# Patient Record
Sex: Female | Born: 1955
Health system: Southern US, Community
[De-identification: ages and names within clinical notes are randomized; demographics above are authoritative.]

## PROBLEM LIST (undated history)

## (undated) DIAGNOSIS — F32A Depression, unspecified: Secondary | ICD-10-CM

## (undated) DIAGNOSIS — F329 Major depressive disorder, single episode, unspecified: Secondary | ICD-10-CM

## (undated) DIAGNOSIS — J309 Allergic rhinitis, unspecified: Secondary | ICD-10-CM

## (undated) DIAGNOSIS — I10 Essential (primary) hypertension: Secondary | ICD-10-CM

## (undated) DIAGNOSIS — K219 Gastro-esophageal reflux disease without esophagitis: Secondary | ICD-10-CM

## (undated) DIAGNOSIS — N951 Menopausal and female climacteric states: Secondary | ICD-10-CM

## (undated) HISTORY — DX: Essential (primary) hypertension: I10

## (undated) HISTORY — DX: Major depressive disorder, single episode, unspecified: F32.9

## (undated) HISTORY — DX: Menopausal and female climacteric states: N95.1

## (undated) HISTORY — DX: Gastro-esophageal reflux disease without esophagitis: K21.9

## (undated) HISTORY — DX: Depression, unspecified: F32.A

## (undated) HISTORY — DX: Allergic rhinitis, unspecified: J30.9

---

## 1990-03-30 HISTORY — PX: OTHER SURGICAL HISTORY: SHX169

## 2008-08-23 ENCOUNTER — Ambulatory Visit: Payer: Self-pay

## 2009-02-07 ENCOUNTER — Ambulatory Visit: Payer: Self-pay | Admitting: Internal Medicine

## 2009-06-12 ENCOUNTER — Ambulatory Visit: Payer: Self-pay | Admitting: Gastroenterology

## 2009-06-21 ENCOUNTER — Ambulatory Visit: Payer: Self-pay | Admitting: Internal Medicine

## 2009-08-27 ENCOUNTER — Ambulatory Visit: Payer: Self-pay | Admitting: Internal Medicine

## 2009-08-29 ENCOUNTER — Ambulatory Visit: Payer: Self-pay | Admitting: Internal Medicine

## 2010-04-22 ENCOUNTER — Ambulatory Visit: Payer: Self-pay | Admitting: Internal Medicine

## 2010-10-30 ENCOUNTER — Ambulatory Visit: Payer: Self-pay | Admitting: Internal Medicine

## 2010-11-12 ENCOUNTER — Ambulatory Visit: Payer: Self-pay | Admitting: Internal Medicine

## 2010-11-17 ENCOUNTER — Ambulatory Visit: Payer: Self-pay | Admitting: Gastroenterology

## 2010-11-25 ENCOUNTER — Ambulatory Visit: Payer: Self-pay | Admitting: Internal Medicine

## 2011-02-13 ENCOUNTER — Ambulatory Visit: Payer: Self-pay | Admitting: Gastroenterology

## 2011-02-17 LAB — PATHOLOGY REPORT

## 2011-04-09 ENCOUNTER — Ambulatory Visit: Payer: Self-pay | Admitting: Internal Medicine

## 2011-08-25 ENCOUNTER — Ambulatory Visit: Payer: Self-pay

## 2011-09-28 ENCOUNTER — Ambulatory Visit: Payer: Self-pay

## 2011-12-23 ENCOUNTER — Ambulatory Visit: Payer: Self-pay | Admitting: Internal Medicine

## 2012-10-05 ENCOUNTER — Encounter: Payer: Self-pay | Admitting: Gynecology

## 2012-10-28 ENCOUNTER — Encounter: Payer: Self-pay | Admitting: Gynecology

## 2012-11-28 ENCOUNTER — Encounter: Payer: Self-pay | Admitting: Gynecology

## 2012-12-27 ENCOUNTER — Ambulatory Visit: Payer: Self-pay | Admitting: Internal Medicine

## 2014-01-18 ENCOUNTER — Ambulatory Visit: Payer: Self-pay | Admitting: Internal Medicine

## 2014-02-13 ENCOUNTER — Ambulatory Visit: Payer: Self-pay | Admitting: Internal Medicine

## 2014-03-30 HISTORY — PX: BREAST BIOPSY: SHX20

## 2014-07-13 ENCOUNTER — Other Ambulatory Visit: Payer: Self-pay | Admitting: Internal Medicine

## 2014-07-13 DIAGNOSIS — Z1231 Encounter for screening mammogram for malignant neoplasm of breast: Secondary | ICD-10-CM

## 2014-08-16 ENCOUNTER — Other Ambulatory Visit: Payer: Self-pay

## 2014-08-16 ENCOUNTER — Ambulatory Visit
Admission: RE | Admit: 2014-08-16 | Discharge: 2014-08-16 | Disposition: A | Discharge status: Home / Self Care | Payer: BLUE CROSS/BLUE SHIELD | Source: Ambulatory Visit | Attending: Internal Medicine | Admitting: Internal Medicine

## 2014-08-16 DIAGNOSIS — N63 Unspecified lump in breast: Secondary | ICD-10-CM | POA: Insufficient documentation

## 2014-08-16 DIAGNOSIS — Z1231 Encounter for screening mammogram for malignant neoplasm of breast: Secondary | ICD-10-CM

## 2014-08-16 DIAGNOSIS — R928 Other abnormal and inconclusive findings on diagnostic imaging of breast: Secondary | ICD-10-CM | POA: Diagnosis present

## 2014-08-20 ENCOUNTER — Other Ambulatory Visit: Payer: Self-pay | Admitting: Internal Medicine

## 2014-08-20 DIAGNOSIS — N63 Unspecified lump in unspecified breast: Secondary | ICD-10-CM

## 2014-08-20 DIAGNOSIS — R928 Other abnormal and inconclusive findings on diagnostic imaging of breast: Secondary | ICD-10-CM

## 2014-08-29 ENCOUNTER — Other Ambulatory Visit: Payer: Self-pay | Admitting: Internal Medicine

## 2014-08-29 DIAGNOSIS — N63 Unspecified lump in unspecified breast: Secondary | ICD-10-CM

## 2014-08-29 DIAGNOSIS — R928 Other abnormal and inconclusive findings on diagnostic imaging of breast: Secondary | ICD-10-CM

## 2014-09-03 ENCOUNTER — Other Ambulatory Visit: Payer: Self-pay | Admitting: Internal Medicine

## 2014-09-03 ENCOUNTER — Ambulatory Visit
Admission: RE | Admit: 2014-09-03 | Discharge: 2014-09-03 | Disposition: A | Discharge status: Home / Self Care | Payer: BLUE CROSS/BLUE SHIELD | Source: Ambulatory Visit | Attending: Internal Medicine | Admitting: Internal Medicine

## 2014-09-03 DIAGNOSIS — R928 Other abnormal and inconclusive findings on diagnostic imaging of breast: Secondary | ICD-10-CM

## 2014-09-03 DIAGNOSIS — N63 Unspecified lump in unspecified breast: Secondary | ICD-10-CM

## 2014-09-03 DIAGNOSIS — N6459 Other signs and symptoms in breast: Secondary | ICD-10-CM | POA: Insufficient documentation

## 2014-09-04 LAB — SURGICAL PATHOLOGY

## 2015-01-30 ENCOUNTER — Other Ambulatory Visit: Payer: Self-pay | Admitting: Physician Assistant

## 2015-01-30 DIAGNOSIS — R413 Other amnesia: Secondary | ICD-10-CM

## 2015-02-08 ENCOUNTER — Ambulatory Visit: Admission: RE | Admit: 2015-02-08 | Payer: BLUE CROSS/BLUE SHIELD | Source: Ambulatory Visit

## 2015-06-04 ENCOUNTER — Ambulatory Visit: Payer: BLUE CROSS/BLUE SHIELD

## 2015-08-13 ENCOUNTER — Other Ambulatory Visit: Payer: Self-pay | Admitting: Physician Assistant

## 2015-08-13 DIAGNOSIS — Z1231 Encounter for screening mammogram for malignant neoplasm of breast: Secondary | ICD-10-CM

## 2015-08-22 ENCOUNTER — Ambulatory Visit
Admission: RE | Admit: 2015-08-22 | Discharge: 2015-08-22 | Disposition: A | Discharge status: Home / Self Care | Payer: 59 | Source: Ambulatory Visit | Attending: Physician Assistant | Admitting: Physician Assistant

## 2015-08-22 DIAGNOSIS — Z1231 Encounter for screening mammogram for malignant neoplasm of breast: Secondary | ICD-10-CM | POA: Diagnosis present

## 2016-04-13 ENCOUNTER — Other Ambulatory Visit: Payer: Self-pay | Admitting: Nurse Practitioner

## 2016-04-13 DIAGNOSIS — Z1231 Encounter for screening mammogram for malignant neoplasm of breast: Secondary | ICD-10-CM

## 2016-08-04 ENCOUNTER — Other Ambulatory Visit: Payer: Self-pay | Admitting: Nurse Practitioner

## 2016-08-04 DIAGNOSIS — R1011 Right upper quadrant pain: Secondary | ICD-10-CM

## 2016-08-07 ENCOUNTER — Ambulatory Visit: Payer: BLUE CROSS/BLUE SHIELD

## 2016-08-10 ENCOUNTER — Ambulatory Visit: Payer: BLUE CROSS/BLUE SHIELD

## 2016-08-11 ENCOUNTER — Ambulatory Visit: Payer: BLUE CROSS/BLUE SHIELD

## 2016-08-25 ENCOUNTER — Ambulatory Visit
Admission: RE | Admit: 2016-08-25 | Discharge: 2016-08-25 | Disposition: A | Discharge status: Home / Self Care | Payer: BLUE CROSS/BLUE SHIELD | Source: Ambulatory Visit | Attending: Nurse Practitioner | Admitting: Nurse Practitioner

## 2016-08-25 ENCOUNTER — Ambulatory Visit: Payer: BLUE CROSS/BLUE SHIELD

## 2016-08-25 DIAGNOSIS — Z1231 Encounter for screening mammogram for malignant neoplasm of breast: Secondary | ICD-10-CM | POA: Insufficient documentation

## 2017-03-11 ENCOUNTER — Other Ambulatory Visit: Payer: Self-pay

## 2017-03-11 MED ORDER — CITALOPRAM HYDROBROMIDE 10 MG PO TABS
10.0000 mg | ORAL_TABLET | Freq: Every day | ORAL | 3 refills | Status: DC
Start: 2017-03-11 — End: 2017-12-09

## 2017-04-13 ENCOUNTER — Ambulatory Visit: Payer: 59 | Admitting: Nurse Practitioner

## 2017-04-13 ENCOUNTER — Encounter: Payer: Self-pay | Admitting: Nurse Practitioner

## 2017-04-13 ENCOUNTER — Other Ambulatory Visit: Payer: Self-pay

## 2017-04-13 VITALS — BP 140/80 | HR 74 | Resp 16 | Ht 65.0 in | Wt 159.2 lb

## 2017-04-13 DIAGNOSIS — Z0001 Encounter for general adult medical examination with abnormal findings: Secondary | ICD-10-CM | POA: Diagnosis not present

## 2017-04-13 DIAGNOSIS — R252 Cramp and spasm: Secondary | ICD-10-CM

## 2017-04-13 DIAGNOSIS — L409 Psoriasis, unspecified: Secondary | ICD-10-CM

## 2017-04-13 DIAGNOSIS — J3089 Other allergic rhinitis: Secondary | ICD-10-CM

## 2017-04-13 DIAGNOSIS — E039 Hypothyroidism, unspecified: Secondary | ICD-10-CM

## 2017-04-13 DIAGNOSIS — Z124 Encounter for screening for malignant neoplasm of cervix: Secondary | ICD-10-CM | POA: Diagnosis not present

## 2017-04-13 DIAGNOSIS — E559 Vitamin D deficiency, unspecified: Secondary | ICD-10-CM | POA: Diagnosis not present

## 2017-04-13 DIAGNOSIS — N950 Postmenopausal bleeding: Secondary | ICD-10-CM | POA: Diagnosis not present

## 2017-04-13 DIAGNOSIS — G47419 Narcolepsy without cataplexy: Secondary | ICD-10-CM

## 2017-04-13 MED ORDER — MAGNESIUM 200 MG PO TABS: 200.0000 mg | ORAL_TABLET | Freq: Two times a day (BID) | ORAL | 4 refills | Status: DC

## 2017-04-13 MED ORDER — CETIRIZINE HCL 10 MG PO TABS: 10.0000 mg | ORAL_TABLET | Freq: Every day | ORAL | 11 refills | Status: DC

## 2017-04-13 MED ORDER — CONJ ESTROG-MEDROXYPROGEST ACE 0.625-2.5 MG PO TABS: 1.0000 | ORAL_TABLET | Freq: Every day | ORAL | 5 refills | Status: DC

## 2017-04-13 MED ORDER — POTASSIUM 99 MG PO TABS: 99.0000 mg | ORAL_TABLET | Freq: Two times a day (BID) | ORAL | 4 refills | Status: DC

## 2017-04-13 MED ORDER — MODAFINIL 200 MG PO TABS: 200.0000 mg | ORAL_TABLET | Freq: Every day | ORAL | 3 refills | Status: DC

## 2017-04-13 MED ORDER — VITAMIN D 50 MCG (2000 UT) PO CAPS: 2000.0000 [IU] | ORAL_CAPSULE | Freq: Two times a day (BID) | ORAL | 4 refills | Status: DC

## 2017-04-13 MED ORDER — CLOBETASOL PROPIONATE 0.05 % EX SHAM: MEDICATED_SHAMPOO | CUTANEOUS | 5 refills | Status: DC

## 2017-04-13 NOTE — Progress Notes (Signed)
[facility] [place], [place] [address]  Internal MEDICINE  Office Visit Note  Patient Name: [name] [name]  [id number]  [id number]  Date of Service: 04/13/2017  Chief Complaint  Patient presents with  . Other    swelling in right hand  . Gynecologic Exam  . Vaginal Bleeding    clot formation noted. post-menopausal for over 4 years      Gynecologic Exam  The patient's primary symptoms include pelvic pain and vaginal bleeding. This is a recurrent problem. The current episode started more than 1 month ago. The problem occurs every several days. The problem has been gradually worsening. The pain is mild. The problem affects both sides. She is not pregnant. Associated symptoms include abdominal pain. The vaginal bleeding is spotting (episodes of clots present ). She has been passing clots. She has not been passing tissue. Nothing aggravates the symptoms. She has tried nothing for the symptoms. She is sexually active (married ). No, her partner does not have an STD. She uses nothing for contraception. She is postmenopausal.  Vaginal Bleeding  The patient's primary symptoms include pelvic pain and vaginal bleeding. Associated symptoms include abdominal pain.   Pt is here for routine health maintenance examination  Current Medication: Outpatient Encounter Medications as of 04/13/2017  Medication Sig  . aspirin EC 81 MG tablet Take 81 mg by mouth daily.  . Cholecalciferol (VITAMIN D) 2000 units CAPS Take by mouth 2 (two) times daily.  . citalopram (CELEXA) 10 MG tablet Take 1 tablet (10 mg total) by mouth daily.  . Clobetasol Propionate 0.05 % shampoo Apply topically 2 (two) times a week.  . conjugated estrogens (PREMARIN) vaginal cream Place vaginally.  Marland Kitchen esomeprazole (NEXIUM) 40 MG capsule Take by mouth.  . flurandrenolide (CORDRAN) 0.05 % lotion Apply topically 2 (two) times a week. Use as directed  . Magnesium 200 MG TABS Take by mouth 2 (two) times daily.   . mirabegron ER (MYRBETRIQ) 50 MG TB24 tablet Take by mouth.  . modafinil (PROVIGIL) 200 MG tablet Take by mouth.  . mometasone (NASONEX) 50 MCG/ACT nasal spray Place into the nose.  Marland Kitchen Potassium 99 MG TABS Take by mouth 2 (two) times daily.   No facility-administered encounter medications on file as of 04/13/2017.     Surgical History: Past Surgical History:  Procedure Laterality Date  . BREAST BIOPSY Right 2016   cor bx   benign  . ruptured disc  1992    Medical History: Past Medical History:  Diagnosis Date  . Allergic rhinitis   . Depression   . GERD (gastroesophageal reflux disease)   . Hypertension   . Postmenopausal disorder     Family History: Family History  Problem Relation Age of Onset  . Asthma Mother   . Diabetes Mother   . Hypertension Mother   . Stroke Mother   . Epilepsy Mother   . Coronary artery disease Mother   . Breast cancer Neg Hx       Review of Systems  Constitutional: Negative.   HENT: Negative.   Respiratory: Negative.   Gastrointestinal: Positive for abdominal pain.  Endocrine: Negative.   Genitourinary: Positive for pelvic pain and vaginal bleeding.  Musculoskeletal: Negative.   Skin: Negative.   Allergic/Immunologic: Negative.   Neurological: Negative.   Hematological: Negative.   Psychiatric/Behavioral: Negative.      Today's Vitals   04/13/17 0934  BP: 140/80  Pulse: 74  Resp: 16  SpO2: 99%  Weight: 159 lb  3.2 oz (72.2 kg)  Height: 5\' 5"  (1.651 m)    Physical Exam  Constitutional: She is oriented to person, place, and time. She appears well-developed and well-nourished.  HENT:  Head: Normocephalic and atraumatic.  Eyes: Conjunctivae and EOM are normal. Pupils are equal, round, and reactive to light.  Neck: Normal range of motion. Neck supple. Carotid bruit is not present. No thyromegaly present.  Cardiovascular: Normal rate, regular rhythm, normal heart sounds, intact distal pulses and normal pulses.   Pulmonary/Chest: Effort normal and breath sounds normal. No respiratory distress. She has no wheezes. She exhibits no tenderness. Right breast exhibits no inverted nipple, no mass, no nipple discharge, no skin change and no tenderness. Left breast exhibits no inverted nipple, no mass, no nipple discharge, no skin change and no tenderness.  Abdominal: Soft. Bowel sounds are normal. There is no tenderness.  Genitourinary: Vagina normal and uterus normal. Pelvic exam was performed with patient prone. Right adnexum displays no mass, no tenderness and no fullness. Left adnexum displays no mass, no tenderness and no fullness.  Musculoskeletal: Normal range of motion.  Lymphadenopathy:    She has no cervical adenopathy.  Neurological: She is alert and oriented to person, place, and time.  Skin: Skin is warm and dry.  Psychiatric: She has a normal mood and affect.  Nursing note and vitals reviewed.    Assessment/Plan:   1. Encounter for general adult medical examination with abnormal findings Annual wellness visit today - Urinalysis, Routine w reflex microscopic - CBC with Differential/Platelet - Comprehensive metabolic panel - Lipid panel  2. Post-menopausal bleeding - US Transvaginal Non-OB; Future - CBC with Differential/Platelet - FSH/LH - Estradiol - Prolactin - estrogen, conjugated,-medroxyprogesterone (PREMPRO) 0.625-2.5 MG tablet; Take 1 tablet by mouth daily.  Dispense: 30 tablet; Refill: 5  3. Primary narcolepsy without cataplexy - modafinil (PROVIGIL) 200 MG tablet; Take 1 tablet (200 mg total) by mouth daily.  Dispense: 30 tablet; Refill: 3  4. Routine cervical smear - Pap IG and HPV (high risk) DNA detection  5. Acquired hypothyroidism - T4, free - TSH  6. Vitamin D deficiency - Vitamin D 1,25 dihydroxy - Cholecalciferol (VITAMIN D) 2000 units CAPS; Take 1 capsule (2,000 Units total) by mouth 2 (two) times daily.  Dispense: 90 capsule; Refill: 4  7. Allergic  rhinitis due to other allergic trigger, unspecified seasonality - cetirizine (ZYRTEC) 10 MG tablet; Take 1 tablet (10 mg total) by mouth daily.  Dispense: 90 tablet; Refill: 11  8. Cramp and spasm - Magnesium 200 MG TABS; Take 1 tablet (200 mg total) by mouth 2 (two) times daily.  Dispense: 90 each; Refill: 4 - Potassium 99 MG TABS; Take 1 tablet (99 mg total) by mouth 2 (two) times daily.  Dispense: 90 each; Refill: 4  9. Psoriasis of scalp - Clobetasol Propionate 0.05 % shampoo; Use as directed twice weekly prn  Dispense: 118 mL; Refill: 5   General Counseling: Chan verbalizes understanding of the findings of todays visit and agrees with plan of treatment. I have discussed any further diagnostic evaluation that may be needed or ordered today. We also reviewed her medications today. she has been encouraged to call the office with any questions or concerns that should arise related to todays visit.   This patient was seen by Leretha Pol, FNP- C in Collaboration with Dr Lavera Guise as a part of collaborative care agreement    Orders Placed This Encounter  Procedures  . Urinalysis, Routine w reflex microscopic  Time spent: [place], MD  Internal Medicine

## 2017-04-15 LAB — PAP IG AND HPV HIGH-RISK
HPV, high-risk: NEGATIVE
PAP Smear Comment: 0

## 2017-04-17 LAB — URINALYSIS, ROUTINE W REFLEX MICROSCOPIC

## 2017-04-22 DIAGNOSIS — Z0001 Encounter for general adult medical examination with abnormal findings: Secondary | ICD-10-CM | POA: Diagnosis not present

## 2017-04-22 DIAGNOSIS — E039 Hypothyroidism, unspecified: Secondary | ICD-10-CM | POA: Diagnosis not present

## 2017-04-22 DIAGNOSIS — N950 Postmenopausal bleeding: Secondary | ICD-10-CM | POA: Diagnosis not present

## 2017-04-27 LAB — PROLACTIN: Prolactin: 14 ng/mL (ref 4.8–23.3)

## 2017-04-27 LAB — T4, FREE: Free T4: 1 ng/dL (ref 0.82–1.77)

## 2017-04-27 LAB — CBC WITH DIFFERENTIAL/PLATELET
Basophils Absolute: 0 10*3/uL (ref 0.0–0.2)
Basos: 1 %
EOS (ABSOLUTE): 0.2 10*3/uL (ref 0.0–0.4)
Eos: 3 %
Hematocrit: 38.9 % (ref 34.0–46.6)
Hemoglobin: 12.7 g/dL (ref 11.1–15.9)
Immature Grans (Abs): 0 10*3/uL (ref 0.0–0.1)
Immature Granulocytes: 0 %
LYMPHS ABS: 2 10*3/uL (ref 0.7–3.1)
Lymphs: 37 %
MCH: 31.6 pg (ref 26.6–33.0)
MCHC: 32.6 g/dL (ref 31.5–35.7)
MCV: 97 fL (ref 79–97)
MONOS ABS: 0.5 10*3/uL (ref 0.1–0.9)
Monocytes: 9 %
Neutrophils Absolute: 2.8 10*3/uL (ref 1.4–7.0)
Neutrophils: 50 %
PLATELETS: 274 10*3/uL (ref 150–379)
RBC: 4.02 x10E6/uL (ref 3.77–5.28)
RDW: 12 % — AB (ref 12.3–15.4)
WBC: 5.4 10*3/uL (ref 3.4–10.8)

## 2017-04-27 LAB — COMPREHENSIVE METABOLIC PANEL
A/G RATIO: 1.7 (ref 1.2–2.2)
ALBUMIN: 4.3 g/dL (ref 3.6–4.8)
ALT: 8 IU/L (ref 0–32)
AST: 19 IU/L (ref 0–40)
Alkaline Phosphatase: 60 IU/L (ref 39–117)
BUN/Creatinine Ratio: 13 (ref 12–28)
BUN: 10 mg/dL (ref 8–27)
Bilirubin Total: 0.7 mg/dL (ref 0.0–1.2)
CALCIUM: 9.5 mg/dL (ref 8.7–10.3)
CO2: 24 mmol/L (ref 20–29)
CREATININE: 0.79 mg/dL (ref 0.57–1.00)
Chloride: 101 mmol/L (ref 96–106)
GFR, EST AFRICAN AMERICAN: 92 mL/min/{1.73_m2} (ref >59)
GFR, EST NON AFRICAN AMERICAN: 80 mL/min/{1.73_m2} (ref >59)
GLOBULIN, TOTAL: 2.6 g/dL (ref 1.5–4.5)
Glucose: 99 mg/dL (ref 65–99)
POTASSIUM: 4 mmol/L (ref 3.5–5.2)
SODIUM: 140 mmol/L (ref 134–144)
TOTAL PROTEIN: 6.9 g/dL (ref 6.0–8.5)

## 2017-04-27 LAB — LIPID PANEL
CHOLESTEROL TOTAL: 200 mg/dL — AB (ref 100–199)
Chol/HDL Ratio: 2.9 ratio (ref 0.0–4.4)
HDL: 69 mg/dL (ref >39)
LDL CALC: 112 mg/dL — AB (ref 0–99)
Triglycerides: 93 mg/dL (ref 0–149)
VLDL CHOLESTEROL CAL: 19 mg/dL (ref 5–40)

## 2017-04-27 LAB — VITAMIN D 1,25 DIHYDROXY
VITAMIN D 1, 25 (OH) TOTAL: 46 pg/mL
Vitamin D3 1, 25 (OH)2: 45 pg/mL

## 2017-04-27 LAB — TSH: TSH: 1.35 u[IU]/mL (ref 0.450–4.500)

## 2017-04-27 LAB — FSH/LH
FSH: 49.7 m[IU]/mL
LH: 45.1 m[IU]/mL

## 2017-04-27 LAB — ESTRADIOL: ESTRADIOL: 45.3 pg/mL

## 2017-05-03 ENCOUNTER — Ambulatory Visit: Payer: 59

## 2017-05-03 DIAGNOSIS — N950 Postmenopausal bleeding: Secondary | ICD-10-CM

## 2017-05-05 ENCOUNTER — Other Ambulatory Visit: Payer: Self-pay

## 2017-05-05 MED ORDER — MIRABEGRON ER 50 MG PO TB24: 50.0000 mg | ORAL_TABLET | Freq: Every day | ORAL | 1 refills | Status: DC

## 2017-05-17 ENCOUNTER — Ambulatory Visit: Payer: 59 | Admitting: Nurse Practitioner

## 2017-05-17 ENCOUNTER — Encounter: Payer: Self-pay | Admitting: Nurse Practitioner

## 2017-05-17 VITALS — BP 127/81 | HR 88 | Resp 16 | Ht 65.0 in | Wt 155.0 lb

## 2017-05-17 DIAGNOSIS — N3281 Overactive bladder: Secondary | ICD-10-CM

## 2017-05-17 DIAGNOSIS — N83201 Unspecified ovarian cyst, right side: Secondary | ICD-10-CM

## 2017-05-17 DIAGNOSIS — N959 Unspecified menopausal and perimenopausal disorder: Secondary | ICD-10-CM

## 2017-05-17 DIAGNOSIS — N83202 Unspecified ovarian cyst, left side: Secondary | ICD-10-CM

## 2017-05-17 DIAGNOSIS — R9389 Abnormal findings on diagnostic imaging of other specified body structures: Secondary | ICD-10-CM | POA: Diagnosis not present

## 2017-05-17 DIAGNOSIS — N950 Postmenopausal bleeding: Secondary | ICD-10-CM | POA: Diagnosis not present

## 2017-05-17 MED ORDER — FESOTERODINE FUMARATE ER 8 MG PO TB24: 8.0000 mg | ORAL_TABLET | Freq: Every day | ORAL | 3 refills | Status: DC

## 2017-05-17 MED ORDER — ESTROGENS, CONJUGATED 0.625 MG/GM VA CREA: TOPICAL_CREAM | VAGINAL | 4 refills | Status: DC

## 2017-05-17 MED ORDER — CONJ ESTROG-MEDROXYPROGEST ACE 0.625-2.5 MG PO TABS: 1.0000 | ORAL_TABLET | Freq: Every day | ORAL | 4 refills | Status: DC

## 2017-05-17 NOTE — Progress Notes (Signed)
[facility] [place], [place] [address]  Internal MEDICINE  Office Visit Note  Patient Name: [name] [name]  [id number]  [id number]  Date of Service: 05/26/2017  Chief Complaint  Patient presents with  . Abdominal Pain    painful intercourse, and post-menopausal bleeding    The patient is here for follow up exam. She was having post-menopausal bleeding and pelvic pain with intercourse. She had labs and pelvic ultrasound done since her last visit. She is here to discuss results.    Abdominal Pain  This is a recurrent problem. The current episode started more than 1 month ago. The onset quality is gradual. The problem occurs intermittently. The problem has been gradually improving. The pain is located in the suprapubic region. The pain is mild. The quality of the pain is aching and cramping. The abdominal pain does not radiate. Associated symptoms include frequency. Pertinent negatives include no arthralgias. Associated symptoms comments: Urgency, dyspareunia. Nothing aggravates the pain. The pain is relieved by nothing. Treatments tried: oral and topical hormone treatments. The treatment provided moderate relief. Prior diagnostic workup includes ultrasound (lab work). Her past medical history is significant for GERD.    Pt is here for routine follow up.    Current Medication: Outpatient Encounter Medications as of 05/17/2017  Medication Sig  . aspirin EC 81 MG tablet Take 81 mg by mouth daily.  . cetirizine (ZYRTEC) 10 MG tablet Take 1 tablet (10 mg total) by mouth daily.  . Cholecalciferol (VITAMIN D) 2000 units CAPS Take 1 capsule (2,000 Units total) by mouth 2 (two) times daily.  . citalopram (CELEXA) 10 MG tablet Take 1 tablet (10 mg total) by mouth daily.  . Clobetasol Propionate 0.05 % shampoo Use as directed twice weekly prn  . conjugated estrogens (PREMARIN) vaginal cream Use 1 applicatorful vaginally 2 times weekly  . esomeprazole (NEXIUM) 40 MG  capsule Take by mouth.  . estrogen, conjugated,-medroxyprogesterone (PREMPRO) 0.625-2.5 MG tablet Take 1 tablet by mouth daily.  . fesoterodine (TOVIAZ) 8 MG TB24 tablet Take 1 tablet (8 mg total) by mouth daily.  . flurandrenolide (CORDRAN) 0.05 % lotion Apply topically 2 (two) times a week. Use as directed  . Magnesium 200 MG TABS Take 1 tablet (200 mg total) by mouth 2 (two) times daily.  . mirabegron ER (MYRBETRIQ) 50 MG TB24 tablet Take 1 tablet (50 mg total) by mouth daily.  . modafinil (PROVIGIL) 200 MG tablet Take 1 tablet (200 mg total) by mouth daily.  . mometasone (NASONEX) 50 MCG/ACT nasal spray Place into the nose.  Marland Kitchen Potassium 99 MG TABS Take 1 tablet (99 mg total) by mouth 2 (two) times daily.  . [DISCONTINUED] conjugated estrogens (PREMARIN) vaginal cream Place vaginally.  . [DISCONTINUED] estrogen, conjugated,-medroxyprogesterone (PREMPRO) 0.625-2.5 MG tablet Take 1 tablet by mouth daily.   No facility-administered encounter medications on file as of 05/17/2017.     Surgical History: Past Surgical History:  Procedure Laterality Date  . BREAST BIOPSY Right 2016   cor bx   benign  . ruptured disc  1992    Medical History: Past Medical History:  Diagnosis Date  . Allergic rhinitis   . Depression   . GERD (gastroesophageal reflux disease)   . Hypertension   . Postmenopausal disorder     Family History: Family History  Problem Relation Age of Onset  . Asthma Mother   . Diabetes Mother   . Hypertension Mother   . Stroke Mother   . Epilepsy Mother   .  Coronary artery disease Mother   . Breast cancer Neg Hx     Social History   Socioeconomic History  . Marital status: Single    Spouse name: Not on file  . Number of children: Not on file  . Years of education: Not on file  . Highest education level: Not on file  Social Needs  . Financial resource strain: Not on file  . Food insecurity - worry: Not on file  . Food insecurity - inability: Not on file  .  Transportation needs - medical: Not on file  . Transportation needs - non-medical: Not on file  Occupational History  . Not on file  Tobacco Use  . Smoking status: Never Smoker  . Smokeless tobacco: Never Used  Substance and Sexual Activity  . Alcohol use: Yes    Frequency: Never    Comment: social  . Drug use: Yes    Types: "Crack" cocaine  . Sexual activity: Not on file  Other Topics Concern  . Not on file  Social History Narrative  . Not on file      Review of Systems  Constitutional: Positive for fatigue. Negative for activity change and unexpected weight change.  HENT: Negative for congestion, postnasal drip, rhinorrhea and sore throat.   Eyes: Negative.   Respiratory: Negative for cough and wheezing.   Cardiovascular: Negative for chest pain and palpitations.  Gastrointestinal: Positive for abdominal pain.  Endocrine: Negative for cold intolerance, heat intolerance, polydipsia, polyphagia and polyuria.  Genitourinary: Positive for dyspareunia, frequency, pelvic pain, urgency and vaginal bleeding.  Musculoskeletal: Negative for arthralgias and back pain.  Skin: Negative for rash.  Allergic/Immunologic: Negative.   Neurological: Negative.   Hematological: Negative.   Psychiatric/Behavioral: Negative.     Today's Vitals   05/17/17 1459  BP: 127/81  Pulse: 88  Resp: 16  SpO2: 95%  Weight: 155 lb (70.3 kg)  Height: 5\' 5"  (1.651 m)    Physical Exam  Nursing note and vitals reviewed. Constitutional: She is oriented to person, place, and time. She appears well-developed and well-nourished.  HENT:  Head: Normocephalic.  Eyes: Pupils are equal, round, and reactive to light.  Neck: Normal range of motion. Neck supple. No thyromegaly present.  Cardiovascular: Normal rate, regular rhythm and normal heart sounds.  Respiratory: Effort normal and breath sounds normal. She has no wheezes.  GI: Soft. Bowel sounds are normal. There is no tenderness.  Neurological: She is  alert and oriented to person, place, and time. No cranial nerve deficit.  Skin: Skin is warm and dry.  Psychiatric: She has a normal mood and affect. Her behavior is normal. Judgment and thought content normal.    Assessment/Plan:  1. Cysts of both ovaries Reviewed u/s pelvis. Cyst on right ovary 1.8cm and cyst of left ovary 2.5cm. Refer to GYN for further evaluation.   2. Increased endometrial stripe thickness Mildly thickened endometrial lining. Refer to GYN for further evaluation.   3. Post-menopausal bleeding - estrogen, conjugated,-medroxyprogesterone (PREMPRO) 0.625-2.5 MG tablet; Take 1 tablet by mouth daily.  Dispense: 90 tablet; Refill: 4 - Ambulatory referral to Gynecology  4. Unspecified menopausal and perimenopausal disorder - conjugated estrogens (PREMARIN) vaginal cream; Use 1 applicatorful vaginally 2 times weekly  Dispense: 90 g; Refill: 4 - Ambulatory referral to Gynecology  5. Overactive bladder - fesoterodine (TOVIAZ) 8 MG TB24 tablet; Take 1 tablet (8 mg total) by mouth daily.  Dispense: 30 tablet; Refill: 3  General Counseling: Sandy Salaam understanding of the findings of todays  visit and agrees with plan of treatment. I have discussed any further diagnostic evaluation that may be needed or ordered today. We also reviewed her medications today. she has been encouraged to call the office with any questions or concerns that should arise related to todays visit.  This patient was seen by Leretha Pol, FNP- C in Collaboration with Dr Lavera Guise as a part of collaborative care agreement    Orders Placed This Encounter  Procedures  . Ambulatory referral to Gynecology    Meds ordered this encounter  Medications  . conjugated estrogens (PREMARIN) vaginal cream    Sig: Use 1 applicatorful vaginally 2 times weekly    Dispense:  90 g    Refill:  4    Order Specific Question:   Supervising Provider    Answer:   Lavera Guise [[id number]]  . estrogen,  conjugated,-medroxyprogesterone (PREMPRO) 0.625-2.5 MG tablet    Sig: Take 1 tablet by mouth daily.    Dispense:  90 tablet    Refill:  4    Order Specific Question:   Supervising Provider    Answer:   Lavera Guise [[id number]]  . fesoterodine (TOVIAZ) 8 MG TB24 tablet    Sig: Take 1 tablet (8 mg total) by mouth daily.    Dispense:  30 tablet    Refill:  3    Preferred per patient's insurance    Order Specific Question:   Supervising Provider    Answer:   Lavera Guise [[id number]]    Time spent: 64 Minutes  Dr Lavera Guise Internal medicine

## 2017-05-17 NOTE — Progress Notes (Signed)
Patient has been advised that prior authorization for Myrbetriq has been denied. Patient must try/failure Toviaz. [place]

## 2017-05-22 DIAGNOSIS — J0191 Acute recurrent sinusitis, unspecified: Secondary | ICD-10-CM | POA: Diagnosis not present

## 2017-05-26 DIAGNOSIS — N3281 Overactive bladder: Secondary | ICD-10-CM | POA: Insufficient documentation

## 2017-05-26 DIAGNOSIS — N959 Unspecified menopausal and perimenopausal disorder: Secondary | ICD-10-CM | POA: Insufficient documentation

## 2017-05-26 DIAGNOSIS — N83201 Unspecified ovarian cyst, right side: Secondary | ICD-10-CM | POA: Insufficient documentation

## 2017-05-26 DIAGNOSIS — R9389 Abnormal findings on diagnostic imaging of other specified body structures: Secondary | ICD-10-CM | POA: Insufficient documentation

## 2017-05-26 DIAGNOSIS — N83202 Unspecified ovarian cyst, left side: Principal | ICD-10-CM

## 2017-05-26 DIAGNOSIS — N950 Postmenopausal bleeding: Secondary | ICD-10-CM | POA: Insufficient documentation

## 2017-06-01 ENCOUNTER — Telehealth: Payer: Self-pay | Admitting: Obstetrics & Gynecology

## 2017-06-01 NOTE — Telephone Encounter (Signed)
[facility] referring for Large Ovarian cyst post menopausal Bleeding.Called and Left voicemail for patient to call back to be schedul

## 2017-06-02 ENCOUNTER — Telehealth: Payer: Self-pay

## 2017-06-02 ENCOUNTER — Other Ambulatory Visit: Payer: Self-pay | Admitting: Internal Medicine

## 2017-06-02 DIAGNOSIS — K219 Gastro-esophageal reflux disease without esophagitis: Secondary | ICD-10-CM

## 2017-06-02 NOTE — Telephone Encounter (Signed)
LMOM ASKING PT TO CALL BACK TO DISCUSS REFLUX AND GET ADVISED ON UGI SCHD PER DR [facility] FOR 06/08/17 @ 8:30 AND FOLLOW UP WITH [facility] ON 06/09/17/ BR

## 2017-06-03 ENCOUNTER — Telehealth: Payer: Self-pay

## 2017-06-03 DIAGNOSIS — R1310 Dysphagia, unspecified: Secondary | ICD-10-CM | POA: Diagnosis not present

## 2017-06-03 DIAGNOSIS — K219 Gastro-esophageal reflux disease without esophagitis: Secondary | ICD-10-CM | POA: Diagnosis not present

## 2017-06-03 DIAGNOSIS — R490 Dysphonia: Secondary | ICD-10-CM | POA: Diagnosis not present

## 2017-06-03 NOTE — Telephone Encounter (Signed)
Spoke with patient regarding reflux/heartburn and upper belly area pain, per dr Humphrey Rolls request ugi which was scheduled and wanted ro see patient next week, per patient not very pleased and wanted something a few weeks ago and overall not happy and will be searching for a new primary care provider. br

## 2017-06-08 ENCOUNTER — Encounter: Payer: Self-pay | Admitting: Obstetrics and Gynecology

## 2017-06-08 ENCOUNTER — Other Ambulatory Visit: Payer: BLUE CROSS/BLUE SHIELD

## 2017-06-17 DIAGNOSIS — M79644 Pain in right finger(s): Secondary | ICD-10-CM | POA: Diagnosis not present

## 2017-06-21 DIAGNOSIS — K219 Gastro-esophageal reflux disease without esophagitis: Secondary | ICD-10-CM | POA: Diagnosis not present

## 2017-06-21 DIAGNOSIS — R12 Heartburn: Secondary | ICD-10-CM | POA: Diagnosis not present

## 2017-07-08 DIAGNOSIS — N838 Other noninflammatory disorders of ovary, fallopian tube and broad ligament: Secondary | ICD-10-CM | POA: Insufficient documentation

## 2017-07-08 DIAGNOSIS — R350 Frequency of micturition: Secondary | ICD-10-CM | POA: Diagnosis not present

## 2017-07-27 ENCOUNTER — Other Ambulatory Visit: Payer: Self-pay | Admitting: Internal Medicine

## 2017-07-27 ENCOUNTER — Other Ambulatory Visit: Payer: Self-pay | Admitting: Nurse Practitioner

## 2017-07-27 DIAGNOSIS — Z1231 Encounter for screening mammogram for malignant neoplasm of breast: Secondary | ICD-10-CM

## 2017-07-27 DIAGNOSIS — R350 Frequency of micturition: Secondary | ICD-10-CM | POA: Diagnosis not present

## 2017-08-02 ENCOUNTER — Other Ambulatory Visit: Payer: Self-pay | Admitting: Nurse Practitioner

## 2017-08-02 DIAGNOSIS — N3281 Overactive bladder: Secondary | ICD-10-CM

## 2017-08-02 DIAGNOSIS — G47419 Narcolepsy without cataplexy: Secondary | ICD-10-CM

## 2017-08-02 MED ORDER — FESOTERODINE FUMARATE ER 8 MG PO TB24: 8.0000 mg | ORAL_TABLET | Freq: Every day | ORAL | 1 refills | Status: DC

## 2017-08-02 MED ORDER — MODAFINIL 200 MG PO TABS: 200.0000 mg | ORAL_TABLET | Freq: Every day | ORAL | 1 refills | Status: DC

## 2017-08-11 ENCOUNTER — Other Ambulatory Visit: Payer: Self-pay | Admitting: Gastroenterology

## 2017-08-11 DIAGNOSIS — R110 Nausea: Secondary | ICD-10-CM | POA: Diagnosis not present

## 2017-08-11 DIAGNOSIS — R1013 Epigastric pain: Secondary | ICD-10-CM | POA: Diagnosis not present

## 2017-08-11 DIAGNOSIS — N83202 Unspecified ovarian cyst, left side: Secondary | ICD-10-CM | POA: Diagnosis not present

## 2017-08-11 DIAGNOSIS — R140 Abdominal distension (gaseous): Secondary | ICD-10-CM | POA: Diagnosis not present

## 2017-08-11 DIAGNOSIS — N83201 Unspecified ovarian cyst, right side: Secondary | ICD-10-CM | POA: Diagnosis not present

## 2017-08-16 ENCOUNTER — Ambulatory Visit
Admission: RE | Admit: 2017-08-16 | Discharge: 2017-08-16 | Disposition: A | Discharge status: Home / Self Care | Payer: 59 | Source: Ambulatory Visit | Attending: Gastroenterology | Admitting: Gastroenterology

## 2017-08-16 DIAGNOSIS — R1013 Epigastric pain: Secondary | ICD-10-CM

## 2017-08-16 DIAGNOSIS — K219 Gastro-esophageal reflux disease without esophagitis: Secondary | ICD-10-CM | POA: Diagnosis not present

## 2017-08-16 MED ORDER — IOPAMIDOL (ISOVUE-300) INJECTION 61%
100.0000 mL | Freq: Once | INTRAVENOUS | Status: AC | PRN
  Administered 2017-08-16: 100 mL via INTRAVENOUS

## 2017-08-31 ENCOUNTER — Telehealth: Payer: Self-pay | Admitting: Internal Medicine

## 2017-08-31 ENCOUNTER — Other Ambulatory Visit: Payer: Self-pay | Admitting: Internal Medicine

## 2017-08-31 DIAGNOSIS — N950 Postmenopausal bleeding: Secondary | ICD-10-CM

## 2017-08-31 MED ORDER — CONJ ESTROG-MEDROXYPROGEST ACE 0.625-2.5 MG PO TABS: 1.0000 | ORAL_TABLET | Freq: Every day | ORAL | 4 refills | Status: DC

## 2017-08-31 NOTE — Telephone Encounter (Signed)
Pt called requesting refill of prempro to be sent to [facility] on [address]. 90day supply was sent in with 4 refills.

## 2017-09-07 ENCOUNTER — Ambulatory Visit
Admission: RE | Admit: 2017-09-07 | Discharge: 2017-09-07 | Disposition: A | Discharge status: Home / Self Care | Payer: 59 | Source: Ambulatory Visit | Attending: Internal Medicine | Admitting: Internal Medicine

## 2017-09-07 DIAGNOSIS — Z1231 Encounter for screening mammogram for malignant neoplasm of breast: Secondary | ICD-10-CM

## 2017-09-14 ENCOUNTER — Ambulatory Visit: Payer: Self-pay | Admitting: Nurse Practitioner

## 2017-09-23 DIAGNOSIS — R490 Dysphonia: Secondary | ICD-10-CM | POA: Diagnosis not present

## 2017-09-23 DIAGNOSIS — K219 Gastro-esophageal reflux disease without esophagitis: Secondary | ICD-10-CM | POA: Diagnosis not present

## 2017-09-23 DIAGNOSIS — R6881 Early satiety: Secondary | ICD-10-CM | POA: Diagnosis not present

## 2017-10-07 DIAGNOSIS — K219 Gastro-esophageal reflux disease without esophagitis: Secondary | ICD-10-CM | POA: Insufficient documentation

## 2017-10-07 DIAGNOSIS — R1314 Dysphagia, pharyngoesophageal phase: Secondary | ICD-10-CM | POA: Insufficient documentation

## 2017-10-07 DIAGNOSIS — R490 Dysphonia: Secondary | ICD-10-CM | POA: Insufficient documentation

## 2017-10-11 ENCOUNTER — Encounter: Payer: Self-pay | Admitting: Pediatrics

## 2017-11-08 ENCOUNTER — Encounter: Payer: Self-pay | Admitting: Allergy & Immunology

## 2017-11-08 ENCOUNTER — Ambulatory Visit (INDEPENDENT_AMBULATORY_CARE_PROVIDER_SITE_OTHER): Payer: 59 | Admitting: Allergy & Immunology

## 2017-11-08 VITALS — BP 142/88 | HR 91 | Temp 98.2°F | Resp 16 | Ht 63.75 in | Wt 149.4 lb

## 2017-11-08 DIAGNOSIS — R490 Dysphonia: Secondary | ICD-10-CM

## 2017-11-08 DIAGNOSIS — E507 Other ocular manifestations of vitamin A deficiency: Secondary | ICD-10-CM

## 2017-11-08 DIAGNOSIS — R682 Dry mouth, unspecified: Secondary | ICD-10-CM | POA: Diagnosis not present

## 2017-11-08 DIAGNOSIS — K117 Disturbances of salivary secretion: Secondary | ICD-10-CM

## 2017-11-08 MED ORDER — CARBINOXAMINE MALEATE 6 MG PO TABS: 6.0000 mg | ORAL_TABLET | Freq: Four times a day (QID) | ORAL | 1 refills | Status: DC | PRN

## 2017-11-08 MED ORDER — IPRATROPIUM BROMIDE 0.06 % NA SOLN: NASAL | 1 refills | Status: DC

## 2017-11-08 NOTE — Progress Notes (Addendum)
NEW PATIENT  Date of Service/Encounter:  11/08/17  Referring provider: Lavera Guise, MD   Assessment:   Hoarseness - with a history of GERD (on PPI) and vocal cord thickening  Xerostomia and xerophthalmia    History of discoid lupus - followed by dermatology   [name] [name] is a very delightful 62 year old female presenting for a history of chronic hoarseness and postnasal drip.  She has been seen by gastroenterology as well as otolaryngology without any particular etiology appreciated.  She is on a proton pump inhibitor, which she has remained on for quite some time.  She is also on Flonase a few times a week as well as Zyrtec daily.  These do not seem to be controlling her postnasal drip.  She has been allergy tested in the past, and declines allergy testing today.  She was on shots for 1 year several decades ago, without apparent improvement.  Interestingly, she is also endorsing symptoms of xerophthalmia and xerostomia.  She has a history of discoid lupus, therefore she has a propensity towards autoimmunity.  We did discuss getting labs to rule out these etiologies, but she prefers to hold off until her follow-up appointment.  In the interim, we will aggressively treat her rhinorrhea and postnasal drip with Atrovent nasal spray and RyVent in lieu of her Zyrtec.  She has a component of what sounds like gustatory rhinitis, which is treated with Atrovent.   Plan/Recommendations:   1. Hoarseness - Stop taking: Zyrtec (cetirizine) - Continue with: Flonase (fluticasone) twice weekly - Start taking: ipratropium nasal spray one spray per nostril at mealtimes (up to four times daily) and Ryvent (carbinoxamine) 6mg  tablet 3-4 times daily as needed - We will get labs to look for environmental allergies. - With your history of dry eyes and dry mouth, we will get labs to rule out Sjogren's syndrome as well.  - We will call you in 1-2 weeks with the results of the testing.  2. Return in about 4 weeks  (around 12/06/2017).  Subjective:   [name] [name] is a 62 y.o. female presenting today for evaluation of  Chief Complaint  Patient presents with  . Allergy Testing  . Cough  . Sinusitis    [name] [name] has a history of the following: Patient Active Problem List   Diagnosis Date Noted  . Cysts of both ovaries 05/26/2017  . Increased endometrial stripe thickness 05/26/2017  . Post-menopausal bleeding 05/26/2017  . Unspecified menopausal and perimenopausal disorder 05/26/2017  . Overactive bladder 05/26/2017    History obtained from: chart review and patient.  Emmaline Life was referred by Lavera Guise, MD.     Gastroenterologist: Dr. Wilford Corner [facility] GI) Otolaryngologist: Dr. Melissa Montane Dermatologist: Dr. Rolm Bookbinder [facility])   [name] [name] is a 62 y.o. female presenting for a 30-month history of hoarseness and throat clearing. She reports that the hoarseness is worse in the morning and after extended periods of talking. She states she is constantly clearing her throat, but does not have cough or sputum production. Her mouth feels dry all the time for which she drinks a lot of water. Her eyes also feel dry in the morning. Other associated symptoms include sneezing, pressure-type headaches along her forehead, and runny nose (particularly when she is cooking and eating, even if the food is not spicy).   She has had allergy testing in the past - once 30 years ago and most recently 8 years ago. She reports the tests were positive  for dust, mold, and pollen. After the first round of testing, she underwent 1 year of allergy shots, but then discontinued due to a move. She now takes Zyrtec everyday and Flonase twice per week. She has seen both ENT and GI for this problem. Recent endoscopy in March 2019 was normal. Recent laryngoscopy in July 2019 showed vocal cord thickening. She is currently taking Dexilant for reflux.  Otherwise, there is no history of other atopic  diseases, including asthma, drug allergies, food allergies, environmental allergies, stinging insect allergies, or urticaria. There is no significant infectious history.  Past Medical History: Patient Active Problem List   Diagnosis Date Noted  . Cysts of both ovaries 05/26/2017  . Increased endometrial stripe thickness 05/26/2017  . Post-menopausal bleeding 05/26/2017  . Unspecified menopausal and perimenopausal disorder 05/26/2017  . Overactive bladder 05/26/2017    Medication List:  Allergies as of 11/08/2017      Reactions   Ciprofloxacin Nausea Only   Dicyclomine Nausea Only   Oxybutynin Nausea Only   Penicillins    Phenazopyridine Nausea Only   Zithromax [azithromycin] Nausea Only      Medication List        Accurate as of 11/08/17  9:38 AM. Always use your most recent med list.          aspirin EC 81 MG tablet Take 81 mg by mouth daily.   CENTRUM SILVER 50+WOMEN PO Take by mouth.   cetirizine 10 MG tablet Commonly known as:  ZYRTEC Take 1 tablet (10 mg total) by mouth daily.   citalopram 10 MG tablet Commonly known as:  CELEXA Take 1 tablet (10 mg total) by mouth daily.   Clobetasol Propionate 0.05 % shampoo Use as directed twice weekly prn   conjugated estrogens vaginal cream Commonly known as:  PREMARIN Use 1 applicatorful vaginally 2 times weekly   CORDRAN 0.05 % lotion Generic drug:  flurandrenolide Apply topically 2 (two) times a week. Use as directed   DEXILANT 60 MG capsule Generic drug:  dexlansoprazole Take 60 mg by mouth daily.   estrogen (conjugated)-medroxyprogesterone 0.625-2.5 MG tablet Commonly known as:  PREMPRO Take 1 tablet by mouth daily.   fesoterodine 8 MG Tb24 tablet Commonly known as:  TOVIAZ Take 1 tablet (8 mg total) by mouth daily.   Flaxseed Oil 1000 MG Caps Take by mouth.   Magnesium 200 MG Tabs Take 1 tablet (200 mg total) by mouth 2 (two) times daily.   mirabegron ER 50 MG Tb24 tablet Commonly known as:   MYRBETRIQ Take 1 tablet (50 mg total) by mouth daily.   modafinil 200 MG tablet Commonly known as:  PROVIGIL Take 1 tablet (200 mg total) by mouth daily.   NASONEX 50 MCG/ACT nasal spray Generic drug:  mometasone Place into the nose.   NEXIUM 40 MG capsule Generic drug:  esomeprazole Take by mouth.   Potassium 99 MG Tabs Take 1 tablet (99 mg total) by mouth 2 (two) times daily.   Vitamin D 2000 units Caps Take 1 capsule (2,000 Units total) by mouth 2 (two) times daily.       Birth History: non-contributory.  Developmental History: non-contributory.   Past Surgical History: Past Surgical History:  Procedure Laterality Date  . BREAST BIOPSY Right 2016   cor bx   benign  . ruptured disc  1992     Family History: Family History  Problem Relation Age of Onset  . Asthma Mother   . Diabetes Mother   . Hypertension Mother   .  Stroke Mother   . Epilepsy Mother   . Coronary artery disease Mother   . Breast cancer Neg Hx      Social History: Shagun lives at home with her husband. She works for [employer] in [place] [employer]. she lives in a condominium that is [address] years old.  There are hardwood floors throughout the home.  She has gas heating and central cooling.  There are no animals inside or outside of the home.  She does have dust mite coverings on the bedding.  There is no tobacco exposure.    Review of Systems: a 14-point review of systems is pertinent for what is mentioned in HPI.  Otherwise, all other systems were negative. Constitutional: negative other than that listed in the HPI Eyes: negative other than that listed in the HPI Ears, nose, mouth, throat, and face: negative other than that listed in the HPI Respiratory: negative other than that listed in the HPI Cardiovascular: negative other than that listed in the HPI Gastrointestinal: negative other than that listed in the HPI Genitourinary: negative other than that listed in the HPI Integument: negative other than  that listed in the HPI Hematologic: negative other than that listed in the HPI Musculoskeletal: negative other than that listed in the HPI Neurological: negative other than that listed in the HPI Allergy/Immunologic: negative other than that listed in the HPI    Objective:   Blood pressure (!) 142/88, pulse 91, temperature 98.2 F (36.8 C), temperature source Oral, resp. rate 16, height 5' 3.75" (1.619 m), weight 149 lb 6.4 oz (67.8 kg), last menstrual period 08/09/2014, SpO2 98 %. Body mass index is 25.85 kg/m.   Physical Exam:  General: Alert, interactive, in no acute distress. Eyes: PEERL, No conjunctival injection bilaterally, no discharge on the right and no discharge on the left. PERRL bilaterally. EOMI without pain. No photophobia.  Ears: Normal external ears, Right TM pearly gray with normal light reflex and Left TM pearly gray with normal light reflex.  Nose/Throat: External nose within normal limits, nasal crease present and septum midline. Turbinates minimally edematous without discharge. Posterior oropharynx mildly erythematous without cobblestoning in the posterior oropharynx. Tonsils 2+ without exudates.  Tongue without thrush and Geographic tongue present. Neck: Supple without thyromegaly. Trachea midline. Lungs: Clear to auscultation without wheezing, rhonchi or rales. No increased work of breathing. CV: Normal S1/S2. No murmurs. Capillary refill <2 seconds.  Skin: Warm and dry, without lesions or rashes. Extremities:  No clubbing, cyanosis or edema. Neuro:   Grossly intact. No focal deficits appreciated. Responsive to questions.  Diagnostic studies: deferred due to recent antihistamine use      Salvatore Marvel, MD [facility] [place] [facility] [place]

## 2017-11-08 NOTE — Patient Instructions (Addendum)
1. Hoarseness - Stop taking: Zyrtec (cetirizine) - Continue with: Flonase (fluticasone) twice weekly - Start taking: ipratropium nasal spray one spray per nostril at mealtimes (up to four times daily) and Ryvent (carbinoxamine) 6mg  tablet 3-4 times daily as needed - We will get labs to look for environmental allergies. - With your history of dry eyes and dry mouth, we will get labs to rule out Sjogren's syndrome as well.  - We will call you in 1-2 weeks with the results of the testing.  2. Return in about 4 weeks (around 12/06/2017).  Please inform us of any Emergency Department visits, hospitalizations, or changes in symptoms. Call us before going to the ED for breathing or allergy symptoms since we might be able to fit you in for a sick visit. Feel free to contact us anytime with any questions, problems, or concerns.  It was a pleasure to meet you today!  Websites that have reliable patient information: 1. American Academy of Asthma, Allergy, and Immunology: www.aaaai.org 2. Food Allergy Research and Education (FARE): foodallergy.org 3. Mothers of Asthmatics: http://www.asthmacommunitynetwork.org 4. American College of Allergy, Asthma, and Immunology: MonthlyElectricBill.co.uk   Make sure you are registered to vote! If you have moved or changed any of your contact information, you will need to get this updated before voting!

## 2017-11-22 DIAGNOSIS — N838 Other noninflammatory disorders of ovary, fallopian tube and broad ligament: Secondary | ICD-10-CM | POA: Diagnosis not present

## 2017-11-22 DIAGNOSIS — N950 Postmenopausal bleeding: Secondary | ICD-10-CM | POA: Diagnosis not present

## 2017-11-22 DIAGNOSIS — N83202 Unspecified ovarian cyst, left side: Secondary | ICD-10-CM | POA: Diagnosis not present

## 2017-12-09 ENCOUNTER — Encounter: Payer: Self-pay | Admitting: Allergy & Immunology

## 2017-12-09 ENCOUNTER — Ambulatory Visit: Payer: 59 | Admitting: Allergy & Immunology

## 2017-12-09 VITALS — BP 126/82 | HR 96 | Resp 16

## 2017-12-09 DIAGNOSIS — R682 Dry mouth, unspecified: Secondary | ICD-10-CM

## 2017-12-09 DIAGNOSIS — K117 Disturbances of salivary secretion: Secondary | ICD-10-CM

## 2017-12-09 DIAGNOSIS — E507 Other ocular manifestations of vitamin A deficiency: Secondary | ICD-10-CM

## 2017-12-09 DIAGNOSIS — R490 Dysphonia: Secondary | ICD-10-CM

## 2017-12-09 MED ORDER — FLUTICASONE PROPIONATE 93 MCG/ACT NA EXHU: 2.0000 | INHALANT_SUSPENSION | Freq: Two times a day (BID) | NASAL | 5 refills | Status: DC

## 2017-12-09 NOTE — Patient Instructions (Addendum)
1. Hoarseness - Stop the Flonase and use Xhance instead (distributes more evenly and deeper in the nose).  - Continue with: Ryvent (carbinoxamine) 6mg  tablet but decrease to twice daily. - We will get labs to look for environmental allergies. - With your history of dry eyes and dry mouth, we will get labs to rule out Sjogren's syndrome as well.  - We will call you in 1-2 weeks with the results of the testing. - Call us with an update in 3-4 weeks.  - We may consider referral to another ENT for a second opinion.   2. Return in about 3 months (around 03/10/2018).  Please inform us of any Emergency Department visits, hospitalizations, or changes in symptoms. Call us before going to the ED for breathing or allergy symptoms since we might be able to fit you in for a sick visit. Feel free to contact us anytime with any questions, problems, or concerns.  It was a pleasure to see you again today!  Websites that have reliable patient information: 1. American Academy of Asthma, Allergy, and Immunology: www.aaaai.org 2. Food Allergy Research and Education (FARE): foodallergy.org 3. Mothers of Asthmatics: http://www.asthmacommunitynetwork.org 4. American College of Allergy, Asthma, and Immunology: MonthlyElectricBill.co.uk   Make sure you are registered to vote! If you have moved or changed any of your contact information, you will need to get this updated before voting!

## 2017-12-09 NOTE — Progress Notes (Signed)
FOLLOW UP  Date of Service/Encounter:  12/09/17   Assessment:   Hoarseness  Xerostomia  Xerophthalmia   Ms. [name] [name] presents with continued symptoms.  She has been using the RyVent consistently, but has not been using the Atrovent at all.  The RyVent is causing a lot of dryness, so I do not think the Atrovent in addition will help much.  The right and has helped with the postnasal drip, but she seems to swelling to the other and with increased dryness.  Today, she is reporting continued sinus pressure.  It is worse in the bilateral frontal sinuses.  She also has marketed throat and mouth dryness. Unfortunately, for whatever reason, she did not get the labs drawn from the last visit. So we will obtain the blood work and call her with the results.  We are also going to decrease her RyVent dosing and start Xhance in lieu Flonase to see if that can help with the frontal sinus pressure.   Plan/Recommendations:   1. Hoarseness - Stop the Flonase and use Xhance instead (distributes more evenly and deeper in the nose).  - Continue with: Ryvent (carbinoxamine) 6mg  tablet but decrease to twice daily. - We will get labs to look for environmental allergies. - With your history of dry eyes and dry mouth, we will get labs to rule out Sjogren's syndrome as well.  - We will call you in 1-2 weeks with the results of the testing. - Call us with an update in 3-4 weeks.  - We may consider referral to another ENT for a second opinion.   2. Return in about 3 months (around 03/10/2018).  Subjective:   [name] [name] is a 62 y.o. female presenting today for follow up of  Chief Complaint  Patient presents with  . Follow-up    Hoarse Throat  . Sinusitis    [name] [name] has a history of the following: Patient Active Problem List   Diagnosis Date Noted  . Cysts of both ovaries 05/26/2017  . Increased endometrial stripe thickness 05/26/2017  . Post-menopausal bleeding 05/26/2017  .  Unspecified menopausal and perimenopausal disorder 05/26/2017  . Overactive bladder 05/26/2017    History obtained from: chart review and patient.  [name] [name]'s Primary Care Provider is Lavera Guise, MD.     Gastroenterologist: Dr. Wilford Corner [facility] GI) Otolaryngologist: Dr. Melissa Montane Dermatologist: Dr. Rolm Bookbinder [facility]at[facility])  [name] [name] is a 62 y.o. female presenting for a follow up visit. She was last seen one month ago for a history of hoarseness. We recommended stopping cetirizine and continuing with fluticasone twice weekly. She was started on Atrovent prior to mealtimes and Ryvent 6mg  3-4 times daily as needed. We did get testing to rule out Sjogren's syndrome, which was negative.   Since the last visit, she has not done well. She continues to have hoarseness despite her medications. In fact, she reports that it is worse. The Ryvent has helped with the runny nose, but there is no improvement with the dry mouth and such. She remains on the Sustain eye drops without improvement.   She does report some problems with head pressure. She is also having problems with dysgeusia and loss of appetite. She is rather frustrated with her lack of improvement. She is drinking a lot of water to maintain the moisturize in her mouth. She has tried Biotene as well as other mouth rinses to see if these help, but there is no improvement.   Otherwise, there  have been no changes to her past medical history, surgical history, family history, or social history.    Review of Systems: a 14-point review of systems is pertinent for what is mentioned in HPI.  Otherwise, all other systems were negative. Constitutional: negative other than that listed in the HPI Eyes: negative other than that listed in the HPI Ears, nose, mouth, throat, and face: negative other than that listed in the HPI Respiratory: negative other than that listed in the HPI Cardiovascular: negative other than that listed in  the HPI Gastrointestinal: negative other than that listed in the HPI Genitourinary: negative other than that listed in the HPI Integument: negative other than that listed in the HPI Hematologic: negative other than that listed in the HPI Musculoskeletal: negative other than that listed in the HPI Neurological: negative other than that listed in the HPI Allergy/Immunologic: negative other than that listed in the HPI    Objective:   Blood pressure 126/82, pulse 96, resp. rate 16, last menstrual period 08/09/2014, SpO2 98 %. There is no height or weight on file to calculate BMI.   Physical Exam:  General: Alert, interactive, in no acute distress. Eyes: No conjunctival injection bilaterally, no discharge on the right, no discharge on the left and no Horner-Trantas dots present. PERRL bilaterally. EOMI without pain. No photophobia.  Ears: Right TM pearly gray with normal light reflex, Left TM pearly gray with normal light reflex, Right TM intact without perforation and Left TM intact without perforation.  Nose/Throat: External nose within normal limits and septum midline. Turbinates edematous with clear discharge. Posterior oropharynx mildly erythematous without cobblestoning in the posterior oropharynx. Tonsils 2+ without exudates.  Tongue without thrush. Lungs: Clear to auscultation without wheezing, rhonchi or rales. No increased work of breathing. CV: Normal S1/S2. No murmurs. Capillary refill <2 seconds.  Skin: Warm and dry, without lesions or rashes. Neuro:   Grossly intact. No focal deficits appreciated. Responsive to questions.  Diagnostic studies: none     Salvatore Marvel, MD  [facility] [place] [facility] [place]

## 2017-12-10 LAB — ANA: Anti Nuclear Antibody(ANA): NEGATIVE

## 2018-01-27 DIAGNOSIS — H26491 Other secondary cataract, right eye: Secondary | ICD-10-CM | POA: Diagnosis not present

## 2018-02-02 ENCOUNTER — Telehealth: Payer: Self-pay | Admitting: Allergy

## 2018-02-02 NOTE — Telephone Encounter (Signed)
Left message for patient to call [place] regarding the Xhance at [phone] or they could call me back.

## 2018-02-10 ENCOUNTER — Ambulatory Visit: Payer: 59 | Admitting: Nurse Practitioner

## 2018-02-28 ENCOUNTER — Other Ambulatory Visit: Payer: Self-pay

## 2018-02-28 DIAGNOSIS — N3281 Overactive bladder: Secondary | ICD-10-CM

## 2018-02-28 MED ORDER — FESOTERODINE FUMARATE ER 8 MG PO TB24: 8.0000 mg | ORAL_TABLET | Freq: Every day | ORAL | 0 refills | Status: DC

## 2018-03-04 ENCOUNTER — Telehealth: Payer: Self-pay

## 2018-03-04 ENCOUNTER — Ambulatory Visit: Payer: 59 | Admitting: Adult Health

## 2018-03-04 ENCOUNTER — Encounter: Payer: Self-pay | Admitting: Adult Health

## 2018-03-04 VITALS — BP 124/96 | HR 80 | Resp 16 | Ht 65.0 in | Wt 149.0 lb

## 2018-03-04 DIAGNOSIS — K219 Gastro-esophageal reflux disease without esophagitis: Secondary | ICD-10-CM

## 2018-03-04 DIAGNOSIS — G47419 Narcolepsy without cataplexy: Secondary | ICD-10-CM

## 2018-03-04 MED ORDER — MODAFINIL 200 MG PO TABS: 200.0000 mg | ORAL_TABLET | Freq: Every day | ORAL | 1 refills | Status: DC

## 2018-03-04 NOTE — Telephone Encounter (Signed)
Called [facility] [phone] and spoke with phar and cancelled modafinil pres because pt had hand written given to her in office today

## 2018-03-04 NOTE — Patient Instructions (Signed)
Narcolepsy Narcolepsy is a neurological disorder that causes you to fall asleep suddenly, and without control, during the daytime (sleep attacks). Narcolepsy is a lifelong (chronic) disorder. Normally, sleep follows a regular cycle over the course of the night. After about 90 minutes of light sleep, your sleep should become deeper. When your sleep becomes deeper, your body moves less and you start dreaming. This type of deep sleep is called rapid eye movement (REM) sleep. When you have narcolepsy, your REM sleep is not well-regulated. This disrupts your sleep cycle, which causes daytime sleepiness. What are the causes? The cause of narcolepsy is not fully understood, but it may be related to:  Low levels of hypocretin, a chemical (neurotransmitter) in the brain that controls sleep and wake cycles. Hypocretin imbalance may be caused by: ? Abnormal genes that are passed from parent to child (inherited). ? The body's defense system (immune system) attacking hypocretin brain cells (autoimmune disease).  Infection, tumor, or injury in the area of the brain that controls sleep.  Exposure to poisons (toxins), such as heavy metals, pesticides, and secondhand smoke.  What are the signs or symptoms? Symptoms of this condition include:  Excessive daytime sleepiness. This is the most common symptom and is usually the first symptom you will notice. This may affect your performance at work or school.  Sleep attacks. This means falling asleep suddenly and without control. You may fall asleep in the middle of an activity, especially low-energy activities like reading or watching TV.  Feeling like you cannot think clearly.  Trouble focusing or remembering things.  Feeling depressed.  Sudden muscle weakness (cataplexy). When this occurs, your speech may become slurred, or your knees may buckle. Cataplexy is usually triggered by surprise, anger, fear, or laughter.  Loss of the ability to speak or move  (sleep paralysis). This may occur just as you start to fall asleep or wake up. You will be aware of the paralysis. It usually lasts for just a few seconds or minutes.  Seeing, hearing, tasting, smelling, or feeling things that are not real (hallucinations). Hallucinations may occur with sleep paralysis. They can happen when you are falling asleep, waking up, or dozing.  Trouble staying asleep at night (insomnia).  Restless sleep.  How is this diagnosed? This condition may be diagnosed based on:  A physical exam to rule out any other problems that may be causing your symptoms.You may be asked to write down your sleeping patterns for several weeks in a sleep diary. This will help your health care provider make a diagnosis.  Sleep studies that measure how well your REM sleep is regulated. These tests also measure your heart rate, breathing, movement, and brain waves. These tests include: ? An overnight sleep study (polysomnogram). ? A daytime sleep study that is done while you take several naps during the day (multiple sleep latency test, MSLT). This test measures how quickly you fall asleep and how quickly you enter REM sleep.  Removal of spinal fluid to measure hypocretin levels.  How is this treated? There is no cure for this condition, but treatment can help relieve symptoms. Treatment may include:  Lifestyle and sleeping strategies to help you cope with the condition, such as: ? Exercising regularly. ? Maintaining a regular sleep schedule. ? Avoiding caffeine and large meals before bed.  Medicines. These may include: ? Medicines that help keep you awake and alert (stimulants) to fight daytime sleepiness. ? Medicines that treat depression (antidepressants). These may be used to treat cataplexy. ?   Sodium oxybate. This is a strong medicine to help you relax (sedative) that you may take at night. It can help control daytime sleepiness and cataplexy.  Follow these instructions at  home: Sleeping habits  Get about 8 hours of sleep every night.  Go to sleep and get up at about the same time every day.  Keep your bedroom dark, quiet, and comfortable.  When you feel very tired, take short naps. Schedule naps so that you take them at about the same time every day.  Tell your employer or teachers that you have narcolepsy. You may be able to adjust your schedule to include time for naps.  Before bedtime: ? Avoid bright lights and screens. ? Relax. Try activities like reading or taking a warm bath. Activity  Get at least 20 minutes of exercise every day. This will help you sleep better at night and reduce daytime sleepiness.  Avoid exercising within 3 hours of bedtime.  If you are sleepy, do not drive or use heavy machinery.  If possible, take a nap before driving.  Do not swim or go out on the water without a life jacket. Eating and drinking  Do not drink alcohol or caffeinated beverages within 4-5 hours of bedtime.  Do not eat a lot of food before bedtime. Eat meals at about the same times every day. General instructions  Take over-the-counter and prescription medicines only as told by your health care provider.  If directed, keep a sleep diary.  Tell your employer or teachers that you have narcolepsy. You may be able to adjust your schedule to include time for naps.  Do not use any products that contain nicotine or tobacco, such as cigarettes and e-cigarettes. If you need help quitting, ask your health care provider.  Keep all follow-up visits as told by your health care provider. This is important. Contact a health care provider if:  Your symptoms are not getting better.  You have increasingly high blood pressure (hypertension).  You have changes in your heart rhythm.  You are having a hard time determining what is real and what is not (psychosis). Get help right away if:  You hurt yourself during a sleep attack or an attack of  cataplexy.  You have chest pain.  You have trouble breathing. This information is not intended to replace advice given to you by your health care provider. Make sure you discuss any questions you have with your health care provider. Document Released: 03/06/2002 Document Revised: 03/09/2016 Document Reviewed: 03/09/2016 Elsevier Interactive Patient Education  2018 [employer]  

## 2018-03-04 NOTE — Progress Notes (Signed)
[facility] [place], [place] [address]  Pulmonary Sleep Medicine   Office Visit Note  Patient Name: [name] [name] DOB: [birth year] MRN [id number]  Date of Service: 03/04/2018  Complaints/HPI: Pt is here for follow up on Narcolepsy and GERD.  Patient reports excellent results using modafinil 200 mg daily for her narcolepsy.  She reports that she was taking Dexilant for her GERD however the price became too much and she is now switched back to over-the-counter medications for her GERD.  Today in the office she is smiling and pleasant.  She denies any complaints at this time.  She is requesting refill on her modafinil.  ROS  General: (-) fever, (-) chills, (-) night sweats, (-) weakness Skin: (-) rashes, (-) itching,. Eyes: (-) visual changes, (-) redness, (-) itching. Nose and Sinuses: (-) nasal stuffiness or itchiness, (-) postnasal drip, (-) nosebleeds, (-) sinus trouble. Mouth and Throat: (-) sore throat, (-) hoarseness. Neck: (-) swollen glands, (-) enlarged thyroid, (-) neck pain. Respiratory: - cough, (-) bloody sputum, - shortness of breath, - wheezing. Cardiovascular: - ankle swelling, (-) chest pain. Lymphatic: (-) lymph node enlargement. Neurologic: (-) numbness, (-) tingling. Psychiatric: (-) anxiety, (-) depression   Current Medication: Outpatient Encounter Medications as of 03/04/2018  Medication Sig  . aspirin EC 81 MG tablet Take 81 mg by mouth daily.  . Carbinoxamine Maleate (RYVENT) 6 MG TABS Take 6 mg by mouth 4 (four) times daily as needed.  . cetirizine (ZYRTEC) 10 MG tablet Take 1 tablet (10 mg total) by mouth daily.  . Cholecalciferol (VITAMIN D) 2000 units CAPS Take 1 capsule (2,000 Units total) by mouth 2 (two) times daily.  . Clobetasol Propionate 0.05 % shampoo Use as directed twice weekly prn  . conjugated estrogens (PREMARIN) vaginal cream Use 1 applicatorful vaginally 2 times weekly  . esomeprazole (NEXIUM) 40 MG capsule Take  by mouth.  . estrogen, conjugated,-medroxyprogesterone (PREMPRO) 0.625-2.5 MG tablet Take 1 tablet by mouth daily.  . fesoterodine (TOVIAZ) 8 MG TB24 tablet Take 1 tablet (8 mg total) by mouth daily.  . Flaxseed, Linseed, (FLAXSEED OIL) 1000 MG CAPS Take by mouth.  . flurandrenolide (CORDRAN) 0.05 % lotion Apply topically 2 (two) times a week. Use as directed  . Magnesium 200 MG TABS Take 1 tablet (200 mg total) by mouth 2 (two) times daily.  . modafinil (PROVIGIL) 200 MG tablet Take 1 tablet (200 mg total) by mouth daily.  . mometasone (NASONEX) 50 MCG/ACT nasal spray Place into the nose.  . Multiple Vitamins-Minerals (CENTRUM SILVER 50+WOMEN PO) Take by mouth.  . Potassium 99 MG TABS Take 1 tablet (99 mg total) by mouth 2 (two) times daily.  . [DISCONTINUED] modafinil (PROVIGIL) 200 MG tablet Take 1 tablet (200 mg total) by mouth daily.  . [DISCONTINUED] modafinil (PROVIGIL) 200 MG tablet Take 1 tablet (200 mg total) by mouth daily.  . [DISCONTINUED] modafinil (PROVIGIL) 200 MG tablet Take 1 tablet (200 mg total) by mouth daily.  . [DISCONTINUED] dexlansoprazole (DEXILANT) 60 MG capsule Take 60 mg by mouth daily.  . [DISCONTINUED] Fluticasone Propionate (XHANCE) 93 MCG/ACT EXHU Place 2 sprays into the nose 2 (two) times daily. (Patient not taking: Reported on 03/04/2018)  . [DISCONTINUED] ipratropium (ATROVENT) 0.06 % nasal spray One spray per nostril at mealtimes (up to four times daily). (Patient not taking: Reported on 03/04/2018)   No facility-administered encounter medications on file as of 03/04/2018.     Surgical History: Past Surgical History:  Procedure  Laterality Date  . BREAST BIOPSY Right 2016   cor bx   benign  . ruptured disc  1992    Medical History: Past Medical History:  Diagnosis Date  . Allergic rhinitis   . Depression   . GERD (gastroesophageal reflux disease)   . Hypertension   . Postmenopausal disorder     Family History: Family History  Problem Relation  Age of Onset  . Asthma Mother   . Diabetes Mother   . Hypertension Mother   . Stroke Mother   . Epilepsy Mother   . Coronary artery disease Mother   . Breast cancer Neg Hx     Social History: Social History   Socioeconomic History  . Marital status: Single    Spouse name: Not on file  . Number of children: Not on file  . Years of education: Not on file  . Highest education level: Not on file  Occupational History  . Not on file  Social Needs  . Financial resource strain: Not on file  . Food insecurity:    Worry: Not on file    Inability: Not on file  . Transportation needs:    Medical: Not on file    Non-medical: Not on file  Tobacco Use  . Smoking status: Never Smoker  . Smokeless tobacco: Never Used  Substance and Sexual Activity  . Alcohol use: Yes    Frequency: Never    Comment: social  . Drug use: Not Currently    Types: "Crack" cocaine  . Sexual activity: Not on file  Lifestyle  . Physical activity:    Days per week: Not on file    Minutes per session: Not on file  . Stress: Not on file  Relationships  . Social connections:    Talks on phone: Not on file    Gets together: Not on file    Attends religious service: Not on file    Active member of club or organization: Not on file    Attends meetings of clubs or organizations: Not on file    Relationship status: Not on file  . Intimate partner violence:    Fear of current or ex partner: Not on file    Emotionally abused: Not on file    Physically abused: Not on file    Forced sexual activity: Not on file  Other Topics Concern  . Not on file  Social History Narrative  . Not on file    Vital Signs: Blood pressure (!) 124/96, pulse 80, resp. rate 16, height 5\' 5"  (1.651 m), weight 149 lb (67.6 kg), last menstrual period 08/09/2014, SpO2 96 %.  Examination: General Appearance: The patient is well-developed, well-nourished, and in no distress. Skin: Gross inspection of skin unremarkable. Head:  normocephalic, no gross deformities. Eyes: no gross deformities noted. ENT: ears appear grossly normal no exudates. Neck: Supple. No thyromegaly. No LAD. Respiratory: clear bilateraly. Cardiovascular: Normal S1 and S2 without murmur or rub. Extremities: No cyanosis. pulses are equal. Neurologic: Alert and oriented. No involuntary movements.  LABS: Recent Results (from the past 2160 hour(s))  ANA     Status: None   Collection Time: 12/09/17 12:03 PM  Result Value Ref Range   Anti Nuclear Antibody(ANA) Negative Negative    Radiology: Mm Digital Screening Bilateral  Result Date: 09/07/2017 CLINICAL DATA:  Screening. EXAM: DIGITAL SCREENING BILATERAL MAMMOGRAM WITH CAD COMPARISON:  Previous exam(s). ACR Breast Density Category b: There are scattered areas of fibroglandular density. FINDINGS: There are no findings  suspicious for malignancy. Images were processed with CAD. IMPRESSION: No mammographic evidence of malignancy. A result letter of this screening mammogram will be mailed directly to the patient. RECOMMENDATION: Screening mammogram in one year. (Code:SM-B-01Y) BI-RADS CATEGORY  1: Negative. Electronically Signed   By: Kristopher Oppenheim M.D.   On: 09/07/2017 09:32    No results found.  No results found.    Assessment and Plan: Patient Active Problem List   Diagnosis Date Noted  . Cysts of both ovaries 05/26/2017  . Increased endometrial stripe thickness 05/26/2017  . Post-menopausal bleeding 05/26/2017  . Unspecified menopausal and perimenopausal disorder 05/26/2017  . Overactive bladder 05/26/2017   1. Primary narcolepsy without cataplexy Patient provided 3 handwritten prescriptions for modafinil for the next 3 months.  Patient's co-pay has risen significantly and I have provided her with a good Rx card to put her co-pay down.  She will take the paper prescriptions to a separate pharmacy and see which one will help. - modafinil (PROVIGIL) 200 MG tablet; Take 1 tablet (200 mg  total) by mouth daily.  Dispense: 90 tablet; Refill: 1  2. Gastroesophageal reflux disease without esophagitis Stable, patient given samples of Dexilant.   General Counseling: I have discussed the findings of the evaluation and examination with [name] [name].  I have also discussed any further diagnostic evaluation thatmay be needed or ordered today. [name] [name] verbalizes understanding of the findings of todays visit. We also reviewed her medications today and discussed drug interactions and side effects including but not limited excessive drowsiness and altered mental states. We also discussed that there is always a risk not just to her but also people around her. she has been encouraged to call the office with any questions or concerns that should arise related to todays visit.    Time spent: 25 This patient was seen by Orson Gear AGNP-C in Collaboration with Dr. Devona Konig as a part of collaborative care agreement.   I have personally obtained a history, examined the patient, evaluated laboratory and imaging results, formulated the assessment and plan and placed orders.    Allyne Gee, MD [facility] Pulmonary and Critical Care Sleep medicine

## 2018-03-07 ENCOUNTER — Other Ambulatory Visit: Payer: Self-pay

## 2018-03-07 NOTE — Telephone Encounter (Signed)
lmom to call back 

## 2018-03-28 ENCOUNTER — Encounter: Payer: Self-pay | Admitting: Nurse Practitioner

## 2018-03-28 ENCOUNTER — Ambulatory Visit: Payer: 59 | Admitting: Nurse Practitioner

## 2018-03-28 VITALS — BP 130/86 | HR 80 | Resp 16 | Ht 65.0 in | Wt 150.2 lb

## 2018-03-28 DIAGNOSIS — R319 Hematuria, unspecified: Secondary | ICD-10-CM | POA: Diagnosis not present

## 2018-03-28 DIAGNOSIS — R300 Dysuria: Secondary | ICD-10-CM | POA: Diagnosis not present

## 2018-03-28 DIAGNOSIS — N390 Urinary tract infection, site not specified: Secondary | ICD-10-CM | POA: Diagnosis not present

## 2018-03-28 LAB — POCT URINALYSIS DIPSTICK
Bilirubin, UA: NEGATIVE
Glucose, UA: NEGATIVE
KETONES UA: NEGATIVE
Leukocytes, UA: NEGATIVE
Nitrite, UA: NEGATIVE
PH UA: 8.5 — AB (ref 5.0–8.0)
Protein, UA: POSITIVE — AB
Spec Grav, UA: <=1.005 — AB (ref 1.010–1.025)
UROBILINOGEN UA: 0.2 U/dL

## 2018-03-28 MED ORDER — SULFAMETHOXAZOLE-TRIMETHOPRIM 800-160 MG PO TABS: 1.0000 | ORAL_TABLET | Freq: Two times a day (BID) | ORAL | 0 refills | Status: DC

## 2018-03-28 NOTE — Progress Notes (Signed)
[facility] [place], [place] [address]  Internal MEDICINE  Office Visit Note  Patient Name: [name] [name]  [id number]  [id number]  Date of Service: 03/28/2018   Pt is here for a sick visit.   Chief Complaint  Patient presents with  . Urinary Tract Infection    possible UTI, some buring, abdominal pain, frequent urination, started about two days ago.     The patient complains of urinary frequency and urgency. Started about 2 days ago and has become worse. She has some pelvic discomfort. Denies abdominal pain, nausea, or vomiting.she denies fever or headache.       Current Medication:  Outpatient Encounter Medications as of 03/28/2018  Medication Sig  . aspirin EC 81 MG tablet Take 81 mg by mouth daily.  . Carbinoxamine Maleate (RYVENT) 6 MG TABS Take 6 mg by mouth 4 (four) times daily as needed.  . cetirizine (ZYRTEC) 10 MG tablet Take 1 tablet (10 mg total) by mouth daily.  . Cholecalciferol (VITAMIN D) 2000 units CAPS Take 1 capsule (2,000 Units total) by mouth 2 (two) times daily.  . Clobetasol Propionate 0.05 % shampoo Use as directed twice weekly prn  . conjugated estrogens (PREMARIN) vaginal cream Use 1 applicatorful vaginally 2 times weekly  . esomeprazole (NEXIUM) 40 MG capsule Take by mouth.  . estrogen, conjugated,-medroxyprogesterone (PREMPRO) 0.625-2.5 MG tablet Take 1 tablet by mouth daily.  . fesoterodine (TOVIAZ) 8 MG TB24 tablet Take 1 tablet (8 mg total) by mouth daily.  . Flaxseed, Linseed, (FLAXSEED OIL) 1000 MG CAPS Take by mouth.  . flurandrenolide (CORDRAN) 0.05 % lotion Apply topically 2 (two) times a week. Use as directed  . Magnesium 200 MG TABS Take 1 tablet (200 mg total) by mouth 2 (two) times daily.  . modafinil (PROVIGIL) 200 MG tablet Take 1 tablet (200 mg total) by mouth daily.  . mometasone (NASONEX) 50 MCG/ACT nasal spray Place into the nose.  . Multiple Vitamins-Minerals (CENTRUM SILVER 50+WOMEN PO) Take by  mouth.  . Potassium 99 MG TABS Take 1 tablet (99 mg total) by mouth 2 (two) times daily.  Marland Kitchen sulfamethoxazole-trimethoprim (BACTRIM DS,SEPTRA DS) 800-160 MG tablet Take 1 tablet by mouth 2 (two) times daily.   No facility-administered encounter medications on file as of 03/28/2018.       Medical History: Past Medical History:  Diagnosis Date  . Allergic rhinitis   . Depression   . GERD (gastroesophageal reflux disease)   . Hypertension   . Postmenopausal disorder      Today's Vitals   03/28/18 1033  BP: 130/86  Pulse: 80  Resp: 16  SpO2: 99%  Weight: 150 lb 3.2 oz (68.1 kg)  Height: 5\' 5"  (1.651 m)    Review of Systems  Constitutional: Negative for activity change, chills, fatigue and unexpected weight change.  HENT: Negative for congestion, postnasal drip, rhinorrhea, sneezing and sore throat.   Respiratory: Negative for cough, chest tightness and shortness of breath.   Cardiovascular: Negative for chest pain and palpitations.  Gastrointestinal: Negative for abdominal pain, constipation, diarrhea, nausea and vomiting.  Genitourinary: Positive for dysuria, frequency, hematuria and pelvic pain.  Musculoskeletal: Negative for arthralgias, back pain, joint swelling and neck pain.  Skin: Negative for rash.  Neurological: Negative for dizziness, tremors, numbness and headaches.  Hematological: Negative for adenopathy. Does not bruise/bleed easily.  Psychiatric/Behavioral: Negative for behavioral problems (Depression), sleep disturbance and suicidal ideas. The patient is not nervous/anxious.     Physical Exam Vitals  signs and nursing note reviewed.  Constitutional:      General: She is not in acute distress.    Appearance: Normal appearance. She is well-developed. She is not diaphoretic.  HENT:     Head: Normocephalic and atraumatic.     Mouth/Throat:     Pharynx: No oropharyngeal exudate.  Eyes:     Extraocular Movements: Extraocular movements intact.     Pupils:  Pupils are equal, round, and reactive to light.  Neck:     Musculoskeletal: Normal range of motion and neck supple.     Thyroid: No thyromegaly.     Vascular: No JVD.     Trachea: No tracheal deviation.  Cardiovascular:     Rate and Rhythm: Normal rate and regular rhythm.     Heart sounds: Normal heart sounds. No murmur. No friction rub. No gallop.   Pulmonary:     Effort: Pulmonary effort is normal. No respiratory distress.     Breath sounds: Normal breath sounds. No wheezing or rales.  Chest:     Chest wall: No tenderness.  Abdominal:     General: Abdomen is flat. Bowel sounds are normal.     Palpations: Abdomen is soft.     Tenderness: There is no abdominal tenderness.  Genitourinary:    Comments: Urine sample showing moderate blood . Musculoskeletal: Normal range of motion.  Lymphadenopathy:     Cervical: No cervical adenopathy.  Skin:    General: Skin is warm and dry.  Neurological:     Mental Status: She is alert and oriented to person, place, and time.     Cranial Nerves: No cranial nerve deficit.  Psychiatric:        Behavior: Behavior normal.        Thought Content: Thought content normal.        Judgment: Judgment normal.   Assessment/Plan: 1. Urinary tract infection with hematuria, site unspecified Start bactrim DS bid for 7 days. Send urine for culture and sensitivity and adjust antibiotics as indicated.  - sulfamethoxazole-trimethoprim (BACTRIM DS,SEPTRA DS) 800-160 MG tablet; Take 1 tablet by mouth 2 (two) times daily.  Dispense: 14 tablet; Refill: 0  2. Dysuria Treat with antibiotics for infectin. Recommend increased water intake and cranberry juice to help acidify urine.  - POCT Urinalysis Dipstick - CULTURE, URINE COMPREHENSIVE  General Counseling: Antinette verbalizes understanding of the findings of todays visit and agrees with plan of treatment. I have discussed any further diagnostic evaluation that may be needed or ordered today. We also reviewed her  medications today. she has been encouraged to call the office with any questions or concerns that should arise related to todays visit.    Counseling:  This patient was seen by Leretha Pol FNP Collaboration with Dr Lavera Guise as a part of collaborative care agreement  Orders Placed This Encounter  Procedures  . CULTURE, URINE COMPREHENSIVE  . POCT Urinalysis Dipstick    Meds ordered this encounter  Medications  . sulfamethoxazole-trimethoprim (BACTRIM DS,SEPTRA DS) 800-160 MG tablet    Sig: Take 1 tablet by mouth 2 (two) times daily.    Dispense:  14 tablet    Refill:  0    Order Specific Question:   Supervising Provider    Answer:   Lavera Guise [[id number]]    Time spent: 25 Minutes

## 2018-04-01 LAB — CULTURE, URINE COMPREHENSIVE

## 2018-04-21 ENCOUNTER — Encounter: Payer: 59 | Admitting: Nurse Practitioner

## 2018-06-06 ENCOUNTER — Ambulatory Visit: Payer: 59 | Admitting: Internal Medicine

## 2018-06-13 ENCOUNTER — Other Ambulatory Visit: Payer: Self-pay

## 2018-06-13 DIAGNOSIS — N3281 Overactive bladder: Secondary | ICD-10-CM

## 2018-06-13 MED ORDER — FESOTERODINE FUMARATE ER 8 MG PO TB24: 8.0000 mg | ORAL_TABLET | Freq: Every day | ORAL | 0 refills | Status: DC

## 2018-06-20 ENCOUNTER — Other Ambulatory Visit: Payer: Self-pay

## 2018-06-20 ENCOUNTER — Ambulatory Visit (INDEPENDENT_AMBULATORY_CARE_PROVIDER_SITE_OTHER): Payer: 59 | Admitting: Nurse Practitioner

## 2018-06-20 ENCOUNTER — Encounter: Payer: Self-pay | Admitting: Nurse Practitioner

## 2018-06-20 VITALS — BP 148/82 | HR 74 | Resp 16 | Ht 65.0 in | Wt 148.4 lb

## 2018-06-20 DIAGNOSIS — R079 Chest pain, unspecified: Secondary | ICD-10-CM

## 2018-06-20 DIAGNOSIS — R300 Dysuria: Secondary | ICD-10-CM | POA: Diagnosis not present

## 2018-06-20 DIAGNOSIS — K219 Gastro-esophageal reflux disease without esophagitis: Secondary | ICD-10-CM

## 2018-06-20 DIAGNOSIS — R252 Cramp and spasm: Secondary | ICD-10-CM | POA: Diagnosis not present

## 2018-06-20 DIAGNOSIS — Z0001 Encounter for general adult medical examination with abnormal findings: Secondary | ICD-10-CM | POA: Diagnosis not present

## 2018-06-20 DIAGNOSIS — Z124 Encounter for screening for malignant neoplasm of cervix: Secondary | ICD-10-CM | POA: Diagnosis not present

## 2018-06-20 DIAGNOSIS — Z13820 Encounter for screening for osteoporosis: Secondary | ICD-10-CM | POA: Insufficient documentation

## 2018-06-20 DIAGNOSIS — G47419 Narcolepsy without cataplexy: Secondary | ICD-10-CM | POA: Insufficient documentation

## 2018-06-20 DIAGNOSIS — N3281 Overactive bladder: Secondary | ICD-10-CM

## 2018-06-20 DIAGNOSIS — Z1239 Encounter for other screening for malignant neoplasm of breast: Secondary | ICD-10-CM

## 2018-06-20 MED ORDER — FESOTERODINE FUMARATE ER 8 MG PO TB24: 8.0000 mg | ORAL_TABLET | Freq: Every day | ORAL | 1 refills | Status: DC

## 2018-06-20 MED ORDER — MODAFINIL 200 MG PO TABS: 200.0000 mg | ORAL_TABLET | Freq: Every day | ORAL | 1 refills | Status: DC

## 2018-06-20 MED ORDER — PANTOPRAZOLE SODIUM 40 MG PO TBEC: 40.0000 mg | DELAYED_RELEASE_TABLET | Freq: Every day | ORAL | 1 refills | Status: DC

## 2018-06-20 NOTE — Progress Notes (Signed)
[facility] [place], [place] [address]  Internal MEDICINE  Office Visit Note  Patient Name: [name] [name]  [id number]  [id number]  Date of Service: 06/20/2018   Pt is here for routine health maintenance examination  Chief Complaint  Patient presents with  . Annual Exam    pt has some concerns, medicatiom refills, when ot lays on right side she feels pressure in the chest, not sure if its from reflux or something else, pressure over the past few months she has had pressure in the middle of her chest that takes her breath away,    . Pain    cramps in feet and legs often, pt has been staying hydrated, has alternated tea with water lately, even with staying dehydrated pt has been getting the cramps currently taking potassium and magnesium as well    . Hypertension  . Gastroesophageal Reflux  . Depression  . Edema    pt noticed hands has been swollen more so the right side  . Gynecologic Exam    pt mention screening for cvervical cancer      The patient has been having intermittent chest discomfort/indigestion type feelings. Happening once every few weeks. Takes some deep breaths to help it go away. Is made worse with spicy foods and acidic foods. Laying on her right side also makes this worse. She is also having some significant cramping in her legs. This is intermittent. Has a foam, topical medication which she can apply which does help reduce the pain in her legs and feet.     Current Medication: Outpatient Encounter Medications as of 06/20/2018  Medication Sig Note  . aspirin EC 81 MG tablet Take 81 mg by mouth daily.   . Carbinoxamine Maleate (RYVENT) 6 MG TABS Take 6 mg by mouth 4 (four) times daily as needed.   . cetirizine (ZYRTEC) 10 MG tablet Take 1 tablet (10 mg total) by mouth daily.   . Cholecalciferol (VITAMIN D) 2000 units CAPS Take 1 capsule (2,000 Units total) by mouth 2 (two) times daily.   . Clobetasol Propionate 0.05 % shampoo Use as  directed twice weekly prn   . conjugated estrogens (PREMARIN) vaginal cream Use 1 applicatorful vaginally 2 times weekly   . estrogen, conjugated,-medroxyprogesterone (PREMPRO) 0.625-2.5 MG tablet Take 1 tablet by mouth daily.   . fesoterodine (TOVIAZ) 8 MG TB24 tablet Take 1 tablet (8 mg total) by mouth daily.   . Flaxseed, Linseed, (FLAXSEED OIL) 1000 MG CAPS Take by mouth.   . flurandrenolide (CORDRAN) 0.05 % lotion Apply topically 2 (two) times a week. Use as directed   . Magnesium 200 MG TABS Take 1 tablet (200 mg total) by mouth 2 (two) times daily.   . modafinil (PROVIGIL) 200 MG tablet Take 1 tablet (200 mg total) by mouth daily.   . mometasone (NASONEX) 50 MCG/ACT nasal spray Place into the nose.   . Multiple Vitamins-Minerals (CENTRUM SILVER 50+WOMEN PO) Take by mouth.   . Potassium 99 MG TABS Take 1 tablet (99 mg total) by mouth 2 (two) times daily.   . [DISCONTINUED] esomeprazole (NEXIUM) 40 MG capsule Take by mouth. 06/20/2018: will try pantoprazole 40mg  daily.   . [DISCONTINUED] fesoterodine (TOVIAZ) 8 MG TB24 tablet Take 1 tablet (8 mg total) by mouth daily.   . [DISCONTINUED] modafinil (PROVIGIL) 200 MG tablet Take 1 tablet (200 mg total) by mouth daily.   . [DISCONTINUED] modafinil (PROVIGIL) 200 MG tablet Take 1 tablet (200 mg total) by  mouth daily.   . pantoprazole (PROTONIX) 40 MG tablet Take 1 tablet (40 mg total) by mouth daily.   . [DISCONTINUED] sulfamethoxazole-trimethoprim (BACTRIM DS,SEPTRA DS) 800-160 MG tablet Take 1 tablet by mouth 2 (two) times daily. (Patient not taking: Reported on 06/20/2018)    No facility-administered encounter medications on file as of 06/20/2018.     Surgical History: Past Surgical History:  Procedure Laterality Date  . BREAST BIOPSY Right 2016   cor bx   benign  . ruptured disc  1992    Medical History: Past Medical History:  Diagnosis Date  . Allergic rhinitis   . Depression   . GERD (gastroesophageal reflux disease)   .  Hypertension   . Postmenopausal disorder     Family History: Family History  Problem Relation Age of Onset  . Asthma Mother   . Diabetes Mother   . Hypertension Mother   . Stroke Mother   . Epilepsy Mother   . Coronary artery disease Mother   . Breast cancer Neg Hx       Review of Systems  Constitutional: Negative for activity change, chills, fatigue and unexpected weight change.  HENT: Negative for congestion, postnasal drip, rhinorrhea, sneezing and sore throat.   Respiratory: Negative for cough, chest tightness and shortness of breath.   Cardiovascular: Positive for chest pain. Negative for palpitations.       Intermittent chest pain. Last episode was 3 to 4 weeks ago.   Gastrointestinal: Negative for abdominal pain, constipation, diarrhea, nausea and vomiting.       Worsening indigestion.   Endocrine: Negative for cold intolerance, heat intolerance, polydipsia and polyuria.  Genitourinary: Negative for dysuria, frequency, hematuria and pelvic pain.  Musculoskeletal: Positive for myalgias. Negative for arthralgias, back pain, joint swelling and neck pain.  Skin: Negative for rash.  Neurological: Negative for dizziness, tremors, numbness and headaches.  Hematological: Negative for adenopathy. Does not bruise/bleed easily.  Psychiatric/Behavioral: Positive for decreased concentration. Negative for behavioral problems (Depression), sleep disturbance and suicidal ideas. The patient is not nervous/anxious.     Today's Vitals   06/20/18 0849  BP: (!) 148/82  Pulse: 74  Resp: 16  SpO2: 98%  Weight: 148 lb 6.4 oz (67.3 kg)  Height: 5\' 5"  (1.651 m)   Body mass index is 24.7 kg/m.  Physical Exam Vitals signs and nursing note reviewed.  Constitutional:      General: She is not in acute distress.    Appearance: Normal appearance. She is well-developed. She is not diaphoretic.  HENT:     Head: Normocephalic and atraumatic.     Right Ear: External ear normal.     Left Ear:  External ear normal.     Nose: Nose normal.     Mouth/Throat:     Mouth: Mucous membranes are moist.     Pharynx: Oropharynx is clear. No oropharyngeal exudate.  Eyes:     Extraocular Movements: Extraocular movements intact.     Pupils: Pupils are equal, round, and reactive to light.  Neck:     Musculoskeletal: Normal range of motion and neck supple.     Thyroid: No thyromegaly.     Vascular: No carotid bruit or JVD.     Trachea: No tracheal deviation.  Cardiovascular:     Rate and Rhythm: Normal rate and regular rhythm.     Pulses: Normal pulses.     Heart sounds: Normal heart sounds. No murmur. No friction rub. No gallop.      Comments: ECG done  in office today is within normal limits.  Pulmonary:     Effort: Pulmonary effort is normal. No respiratory distress.     Breath sounds: Normal breath sounds. No wheezing or rales.  Chest:     Chest wall: No tenderness.     Breasts:        Right: Normal. No swelling, bleeding, inverted nipple, mass, nipple discharge, skin change or tenderness.        Left: Normal. No swelling, bleeding, inverted nipple, mass, nipple discharge, skin change or tenderness.  Abdominal:     General: Abdomen is flat. Bowel sounds are normal.     Palpations: Abdomen is soft.  Genitourinary:    Exam position: Supine.     Labia:        Right: No rash, tenderness or lesion.        Left: No rash, tenderness or lesion.      Vagina: Normal. No vaginal discharge, erythema or tenderness.     Cervix: No cervical motion tenderness, discharge, friability or erythema.     Uterus: Normal.      Adnexa: Right adnexa normal and left adnexa normal.     Comments: No tenderness, masses, or organomeglay present during bimanual exam . Musculoskeletal: Normal range of motion.  Lymphadenopathy:     Cervical: No cervical adenopathy.  Skin:    General: Skin is warm and dry.  Neurological:     Mental Status: She is alert and oriented to person, place, and time.     Cranial  Nerves: No cranial nerve deficit.  Psychiatric:        Behavior: Behavior normal.        Thought Content: Thought content normal.        Judgment: Judgment normal.      LABS: Recent Results (from the past 2160 hour(s))  POCT Urinalysis Dipstick     Status: Abnormal   Collection Time: 03/28/18 10:40 AM  Result Value Ref Range   Color, UA     Clarity, UA     Glucose, UA Negative Negative   Bilirubin, UA negative    Ketones, UA negative    Spec Grav, UA <=1.005 (A) 1.010 - 1.025   Blood, UA moderate    pH, UA 8.5 (A) 5.0 - 8.0   Protein, UA Positive (A) Negative   Urobilinogen, UA 0.2 0.2 or 1.0 E.U./dL   Nitrite, UA negative    Leukocytes, UA Negative Negative   Appearance     Odor    CULTURE, URINE COMPREHENSIVE     Status: Abnormal   Collection Time: 03/28/18 10:41 AM  Result Value Ref Range   Urine Culture, Comprehensive Final report (A)    Organism ID, Bacteria Proteus mirabilis (A)     Comment: Greater than 100,000 colony forming units per mL Cefazolin <=4 ug/mL Cefazolin with an MIC <=16 predicts susceptibility to the oral agents cefaclor, cefdinir, cefpodoxime, cefprozil, cefuroxime, cephalexin, and loracarbef when used for therapy of uncomplicated urinary tract infections due to E. coli, Klebsiella pneumoniae, and Proteus mirabilis.    ANTIMICROBIAL SUSCEPTIBILITY Comment     Comment:       ** S = Susceptible; I = Intermediate; R = Resistant **                    P = Positive; N = Negative             MICS are expressed in micrograms per mL    Antibiotic  RSLT#1    RSLT#2    RSLT#3    RSLT#4 Amoxicillin/Clavulanic Acid    S Ampicillin                     S Cefepime                       S Ceftriaxone                    S Cefuroxime                     S Ciprofloxacin                  S Ertapenem                      S Gentamicin                     S Levofloxacin                   S Meropenem                      S Nitrofurantoin                  R Piperacillin/Tazobactam        S Tetracycline                   R Tobramycin                     S Trimethoprim/Sulfa             S     Assessment/Plan: 1. Encounter for general adult medical examination with abnormal findings Annual health maintenance exam with pap smear today.   2. Leg cramps Check labs for further evaluation.  3. Chest pain, unspecified type - EKG 12-Lead within normal limits today.   4. Gastroesophageal reflux disease without esophagitis Change nexium to pantoprazole daily. Recommend she take OTC pepcid twice daily if needed for acute GERD symptoms.  - pantoprazole (PROTONIX) 40 MG tablet; Take 1 tablet (40 mg total) by mouth daily.  Dispense: 90 tablet; Refill: 1  5. Overactive bladder Renew toviaz - fesoterodine (TOVIAZ) 8 MG TB24 tablet; Take 1 tablet (8 mg total) by mouth daily.  Dispense: 90 tablet; Refill: 1  6. Primary narcolepsy without cataplexy May continue provigil 200mg  daily as needed. New prescription for #90 tablets with refills sent to her pharmacy.  - modafinil (PROVIGIL) 200 MG tablet; Take 1 tablet (200 mg total) by mouth daily.  Dispense: 90 tablet; Refill: 1  7. Screening for cervical cancer - Pap IG and HPV (high risk) DNA detection  8. Screening for breast cancer - MM DIGITAL SCREENING BILATERAL; Future  9. Dysuria - UA/M w/rflx Culture, Routine  General Counseling: Sandy Salaam understanding of the findings of todays visit and agrees with plan of treatment. I have discussed any further diagnostic evaluation that may be needed or ordered today. We also reviewed her medications today. she has been encouraged to call the office with any questions or concerns that should arise related to todays visit.    Counseling:  Cardiac risk factor modification:  1. Control blood pressure. 2. Exercise as prescribed. 3. Follow low sodium, low fat diet. and low fat and low cholestrol diet. 4. Take ASA 81mg  once a day. 5.  Restricted calories diet to lose weight.  This patient was seen by Leretha Pol FNP Collaboration with Dr Lavera Guise as a part of collaborative care agreement  Orders Placed This Encounter  Procedures  . MM DIGITAL SCREENING BILATERAL  . UA/M w/rflx Culture, Routine  . EKG 12-Lead    Meds ordered this encounter  Medications  . fesoterodine (TOVIAZ) 8 MG TB24 tablet    Sig: Take 1 tablet (8 mg total) by mouth daily.    Dispense:  90 tablet    Refill:  1    Preferred per patient's insurance    Order Specific Question:   Supervising Provider    Answer:   Lavera Guise [[id number]]  . DISCONTD: modafinil (PROVIGIL) 200 MG tablet    Sig: Take 1 tablet (200 mg total) by mouth daily.    Dispense:  90 tablet    Refill:  1    Order Specific Question:   Supervising Provider    Answer:   Lavera Guise [[id number]]  . modafinil (PROVIGIL) 200 MG tablet    Sig: Take 1 tablet (200 mg total) by mouth daily.    Dispense:  90 tablet    Refill:  1    Order Specific Question:   Supervising Provider    Answer:   Lavera Guise [[id number]]  . pantoprazole (PROTONIX) 40 MG tablet    Sig: Take 1 tablet (40 mg total) by mouth daily.    Dispense:  90 tablet    Refill:  1    Order Specific Question:   Supervising Provider    Answer:   Lavera Guise [[id number]]    Time spent: [place], MD  Internal Medicine

## 2018-06-20 NOTE — Progress Notes (Signed)
Pt blood pressure is elevated, taken twice,  1st reading: 151/80 machine 2nd reading: manually  Informed provider.148/82

## 2018-06-21 ENCOUNTER — Other Ambulatory Visit: Payer: Self-pay | Admitting: Nurse Practitioner

## 2018-06-21 DIAGNOSIS — Z0001 Encounter for general adult medical examination with abnormal findings: Secondary | ICD-10-CM | POA: Diagnosis not present

## 2018-06-21 DIAGNOSIS — E039 Hypothyroidism, unspecified: Secondary | ICD-10-CM | POA: Diagnosis not present

## 2018-06-21 DIAGNOSIS — R252 Cramp and spasm: Secondary | ICD-10-CM | POA: Diagnosis not present

## 2018-06-21 LAB — UA/M W/RFLX CULTURE, ROUTINE
BILIRUBIN UA: NEGATIVE
GLUCOSE, UA: NEGATIVE
KETONES UA: NEGATIVE
Leukocytes, UA: NEGATIVE
NITRITE UA: NEGATIVE
PROTEIN UA: NEGATIVE
RBC UA: NEGATIVE
SPEC GRAV UA: 1.008 (ref 1.005–1.030)
Urobilinogen, Ur: 0.2 mg/dL (ref 0.2–1.0)
pH, UA: 8 — ABNORMAL HIGH (ref 5.0–7.5)

## 2018-06-21 LAB — MICROSCOPIC EXAMINATION
Casts: NONE SEEN /lpf
WBC UA: NONE SEEN /HPF (ref 0–5)

## 2018-06-22 LAB — CBC
HEMATOCRIT: 37.2 % (ref 34.0–46.6)
Hemoglobin: 12.6 g/dL (ref 11.1–15.9)
MCH: 31.4 pg (ref 26.6–33.0)
MCHC: 33.9 g/dL (ref 31.5–35.7)
MCV: 93 fL (ref 79–97)
PLATELETS: 266 10*3/uL (ref 150–450)
RBC: 4.01 x10E6/uL (ref 3.77–5.28)
RDW: 10.6 % — AB (ref 11.7–15.4)
WBC: 6.1 10*3/uL (ref 3.4–10.8)

## 2018-06-22 LAB — COMPREHENSIVE METABOLIC PANEL
ALBUMIN: 4.2 g/dL (ref 3.8–4.8)
ALT: 9 IU/L (ref 0–32)
AST: 14 IU/L (ref 0–40)
Albumin/Globulin Ratio: 1.8 (ref 1.2–2.2)
Alkaline Phosphatase: 56 IU/L (ref 39–117)
BUN / CREAT RATIO: 13 (ref 12–28)
BUN: 10 mg/dL (ref 8–27)
Bilirubin Total: 0.4 mg/dL (ref 0.0–1.2)
CALCIUM: 9.6 mg/dL (ref 8.7–10.3)
CO2: 25 mmol/L (ref 20–29)
CREATININE: 0.75 mg/dL (ref 0.57–1.00)
Chloride: 102 mmol/L (ref 96–106)
GFR calc Af Amer: 97 mL/min/{1.73_m2} (ref >59)
GFR, EST NON AFRICAN AMERICAN: 85 mL/min/{1.73_m2} (ref >59)
GLOBULIN, TOTAL: 2.4 g/dL (ref 1.5–4.5)
GLUCOSE: 95 mg/dL (ref 65–99)
Potassium: 4.6 mmol/L (ref 3.5–5.2)
Sodium: 140 mmol/L (ref 134–144)
TOTAL PROTEIN: 6.6 g/dL (ref 6.0–8.5)

## 2018-06-22 LAB — LIPID PANEL W/O CHOL/HDL RATIO
Cholesterol, Total: 197 mg/dL (ref 100–199)
HDL: 75 mg/dL (ref >39)
LDL Calculated: 103 mg/dL — ABNORMAL HIGH (ref 0–99)
Triglycerides: 97 mg/dL (ref 0–149)
VLDL CHOLESTEROL CAL: 19 mg/dL (ref 5–40)

## 2018-06-22 LAB — T3: T3, Total: 290 ng/dL — ABNORMAL HIGH (ref 71–180)

## 2018-06-22 LAB — VITAMIN D 25 HYDROXY (VIT D DEFICIENCY, FRACTURES): Vit D, 25-Hydroxy: 48.9 ng/mL (ref 30.0–100.0)

## 2018-06-22 LAB — TSH: TSH: 0.634 u[IU]/mL (ref 0.450–4.500)

## 2018-06-22 LAB — T4, FREE: FREE T4: 1.38 ng/dL (ref 0.82–1.77)

## 2018-06-24 LAB — PAP IG AND HPV HIGH-RISK: HPV, high-risk: NEGATIVE

## 2018-08-01 ENCOUNTER — Telehealth: Payer: Self-pay | Admitting: Internal Medicine

## 2018-08-01 NOTE — Telephone Encounter (Signed)
Prior Authorization for medication Modafinil 200MG  tablets   PA Case: [id number], Status: Approved, Coverage Starts on: 03/28/2018, Coverage Ends on: 03/30/2019. Questions? Contact [phone]. Has been approved

## 2018-08-08 ENCOUNTER — Telehealth: Payer: Self-pay | Admitting: Internal Medicine

## 2018-08-08 NOTE — Telephone Encounter (Signed)
Prior authorization for medication prempro has been approved effective 03/28/2018- 03/30/19 Referral number [id number] [employer] MEDICARE  ID [id number]

## 2018-09-12 DIAGNOSIS — R69 Illness, unspecified: Secondary | ICD-10-CM | POA: Diagnosis not present

## 2018-09-14 DIAGNOSIS — H26492 Other secondary cataract, left eye: Secondary | ICD-10-CM | POA: Diagnosis not present

## 2018-10-07 ENCOUNTER — Encounter: Payer: Self-pay | Admitting: Nurse Practitioner

## 2018-10-07 ENCOUNTER — Other Ambulatory Visit: Payer: Self-pay

## 2018-10-07 ENCOUNTER — Ambulatory Visit (INDEPENDENT_AMBULATORY_CARE_PROVIDER_SITE_OTHER): Payer: Medicare HMO | Admitting: Nurse Practitioner

## 2018-10-07 VITALS — BP 147/84 | HR 72 | Temp 99.1°F | Resp 16 | Ht 65.0 in | Wt 150.6 lb

## 2018-10-07 DIAGNOSIS — R413 Other amnesia: Secondary | ICD-10-CM | POA: Diagnosis not present

## 2018-10-07 DIAGNOSIS — F4321 Adjustment disorder with depressed mood: Secondary | ICD-10-CM | POA: Diagnosis not present

## 2018-10-07 DIAGNOSIS — R69 Illness, unspecified: Secondary | ICD-10-CM | POA: Diagnosis not present

## 2018-10-07 DIAGNOSIS — R300 Dysuria: Secondary | ICD-10-CM | POA: Diagnosis not present

## 2018-10-07 DIAGNOSIS — R5383 Other fatigue: Secondary | ICD-10-CM

## 2018-10-07 LAB — POCT URINALYSIS DIPSTICK
Bilirubin, UA: NEGATIVE
Blood, UA: NEGATIVE
Glucose, UA: NEGATIVE
Ketones, UA: NEGATIVE
Leukocytes, UA: NEGATIVE
Nitrite, UA: NEGATIVE
Protein, UA: NEGATIVE
Spec Grav, UA: <=1.005 — AB (ref 1.010–1.025)
Urobilinogen, UA: 0.2 E.U./dL
pH, UA: 7.5 (ref 5.0–8.0)

## 2018-10-07 NOTE — Progress Notes (Signed)
[facility] [place], [place] [address]  Internal MEDICINE  Office Visit Note  Patient Name: [name] [name]  [id number]  [id number]  Date of Service: 10/12/2018   Pt is here for a sick visit.  Chief Complaint  Patient presents with  . Memory Loss    Pt experiencing recent memory loss, she just lost her mom about a month ago and its been hard  . Pain    lower back pain been going on for a little while was waiting until her appointment      The patient is here for sick visit. She states that she is having trouble with her memory. She states that she is, sometimes, unable to finish a sentence, as she loses train of thought halfway through. She states that this became so severe in recent months she had to retire. She has had these issues for some time. In 2017, both MRI and CT scan of the brain were performed. Both were normal. She states that her mother recently passed away with complications from alzheimer's disease. Her mother's sister also has alzheimer's disease. The patient is also c/o lower back pain with bilateral hip pain. It is hurting her to sleep on her side. Constantly has to toss and turn because if she lays on one side or the other, the hip and lower back become very sore.        Current Medication:  Outpatient Encounter Medications as of 10/07/2018  Medication Sig  . aspirin EC 81 MG tablet Take 81 mg by mouth daily.  . Carbinoxamine Maleate (RYVENT) 6 MG TABS Take 6 mg by mouth 4 (four) times daily as needed.  . cetirizine (ZYRTEC) 10 MG tablet Take 1 tablet (10 mg total) by mouth daily.  . Cholecalciferol (VITAMIN D) 2000 units CAPS Take 1 capsule (2,000 Units total) by mouth 2 (two) times daily.  . Clobetasol Propionate 0.05 % shampoo Use as directed twice weekly prn  . conjugated estrogens (PREMARIN) vaginal cream Use 1 applicatorful vaginally 2 times weekly  . estrogen, conjugated,-medroxyprogesterone (PREMPRO) 0.625-2.5 MG tablet Take  1 tablet by mouth daily.  . fesoterodine (TOVIAZ) 8 MG TB24 tablet Take 1 tablet (8 mg total) by mouth daily.  . Flaxseed, Linseed, (FLAXSEED OIL) 1000 MG CAPS Take by mouth.  . flurandrenolide (CORDRAN) 0.05 % lotion Apply topically 2 (two) times a week. Use as directed  . modafinil (PROVIGIL) 200 MG tablet Take 1 tablet (200 mg total) by mouth daily.  . mometasone (NASONEX) 50 MCG/ACT nasal spray Place into the nose.  . Multiple Vitamins-Minerals (CENTRUM SILVER 50+WOMEN PO) Take by mouth.  . pantoprazole (PROTONIX) 40 MG tablet Take 1 tablet (40 mg total) by mouth daily.  . Magnesium 200 MG TABS Take 1 tablet (200 mg total) by mouth 2 (two) times daily. (Patient not taking: Reported on 10/07/2018)  . Potassium 99 MG TABS Take 1 tablet (99 mg total) by mouth 2 (two) times daily. (Patient not taking: Reported on 10/07/2018)   No facility-administered encounter medications on file as of 10/07/2018.       Medical History: Past Medical History:  Diagnosis Date  . Allergic rhinitis   . Depression   . GERD (gastroesophageal reflux disease)   . Hypertension   . Postmenopausal disorder      Today's Vitals   10/07/18 1402  BP: (!) 147/84  Pulse: 72  Resp: 16  Temp: 99.1 F (37.3 C)  SpO2: 99%  Weight: 150 lb 9.6 oz (  68.3 kg)  Height: 5\' 5"  (1.651 m)   Body mass index is 25.06 kg/m.  Review of Systems  Constitutional: Positive for fatigue. Negative for chills and unexpected weight change.  HENT: Negative for congestion, postnasal drip, rhinorrhea, sneezing and sore throat.   Respiratory: Negative for cough, chest tightness and shortness of breath.   Cardiovascular: Negative for chest pain and palpitations.  Gastrointestinal: Negative for abdominal pain, constipation, diarrhea, nausea and vomiting.  Endocrine: Negative for cold intolerance, heat intolerance, polydipsia and polyuria.  Musculoskeletal: Positive for arthralgias and myalgias. Negative for back pain, joint swelling and  neck pain.       Low back pain which is radiating into the hipw. Most severe at night.   Skin: Negative for rash.  Neurological: Positive for headaches. Negative for tremors and numbness.       Worsening issues with memory.   Hematological: Negative for adenopathy. Does not bruise/bleed easily.  Psychiatric/Behavioral: Negative for behavioral problems (Depression), sleep disturbance and suicidal ideas. The patient is not nervous/anxious.     Physical Exam Vitals signs and nursing note reviewed.  Constitutional:      General: She is not in acute distress.    Appearance: Normal appearance. She is well-developed. She is not diaphoretic.  HENT:     Head: Normocephalic and atraumatic.     Mouth/Throat:     Pharynx: No oropharyngeal exudate.  Eyes:     Pupils: Pupils are equal, round, and reactive to light.  Neck:     Musculoskeletal: Normal range of motion and neck supple.     Thyroid: No thyromegaly.     Vascular: No JVD.     Trachea: No tracheal deviation.  Cardiovascular:     Rate and Rhythm: Normal rate and regular rhythm.     Heart sounds: Normal heart sounds. No murmur. No friction rub. No gallop.   Pulmonary:     Effort: Pulmonary effort is normal. No respiratory distress.     Breath sounds: No wheezing or rales.  Chest:     Chest wall: No tenderness.  Abdominal:     General: Bowel sounds are normal.     Palpations: Abdomen is soft.  Musculoskeletal: Normal range of motion.  Lymphadenopathy:     Cervical: No cervical adenopathy.  Skin:    General: Skin is warm and dry.  Neurological:     General: No focal deficit present.     Mental Status: She is alert and oriented to person, place, and time. Mental status is at baseline.     Cranial Nerves: No cranial nerve deficit.  Psychiatric:        Behavior: Behavior normal.        Thought Content: Thought content normal.        Judgment: Judgment normal.   Assessment/Plan: 1. Other fatigue Likely from poor sleep quality  and grie reaction from death of her mother. Will get labs to check thyroid panel and b12 level. Discuss with patient when results are back .  2. Grief reaction Normal grief after the loss of her mother. Will monitor.   3. Memory changes Check labs. Offered referral to neurology due to family history of Alzheimer's disease. She declined at this time. Will discuss further at next visit.   4. Dysuria - POCT Urinalysis Dipstick urine sample normal.   General Counseling: Sandy Salaam understanding of the findings of todays visit and agrees with plan of treatment. I have discussed any further diagnostic evaluation that may be needed or ordered  today. We also reviewed her medications today. she has been encouraged to call the office with any questions or concerns that should arise related to todays visit.    Counseling:  This patient was seen by Leretha Pol FNP Collaboration with Dr Lavera Guise as a part of collaborative care agreement  Orders Placed This Encounter  Procedures  . POCT Urinalysis Dipstick     Time spent: 25 Minutes

## 2018-10-11 ENCOUNTER — Other Ambulatory Visit: Payer: Self-pay | Admitting: Nurse Practitioner

## 2018-10-11 DIAGNOSIS — E611 Iron deficiency: Secondary | ICD-10-CM | POA: Diagnosis not present

## 2018-10-11 DIAGNOSIS — D6481 Anemia due to antineoplastic chemotherapy: Secondary | ICD-10-CM | POA: Diagnosis not present

## 2018-10-11 DIAGNOSIS — R413 Other amnesia: Secondary | ICD-10-CM | POA: Diagnosis not present

## 2018-10-11 DIAGNOSIS — M064 Inflammatory polyarthropathy: Secondary | ICD-10-CM | POA: Diagnosis not present

## 2018-10-11 DIAGNOSIS — R5383 Other fatigue: Secondary | ICD-10-CM | POA: Diagnosis not present

## 2018-10-12 DIAGNOSIS — F4321 Adjustment disorder with depressed mood: Secondary | ICD-10-CM | POA: Insufficient documentation

## 2018-10-12 LAB — RHEUMATOID FACTOR: Rhuematoid fact SerPl-aCnc: <10 IU/mL (ref 0.0–13.9)

## 2018-10-12 LAB — IRON AND TIBC
Iron Saturation: 42 % (ref 15–55)
Iron: 139 ug/dL (ref 27–139)
Total Iron Binding Capacity: 330 ug/dL (ref 250–450)
UIBC: 191 ug/dL (ref 118–369)

## 2018-10-12 LAB — ANA W/REFLEX IF POSITIVE: Anti Nuclear Antibody (ANA): NEGATIVE

## 2018-10-12 LAB — MAGNESIUM: Magnesium: 2 mg/dL (ref 1.6–2.3)

## 2018-10-12 LAB — B12 AND FOLATE PANEL
Folate: 18.8 ng/mL (ref >3.0)
Vitamin B-12: 1002 pg/mL (ref 232–1245)

## 2018-10-12 LAB — URIC ACID: Uric Acid: 3.8 mg/dL (ref 2.5–7.1)

## 2018-10-12 LAB — PHOSPHORUS: Phosphorus: 3.6 mg/dL (ref 3.0–4.3)

## 2018-10-12 LAB — FERRITIN: Ferritin: 107 ng/mL (ref 15–150)

## 2018-10-17 DIAGNOSIS — R69 Illness, unspecified: Secondary | ICD-10-CM | POA: Diagnosis not present

## 2018-10-18 DIAGNOSIS — H524 Presbyopia: Secondary | ICD-10-CM | POA: Diagnosis not present

## 2018-10-24 ENCOUNTER — Ambulatory Visit
Admission: RE | Admit: 2018-10-24 | Discharge: 2018-10-24 | Disposition: A | Discharge status: Home / Self Care | Payer: Medicare HMO | Source: Ambulatory Visit | Attending: Nurse Practitioner | Admitting: Nurse Practitioner

## 2018-10-24 ENCOUNTER — Other Ambulatory Visit: Payer: Self-pay

## 2018-10-24 DIAGNOSIS — N6489 Other specified disorders of breast: Secondary | ICD-10-CM | POA: Insufficient documentation

## 2018-10-24 DIAGNOSIS — Z1231 Encounter for screening mammogram for malignant neoplasm of breast: Secondary | ICD-10-CM | POA: Insufficient documentation

## 2018-10-24 DIAGNOSIS — Z1239 Encounter for other screening for malignant neoplasm of breast: Secondary | ICD-10-CM

## 2018-10-25 ENCOUNTER — Other Ambulatory Visit: Payer: Self-pay

## 2018-10-25 ENCOUNTER — Other Ambulatory Visit: Payer: Self-pay | Admitting: Nurse Practitioner

## 2018-10-25 DIAGNOSIS — N6320 Unspecified lump in the left breast, unspecified quadrant: Secondary | ICD-10-CM

## 2018-10-25 DIAGNOSIS — N6489 Other specified disorders of breast: Secondary | ICD-10-CM

## 2018-10-25 DIAGNOSIS — R928 Other abnormal and inconclusive findings on diagnostic imaging of breast: Secondary | ICD-10-CM

## 2018-10-25 MED ORDER — MIRABEGRON ER 50 MG PO TB24: 50.0000 mg | ORAL_TABLET | Freq: Every day | ORAL | 2 refills | Status: DC

## 2018-10-25 NOTE — Telephone Encounter (Signed)
Pt called that she like mybetriq go back as per heather send pres and d/c Lisbeth Ply

## 2018-10-26 ENCOUNTER — Telehealth: Payer: Self-pay

## 2018-10-26 NOTE — Telephone Encounter (Signed)
We can go back to enablex, detrol, oxybutinin. Whichever she prefers.

## 2018-11-02 DIAGNOSIS — R69 Illness, unspecified: Secondary | ICD-10-CM | POA: Diagnosis not present

## 2018-11-08 ENCOUNTER — Other Ambulatory Visit: Payer: Self-pay

## 2018-11-08 ENCOUNTER — Ambulatory Visit (INDEPENDENT_AMBULATORY_CARE_PROVIDER_SITE_OTHER): Payer: Medicare HMO | Admitting: Adult Health

## 2018-11-08 ENCOUNTER — Encounter: Payer: Self-pay | Admitting: Adult Health

## 2018-11-08 VITALS — BP 124/82 | HR 88 | Temp 98.5°F | Resp 16 | Ht 65.0 in | Wt 150.0 lb

## 2018-11-08 DIAGNOSIS — R300 Dysuria: Secondary | ICD-10-CM

## 2018-11-08 DIAGNOSIS — R109 Unspecified abdominal pain: Secondary | ICD-10-CM | POA: Diagnosis not present

## 2018-11-08 DIAGNOSIS — N3001 Acute cystitis with hematuria: Secondary | ICD-10-CM

## 2018-11-08 LAB — POCT URINALYSIS DIPSTICK
Glucose, UA: NEGATIVE
Nitrite, UA: NEGATIVE
Protein, UA: POSITIVE — AB
Spec Grav, UA: 1.015 (ref 1.010–1.025)
Urobilinogen, UA: 2 E.U./dL — AB
pH, UA: 6.5 (ref 5.0–8.0)

## 2018-11-08 MED ORDER — TRAMADOL HCL 50 MG PO TABS: 50.0000 mg | ORAL_TABLET | Freq: Every day | ORAL | 0 refills | Status: AC

## 2018-11-08 NOTE — Progress Notes (Signed)
[facility] [place], [place] [address]  Internal MEDICINE  Office Visit Note  Patient Name: [name] [name]  [id number]  [id number]  Date of Service: 11/08/2018  Chief Complaint  Patient presents with  . Back Pain    left side pain      HPI Pt is here for a sick visit. She reports left sided back/flank pain for a few days.  Her urine dip shows small leukocytes, positive protein and large RBC.      Current Medication:  Outpatient Encounter Medications as of 11/08/2018  Medication Sig  . aspirin EC 81 MG tablet Take 81 mg by mouth daily.  . Carbinoxamine Maleate (RYVENT) 6 MG TABS Take 6 mg by mouth 4 (four) times daily as needed.  . cetirizine (ZYRTEC) 10 MG tablet Take 1 tablet (10 mg total) by mouth daily.  . Cholecalciferol (VITAMIN D) 2000 units CAPS Take 1 capsule (2,000 Units total) by mouth 2 (two) times daily.  . Clobetasol Propionate 0.05 % shampoo Use as directed twice weekly prn  . conjugated estrogens (PREMARIN) vaginal cream Use 1 applicatorful vaginally 2 times weekly  . estrogen, conjugated,-medroxyprogesterone (PREMPRO) 0.625-2.5 MG tablet Take 1 tablet by mouth daily.  . Flaxseed, Linseed, (FLAXSEED OIL) 1000 MG CAPS Take by mouth.  . flurandrenolide (CORDRAN) 0.05 % lotion Apply topically 2 (two) times a week. Use as directed  . mirabegron ER (MYRBETRIQ) 50 MG TB24 tablet Take 1 tablet (50 mg total) by mouth daily.  . modafinil (PROVIGIL) 200 MG tablet Take 1 tablet (200 mg total) by mouth daily.  . mometasone (NASONEX) 50 MCG/ACT nasal spray Place into the nose.  . Multiple Vitamins-Minerals (CENTRUM SILVER 50+WOMEN PO) Take by mouth.  . pantoprazole (PROTONIX) 40 MG tablet Take 1 tablet (40 mg total) by mouth daily.   No facility-administered encounter medications on file as of 11/08/2018.       Medical History: Past Medical History:  Diagnosis Date  . Allergic rhinitis   . Depression   . GERD (gastroesophageal reflux  disease)   . Hypertension   . Postmenopausal disorder      Vital Signs: BP 124/82   Pulse 88   Temp 98.5 F (36.9 C)   Resp 16   Ht 5\' 5"  (1.651 m)   Wt 150 lb (68 kg)   LMP 08/09/2014   SpO2 99%   BMI 24.96 kg/m    Review of Systems  Constitutional: Negative for chills, fatigue and unexpected weight change.  HENT: Negative for congestion, rhinorrhea, sneezing and sore throat.   Eyes: Negative for photophobia, pain and redness.  Respiratory: Negative for cough, chest tightness and shortness of breath.   Cardiovascular: Negative for chest pain and palpitations.  Gastrointestinal: Negative for abdominal pain, constipation, diarrhea, nausea and vomiting.  Endocrine: Negative.   Genitourinary: Negative for dysuria and frequency.  Musculoskeletal: Negative for arthralgias, back pain, joint swelling and neck pain.  Skin: Negative for rash.  Allergic/Immunologic: Negative.   Neurological: Negative for tremors and numbness.  Hematological: Negative for adenopathy. Does not bruise/bleed easily.  Psychiatric/Behavioral: Negative for behavioral problems and sleep disturbance. The patient is not nervous/anxious.     Physical Exam Vitals signs and nursing note reviewed.  Constitutional:      General: She is not in acute distress.    Appearance: She is well-developed. She is not diaphoretic.  HENT:     Head: Normocephalic and atraumatic.     Mouth/Throat:     Pharynx: No oropharyngeal  exudate.  Eyes:     Pupils: Pupils are equal, round, and reactive to light.  Neck:     Musculoskeletal: Normal range of motion and neck supple.     Thyroid: No thyromegaly.     Vascular: No JVD.     Trachea: No tracheal deviation.  Cardiovascular:     Rate and Rhythm: Normal rate and regular rhythm.     Heart sounds: Normal heart sounds. No murmur. No friction rub. No gallop.   Pulmonary:     Effort: Pulmonary effort is normal. No respiratory distress.     Breath sounds: Normal breath sounds.  No wheezing or rales.  Chest:     Chest wall: No tenderness.  Abdominal:     Palpations: Abdomen is soft.     Tenderness: There is no abdominal tenderness. There is no guarding.  Musculoskeletal: Normal range of motion.  Lymphadenopathy:     Cervical: No cervical adenopathy.  Skin:    General: Skin is warm and dry.  Neurological:     Mental Status: She is alert and oriented to person, place, and time.     Cranial Nerves: No cranial nerve deficit.  Psychiatric:        Behavior: Behavior normal.        Thought Content: Thought content normal.        Judgment: Judgment normal.    Assessment/Plan: 1. Acute cystitis with hematuria Send urine for culture, will send antibiotics with results are available per patient request.  - CULTURE, URINE COMPREHENSIVE  2. Dysuria See results.  - POCT Urinalysis Dipstick  3. Flank pain Reviewed risks and possible side effects associated with taking opiates, benzodiazepines and other CNS depressants. Combination of these could cause dizziness and drowsiness. Advised patient not to drive or operate machinery when taking these medications, as patient's and other's life can be at risk and will have consequences. Patient verbalized understanding in this matter. Dependence and abuse for these drugs will be monitored closely. A Controlled substance policy and procedure is on file which allows [facility] to order a urine drug screen test at any visit. Patient understands and agrees with the plan - traMADol (ULTRAM) 50 MG tablet; Take 1 tablet (50 mg total) by mouth at bedtime for 5 days.  Dispense: 5 tablet; Refill: 0  General Counseling: Barbra verbalizes understanding of the findings of todays visit and agrees with plan of treatment. I have discussed any further diagnostic evaluation that may be needed or ordered today. We also reviewed her medications today. she has been encouraged to call the office with any questions or concerns that should  arise related to todays visit.   Orders Placed This Encounter  Procedures  . CULTURE, URINE COMPREHENSIVE  . POCT Urinalysis Dipstick    No orders of the defined types were placed in this encounter.   Time spent: 15  Minutes  This patient was seen by Orson Gear AGNP-C in Collaboration with Dr Lavera Guise as a part of collaborative care agreement.  Kendell Bane AGNP-C Internal Medicine

## 2018-11-09 ENCOUNTER — Other Ambulatory Visit: Payer: Self-pay | Admitting: Internal Medicine

## 2018-11-09 DIAGNOSIS — N950 Postmenopausal bleeding: Secondary | ICD-10-CM

## 2018-11-09 DIAGNOSIS — M47896 Other spondylosis, lumbar region: Secondary | ICD-10-CM | POA: Diagnosis not present

## 2018-11-09 DIAGNOSIS — M545 Low back pain: Secondary | ICD-10-CM | POA: Diagnosis not present

## 2018-11-09 NOTE — Progress Notes (Signed)
Review urine

## 2018-11-10 ENCOUNTER — Ambulatory Visit
Admission: RE | Admit: 2018-11-10 | Discharge: 2018-11-10 | Disposition: A | Discharge status: Home / Self Care | Payer: Medicare HMO | Source: Ambulatory Visit | Attending: Nurse Practitioner | Admitting: Nurse Practitioner

## 2018-11-10 ENCOUNTER — Other Ambulatory Visit: Payer: Self-pay

## 2018-11-10 DIAGNOSIS — R928 Other abnormal and inconclusive findings on diagnostic imaging of breast: Secondary | ICD-10-CM

## 2018-11-10 DIAGNOSIS — N6489 Other specified disorders of breast: Secondary | ICD-10-CM

## 2018-11-10 DIAGNOSIS — N6320 Unspecified lump in the left breast, unspecified quadrant: Secondary | ICD-10-CM | POA: Diagnosis not present

## 2018-11-10 DIAGNOSIS — R922 Inconclusive mammogram: Secondary | ICD-10-CM | POA: Diagnosis not present

## 2018-11-10 DIAGNOSIS — N6012 Diffuse cystic mastopathy of left breast: Secondary | ICD-10-CM | POA: Diagnosis not present

## 2018-11-10 NOTE — Progress Notes (Signed)
Benign after additional images

## 2018-11-11 ENCOUNTER — Ambulatory Visit: Payer: Medicare HMO | Admitting: Nurse Practitioner

## 2018-11-12 ENCOUNTER — Other Ambulatory Visit: Payer: Self-pay | Admitting: Adult Health

## 2018-11-12 MED ORDER — NITROFURANTOIN MONOHYD MACRO 100 MG PO CAPS: 100.0000 mg | ORAL_CAPSULE | Freq: Two times a day (BID) | ORAL | 0 refills | Status: AC

## 2018-11-12 NOTE — Progress Notes (Signed)
Sent rx for macrobid to pts pharmacy.

## 2018-11-13 LAB — CULTURE, URINE COMPREHENSIVE

## 2018-11-15 ENCOUNTER — Telehealth: Payer: Self-pay

## 2018-11-15 NOTE — Telephone Encounter (Signed)
Pt was notified.  

## 2018-11-15 NOTE — Telephone Encounter (Signed)
The urine culture and sensitivity show infection with e. Coli. This is a bacteria which very commonly causes urinary tract infection. She was started on macrobid. The sensitivity shows that macrobid is appropriate and effective against this specific bacteria.

## 2018-11-23 ENCOUNTER — Other Ambulatory Visit: Payer: Self-pay

## 2018-11-23 DIAGNOSIS — K219 Gastro-esophageal reflux disease without esophagitis: Secondary | ICD-10-CM

## 2018-11-23 MED ORDER — PANTOPRAZOLE SODIUM 40 MG PO TBEC: 40.0000 mg | DELAYED_RELEASE_TABLET | Freq: Every day | ORAL | 1 refills | Status: DC

## 2018-11-24 ENCOUNTER — Ambulatory Visit (INDEPENDENT_AMBULATORY_CARE_PROVIDER_SITE_OTHER): Payer: Medicare HMO | Admitting: Nurse Practitioner

## 2018-11-24 ENCOUNTER — Encounter: Payer: Self-pay | Admitting: Nurse Practitioner

## 2018-11-24 VITALS — BP 144/72 | HR 77 | Resp 16 | Ht 65.0 in | Wt 152.4 lb

## 2018-11-24 DIAGNOSIS — R300 Dysuria: Secondary | ICD-10-CM | POA: Diagnosis not present

## 2018-11-24 DIAGNOSIS — R319 Hematuria, unspecified: Secondary | ICD-10-CM

## 2018-11-24 DIAGNOSIS — Z13820 Encounter for screening for osteoporosis: Secondary | ICD-10-CM

## 2018-11-24 DIAGNOSIS — M25552 Pain in left hip: Secondary | ICD-10-CM

## 2018-11-24 DIAGNOSIS — F4321 Adjustment disorder with depressed mood: Secondary | ICD-10-CM

## 2018-11-24 DIAGNOSIS — M25551 Pain in right hip: Secondary | ICD-10-CM | POA: Diagnosis not present

## 2018-11-24 DIAGNOSIS — R69 Illness, unspecified: Secondary | ICD-10-CM | POA: Diagnosis not present

## 2018-11-24 DIAGNOSIS — N390 Urinary tract infection, site not specified: Secondary | ICD-10-CM | POA: Diagnosis not present

## 2018-11-24 DIAGNOSIS — F4320 Adjustment disorder, unspecified: Secondary | ICD-10-CM

## 2018-11-24 LAB — POCT URINALYSIS DIPSTICK
Bilirubin, UA: NEGATIVE
Blood, UA: POSITIVE
Glucose, UA: NEGATIVE
Ketones, UA: NEGATIVE
Leukocytes, UA: NEGATIVE
Nitrite, UA: NEGATIVE
Protein, UA: POSITIVE — AB
Spec Grav, UA: 1.015 (ref 1.010–1.025)
Urobilinogen, UA: 0.2 E.U./dL
pH, UA: 6.5 (ref 5.0–8.0)

## 2018-11-24 MED ORDER — MELOXICAM 7.5 MG PO TABS: 7.5000 mg | ORAL_TABLET | Freq: Every day | ORAL | 2 refills | Status: DC

## 2018-11-24 MED ORDER — SULFAMETHOXAZOLE-TRIMETHOPRIM 800-160 MG PO TABS: 1.0000 | ORAL_TABLET | Freq: Two times a day (BID) | ORAL | 0 refills | Status: DC

## 2018-11-24 NOTE — Addendum Note (Signed)
Addended byWaynard Edwards on: 11/24/2018 04:23 PM   Modules accepted: Orders

## 2018-11-24 NOTE — Progress Notes (Signed)
[facility], [place] [address]  Internal MEDICINE  Office Visit Note  Patient Name: [name] [name]  [id number]  [id number]  Date of Service: 11/24/2018  Chief Complaint  Patient presents with  . Dysuria  . Bone Density    [facility] and they asked if she has ever gotten one    The patient is here for follow up visit. She was treated 11/08/2018 for urinary tract infection. Has completed treatment with macrobid. She states that urinary symptoms are getting better. Urine dip still showing some blood and protein in the urine today. She does have some right sided hip pain. Saw orthopedics for left hip pain earlier in the month. Was given a transdermal patch to apply to improve the symptoms. Per patient, x-rays showed nothing acute. Left hip pain recently resolved, but now on right side.       Current Medication: Outpatient Encounter Medications as of 11/24/2018  Medication Sig  . aspirin EC 81 MG tablet Take 81 mg by mouth daily.  . Calcium Carbonate-Vit D-Min (CALCIUM 1200 PO) Take by mouth daily.  . cetirizine (ZYRTEC) 10 MG tablet Take 1 tablet (10 mg total) by mouth daily.  . Cholecalciferol (VITAMIN D) 2000 units CAPS Take 1 capsule (2,000 Units total) by mouth 2 (two) times daily.  . Clobetasol Propionate 0.05 % shampoo Use as directed twice weekly prn  . conjugated estrogens (PREMARIN) vaginal cream Use 1 applicatorful vaginally 2 times weekly  . Flaxseed, Linseed, (FLAXSEED OIL) 1000 MG CAPS Take by mouth.  . flurandrenolide (CORDRAN) 0.05 % lotion Apply topically 2 (two) times a week. Use as directed  . Fluticasone Propionate (FLONASE NA) Place into the nose as needed.  . mirabegron ER (MYRBETRIQ) 50 MG TB24 tablet Take 1 tablet (50 mg total) by mouth daily.  . modafinil (PROVIGIL) 200 MG tablet Take 1 tablet (200 mg total) by mouth daily.  . Multiple Vitamins-Minerals (CENTRUM SILVER 50+WOMEN PO) Take by mouth.  . pantoprazole  (PROTONIX) 40 MG tablet Take 1 tablet (40 mg total) by mouth daily.  Marland Kitchen PREMPRO 0.625-2.5 MG tablet TAKE 1 TABLET BY MOUTH EVERY DAY  . meloxicam (MOBIC) 7.5 MG tablet Take 1 tablet (7.5 mg total) by mouth daily.  Marland Kitchen sulfamethoxazole-trimethoprim (BACTRIM DS) 800-160 MG tablet Take 1 tablet by mouth 2 (two) times daily.  . [DISCONTINUED] Carbinoxamine Maleate (RYVENT) 6 MG TABS Take 6 mg by mouth 4 (four) times daily as needed. (Patient not taking: Reported on 11/24/2018)  . [DISCONTINUED] mometasone (NASONEX) 50 MCG/ACT nasal spray Place into the nose.   No facility-administered encounter medications on file as of 11/24/2018.     Surgical History: Past Surgical History:  Procedure Laterality Date  . BREAST BIOPSY Right 2016   cor bx   BENIGN BREAST LOBULES AND FIBROADIPOSE TISSUE   . ruptured disc  1992    Medical History: Past Medical History:  Diagnosis Date  . Allergic rhinitis   . Depression   . GERD (gastroesophageal reflux disease)   . Hypertension   . Postmenopausal disorder     Family History: Family History  Problem Relation Age of Onset  . Asthma Mother   . Diabetes Mother   . Hypertension Mother   . Stroke Mother   . Epilepsy Mother   . Coronary artery disease Mother   . Breast cancer Neg Hx     Social History   Socioeconomic History  . Marital status: Married    Spouse name:  Not on file  . Number of children: Not on file  . Years of education: Not on file  . Highest education level: Not on file  Occupational History  . Not on file  Social Needs  . Financial resource strain: Not on file  . Food insecurity    Worry: Not on file    Inability: Not on file  . Transportation needs    Medical: Not on file    Non-medical: Not on file  Tobacco Use  . Smoking status: Never Smoker  . Smokeless tobacco: Never Used  Substance and Sexual Activity  . Alcohol use: Yes    Frequency: Never    Comment: holidays, very rarely  . Drug use: Not Currently  . Sexual  activity: Not on file  Lifestyle  . Physical activity    Days per week: Not on file    Minutes per session: Not on file  . Stress: Not on file  Relationships  . Social Herbalist on phone: Not on file    Gets together: Not on file    Attends religious service: Not on file    Active member of club or organization: Not on file    Attends meetings of clubs or organizations: Not on file    Relationship status: Not on file  . Intimate partner violence    Fear of current or ex partner: Not on file    Emotionally abused: Not on file    Physically abused: Not on file    Forced sexual activity: Not on file  Other Topics Concern  . Not on file  Social History Narrative  . Not on file      Review of Systems  Constitutional: Positive for fatigue. Negative for chills and unexpected weight change.  HENT: Negative for congestion, postnasal drip, rhinorrhea, sneezing and sore throat.   Respiratory: Negative for cough, chest tightness, shortness of breath and wheezing.   Cardiovascular: Negative for chest pain and palpitations.  Gastrointestinal: Negative for abdominal pain, constipation, diarrhea, nausea and vomiting.  Endocrine: Negative for cold intolerance, heat intolerance, polydipsia and polyuria.  Genitourinary: Positive for dysuria, flank pain, frequency and hematuria.  Musculoskeletal: Positive for arthralgias and myalgias. Negative for back pain, joint swelling and neck pain.       Low back pain which is radiating into the right hip.   Skin: Negative for rash.  Neurological: Positive for headaches. Negative for tremors and numbness.       Worsening issues with memory.   Hematological: Negative for adenopathy. Does not bruise/bleed easily.  Psychiatric/Behavioral: Negative for behavioral problems (Depression), sleep disturbance and suicidal ideas. The patient is not nervous/anxious.     Today's Vitals   11/24/18 0831  BP: (!) 144/72  Pulse: 77  Resp: 16  SpO2: 98%   Weight: 152 lb 6.4 oz (69.1 kg)  Height: 5\' 5"  (1.651 m)   Body mass index is 25.36 kg/m.  Physical Exam Vitals signs and nursing note reviewed.  Constitutional:      General: She is not in acute distress.    Appearance: Normal appearance. She is well-developed. She is not diaphoretic.  HENT:     Head: Normocephalic and atraumatic.     Mouth/Throat:     Pharynx: No oropharyngeal exudate.  Eyes:     Pupils: Pupils are equal, round, and reactive to light.  Neck:     Musculoskeletal: Normal range of motion and neck supple.     Thyroid: No thyromegaly.  Vascular: No JVD.     Trachea: No tracheal deviation.  Cardiovascular:     Rate and Rhythm: Normal rate and regular rhythm.     Heart sounds: Normal heart sounds. No murmur. No friction rub. No gallop.   Pulmonary:     Effort: Pulmonary effort is normal. No respiratory distress.     Breath sounds: Normal breath sounds. No wheezing or rales.  Chest:     Chest wall: No tenderness.  Abdominal:     General: Bowel sounds are normal.     Palpations: Abdomen is soft.     Tenderness: There is no abdominal tenderness.  Genitourinary:    Comments: Urine sample positive for protein and small blood.  Musculoskeletal: Normal range of motion.     Comments: Right hip tenderness. Worse with sitting and laying on the right side. No bony abnormalities or deformities are noted. Patient walking without limp at this time.   Lymphadenopathy:     Cervical: No cervical adenopathy.  Skin:    General: Skin is warm and dry.  Neurological:     General: No focal deficit present.     Mental Status: She is alert and oriented to person, place, and time. Mental status is at baseline.     Cranial Nerves: No cranial nerve deficit.  Psychiatric:        Behavior: Behavior normal.        Thought Content: Thought content normal.        Judgment: Judgment normal.    Assessment/Plan: 1. Urinary tract infection with hematuria, site unspecified Patient  continue to have blood and protein in urine. Round bactrim DS bid for 7 days. Continue with increased water intake to flush kidneys and bladder.  - sulfamethoxazole-trimethoprim (BACTRIM DS) 800-160 MG tablet; Take 1 tablet by mouth 2 (two) times daily.  Dispense: 14 tablet; Refill: 0  2. Pain of both hip joints Add meloxicam 7.5mg  dail as needed for pain/inflammation. Advised she follow up with orthopedic provider as scheduled.  - meloxicam (MOBIC) 7.5 MG tablet; Take 1 tablet (7.5 mg total) by mouth daily.  Dispense: 30 tablet; Refill: 2  3. Grief reaction Improved. Will monitor.   4. Screening for osteoporosis - DG Bone Density; Future  5. Dysuria - POCT Urinalysis Dipstick positive for blood and protein. Seven day course of bactrim DS for treatment.   General Counseling: yohana tasker understanding of the findings of todays visit and agrees with plan of treatment. I have discussed any further diagnostic evaluation that may be needed or ordered today. We also reviewed her medications today. she has been encouraged to call the office with any questions or concerns that should arise related to todays visit.  This patient was seen by Leretha Pol FNP Collaboration with Dr Lavera Guise as a part of collaborative care agreement  Orders Placed This Encounter  Procedures  . DG Bone Density  . POCT Urinalysis Dipstick    Meds ordered this encounter  Medications  . sulfamethoxazole-trimethoprim (BACTRIM DS) 800-160 MG tablet    Sig: Take 1 tablet by mouth 2 (two) times daily.    Dispense:  14 tablet    Refill:  0    Order Specific Question:   Supervising Provider    Answer:   Lavera Guise [id number]  . meloxicam (MOBIC) 7.5 MG tablet    Sig: Take 1 tablet (7.5 mg total) by mouth daily.    Dispense:  30 tablet    Refill:  2  Order Specific Question:   Supervising Provider    Answer:   Lavera Guise [id number]    Time spent: 11 Minutes      Dr Lavera Guise Internal medicine

## 2018-11-25 DIAGNOSIS — R69 Illness, unspecified: Secondary | ICD-10-CM | POA: Diagnosis not present

## 2018-11-27 NOTE — Progress Notes (Signed)
Patient started on round of bactrim DS while in the office.

## 2018-11-28 ENCOUNTER — Other Ambulatory Visit: Payer: Self-pay | Admitting: *Deleted

## 2018-11-28 DIAGNOSIS — R6889 Other general symptoms and signs: Secondary | ICD-10-CM | POA: Diagnosis not present

## 2018-11-28 DIAGNOSIS — Z20822 Contact with and (suspected) exposure to covid-19: Secondary | ICD-10-CM

## 2018-11-28 LAB — CULTURE, URINE COMPREHENSIVE

## 2018-11-30 LAB — NOVEL CORONAVIRUS, NAA: SARS-CoV-2, NAA: NOT DETECTED

## 2018-12-08 ENCOUNTER — Ambulatory Visit
Admission: RE | Admit: 2018-12-08 | Discharge: 2018-12-08 | Disposition: A | Discharge status: Home / Self Care | Payer: Medicare HMO | Source: Ambulatory Visit | Attending: Nurse Practitioner | Admitting: Nurse Practitioner

## 2018-12-08 DIAGNOSIS — Z780 Asymptomatic menopausal state: Secondary | ICD-10-CM | POA: Insufficient documentation

## 2018-12-08 DIAGNOSIS — Z13820 Encounter for screening for osteoporosis: Secondary | ICD-10-CM

## 2018-12-08 NOTE — Progress Notes (Signed)
Please let the patient know that her bone density test is normal. Thanks

## 2018-12-12 ENCOUNTER — Telehealth: Payer: Self-pay

## 2018-12-12 NOTE — Telephone Encounter (Signed)
Pt advised BMD is normal  

## 2018-12-12 NOTE — Telephone Encounter (Signed)
-----   Message from Ronnell Freshwater, NP sent at 12/08/2018  1:49 PM EDT ----- Please let the patient know that her bone density test is normal. Thanks

## 2018-12-14 DIAGNOSIS — R69 Illness, unspecified: Secondary | ICD-10-CM | POA: Diagnosis not present

## 2018-12-27 ENCOUNTER — Ambulatory Visit: Payer: Self-pay | Admitting: Nurse Practitioner

## 2019-01-19 ENCOUNTER — Encounter: Payer: Self-pay | Admitting: Nurse Practitioner

## 2019-01-19 ENCOUNTER — Ambulatory Visit (INDEPENDENT_AMBULATORY_CARE_PROVIDER_SITE_OTHER): Payer: Medicare HMO | Admitting: Nurse Practitioner

## 2019-01-19 ENCOUNTER — Other Ambulatory Visit: Payer: Self-pay

## 2019-01-19 VITALS — BP 132/80 | HR 73 | Temp 97.6°F | Resp 16 | Ht 65.0 in | Wt 154.0 lb

## 2019-01-19 DIAGNOSIS — R5383 Other fatigue: Secondary | ICD-10-CM

## 2019-01-19 DIAGNOSIS — K219 Gastro-esophageal reflux disease without esophagitis: Secondary | ICD-10-CM

## 2019-01-19 DIAGNOSIS — F5101 Primary insomnia: Secondary | ICD-10-CM | POA: Insufficient documentation

## 2019-01-19 DIAGNOSIS — R413 Other amnesia: Secondary | ICD-10-CM | POA: Diagnosis not present

## 2019-01-19 DIAGNOSIS — R69 Illness, unspecified: Secondary | ICD-10-CM | POA: Diagnosis not present

## 2019-01-19 MED ORDER — ZOLPIDEM TARTRATE 10 MG PO TABS: 10.0000 mg | ORAL_TABLET | Freq: Every evening | ORAL | 2 refills | Status: DC | PRN

## 2019-01-19 NOTE — Progress Notes (Signed)
[facility] [place], [place] [address]  Internal MEDICINE  Office Visit Note  Patient Name: [name] [name]  [id number]  [id number]  Date of Service: 01/19/2019   Pt is here for a sick visit.  Chief Complaint  Patient presents with  . Oral Swelling    spots and bumps on the back of tongue  . Insomnia    not sleeping well  . Hot Flashes    believes it may be the reason she is not sleeping well   . Headache    headache right before bed      The patient states that she has noted some bumps on the back of her tongue. They are non tender. She is not having problems with reflux or problems with her bowels. She states that she had this in the past, she had been having issues with her bowels.  The patient states that she has been having trouble sleeping. States that this has been issue since her mother passed away earlier this year. Made worse since she stopped taking Prempo. Is having more frequent hot flashes and night sweats. Frequently wakes up very sweaty. She states that she is not able to lay down during the day and take a nap. She states that she has taken [place] in the past. Did help her sleep and states she did not have any weird or bothersome side effects.  She states that she also stopped taking mirbetriq due to expense.        Current Medication:  Outpatient Encounter Medications as of 01/19/2019  Medication Sig  . aspirin EC 81 MG tablet Take 81 mg by mouth daily.  . Calcium Carbonate-Vit D-Min (CALCIUM 1200 PO) Take by mouth daily.  . cetirizine (ZYRTEC) 10 MG tablet Take 1 tablet (10 mg total) by mouth daily.  . Cholecalciferol (VITAMIN D) 2000 units CAPS Take 1 capsule (2,000 Units total) by mouth 2 (two) times daily.  . Clobetasol Propionate 0.05 % shampoo Use as directed twice weekly prn  . conjugated estrogens (PREMARIN) vaginal cream Use 1 applicatorful vaginally 2 times weekly  . Flaxseed, Linseed, (FLAXSEED OIL) 1000 MG CAPS Take  by mouth.  . flurandrenolide (CORDRAN) 0.05 % lotion Apply topically 2 (two) times a week. Use as directed  . Fluticasone Propionate (FLONASE NA) Place into the nose as needed.  . modafinil (PROVIGIL) 200 MG tablet Take 1 tablet (200 mg total) by mouth daily.  . Multiple Vitamins-Minerals (CENTRUM SILVER 50+WOMEN PO) Take by mouth.  . mirabegron ER (MYRBETRIQ) 50 MG TB24 tablet Take 1 tablet (50 mg total) by mouth daily. (Patient not taking: Reported on 01/19/2019)  . pantoprazole (PROTONIX) 40 MG tablet Take 1 tablet (40 mg total) by mouth daily. (Patient not taking: Reported on 01/19/2019)  . PREMPRO 0.625-2.5 MG tablet TAKE 1 TABLET BY MOUTH EVERY DAY (Patient not taking: Reported on 01/19/2019)  . zolpidem (AMBIEN) 10 MG tablet Take 1 tablet (10 mg total) by mouth at bedtime as needed for sleep.  . [DISCONTINUED] meloxicam (MOBIC) 7.5 MG tablet Take 1 tablet (7.5 mg total) by mouth daily. (Patient not taking: Reported on 01/19/2019)  . [DISCONTINUED] sulfamethoxazole-trimethoprim (BACTRIM DS) 800-160 MG tablet Take 1 tablet by mouth 2 (two) times daily. (Patient not taking: Reported on 01/19/2019)   No facility-administered encounter medications on file as of 01/19/2019.       Medical History: Past Medical History:  Diagnosis Date  . Allergic rhinitis   . Depression   .  GERD (gastroesophageal reflux disease)   . Hypertension   . Postmenopausal disorder      Today's Vitals   01/19/19 0839  BP: 132/80  Pulse: 73  Resp: 16  Temp: 97.6 F (36.4 C)  SpO2: 98%  Weight: 154 lb (69.9 kg)  Height: 5\' 5"  (1.651 m)   Body mass index is 25.63 kg/m.   Review of Systems  Constitutional: Positive for fatigue. Negative for activity change, chills and unexpected weight change.  HENT: Negative for congestion, rhinorrhea, sneezing and sore throat.        Has noted some bumps appearing on the back of her tongue since she stopped taking protonix.   Respiratory: Negative for cough, chest  tightness and shortness of breath.   Cardiovascular: Negative for chest pain and palpitations.  Gastrointestinal: Negative for abdominal pain, constipation, diarrhea, nausea and vomiting.  Endocrine: Negative for cold intolerance, heat intolerance, polydipsia and polyuria.  Musculoskeletal: Negative for arthralgias, back pain, joint swelling and neck pain.  Skin: Negative for rash.  Allergic/Immunologic: Negative for environmental allergies.  Neurological: Negative for tremors and numbness.       Continues to have issues with her memory   Hematological: Negative for adenopathy. Does not bruise/bleed easily.  Psychiatric/Behavioral: Positive for sleep disturbance. Negative for behavioral problems (Depression) and suicidal ideas. The patient is nervous/anxious.     Physical Exam Vitals signs and nursing note reviewed.  Constitutional:      General: She is not in acute distress.    Appearance: She is well-developed. She is not diaphoretic.  HENT:     Head: Normocephalic and atraumatic.     Mouth/Throat:     Pharynx: No oropharyngeal exudate.     Comments: There are some raised bumps present on the back of the tongue. Do not appear irritated.  Eyes:     Pupils: Pupils are equal, round, and reactive to light.  Neck:     Musculoskeletal: Normal range of motion and neck supple.     Thyroid: No thyromegaly.     Vascular: No JVD.     Trachea: No tracheal deviation.  Cardiovascular:     Rate and Rhythm: Normal rate and regular rhythm.     Heart sounds: Normal heart sounds. No murmur. No friction rub. No gallop.   Pulmonary:     Effort: Pulmonary effort is normal. No respiratory distress.     Breath sounds: Normal breath sounds. No wheezing or rales.  Chest:     Chest wall: No tenderness.  Abdominal:     Palpations: Abdomen is soft.  Musculoskeletal: Normal range of motion.  Lymphadenopathy:     Cervical: No cervical adenopathy.  Skin:    General: Skin is warm and dry.   Neurological:     Mental Status: She is alert and oriented to person, place, and time.     Cranial Nerves: No cranial nerve deficit.  Psychiatric:        Behavior: Behavior normal.        Thought Content: Thought content normal.        Judgment: Judgment normal.   Assessment/Plan: 1. Gastroesophageal reflux disease without esophagitis Patient has stopped taking pantoprazole. Believe bumps she is seeing on the back of her tongue may be related to irritation from increased acid reflux. Recommended she start back on pantoprazole. Will reassess at next visit.   2. Primary insomnia Start ambien 10mg  tablets. Recommend she take 1/2 tablet at bedtime as needed. May increase to 1 tablet QHS as needed  and as tolerated.  - zolpidem (AMBIEN) 10 MG tablet; Take 1 tablet (10 mg total) by mouth at bedtime as needed for sleep.  Dispense: 30 tablet; Refill: 2  3. Other fatigue Likely from inadequate sleep.   4. Memory changes Dicussed different medications to help slow progression of memory loss. She is concerned because her mother nad sister have both suffered from Alzheimer's disease. Will discuss this further at her next visit.   General Counseling: novali seabourn understanding of the findings of todays visit and agrees with plan of treatment. I have discussed any further diagnostic evaluation that may be needed or ordered today. We also reviewed her medications today. she has been encouraged to call the office with any questions or concerns that should arise related to todays visit.    Counseling:  This patient was seen by [facility] with Dr Lavera Guise as a part of collaborative care agreement  Meds ordered this encounter  Medications  . zolpidem (AMBIEN) 10 MG tablet    Sig: Take 1 tablet (10 mg total) by mouth at bedtime as needed for sleep.    Dispense:  30 tablet    Refill:  2    Order Specific Question:   Supervising Provider    Answer:   Lavera Guise [id number]     Time spent: 25 Minutes

## 2019-01-24 DIAGNOSIS — R69 Illness, unspecified: Secondary | ICD-10-CM | POA: Diagnosis not present

## 2019-02-09 ENCOUNTER — Other Ambulatory Visit: Payer: Self-pay

## 2019-02-09 ENCOUNTER — Encounter: Payer: Self-pay | Admitting: Nurse Practitioner

## 2019-02-09 ENCOUNTER — Ambulatory Visit (INDEPENDENT_AMBULATORY_CARE_PROVIDER_SITE_OTHER): Payer: Medicare HMO | Admitting: Nurse Practitioner

## 2019-02-09 VITALS — BP 142/85 | HR 73 | Temp 97.5°F | Resp 16 | Ht 65.0 in | Wt 150.2 lb

## 2019-02-09 DIAGNOSIS — R5383 Other fatigue: Secondary | ICD-10-CM | POA: Diagnosis not present

## 2019-02-09 DIAGNOSIS — F321 Major depressive disorder, single episode, moderate: Secondary | ICD-10-CM | POA: Diagnosis not present

## 2019-02-09 DIAGNOSIS — M62838 Other muscle spasm: Secondary | ICD-10-CM | POA: Insufficient documentation

## 2019-02-09 DIAGNOSIS — R69 Illness, unspecified: Secondary | ICD-10-CM | POA: Diagnosis not present

## 2019-02-09 MED ORDER — DULOXETINE HCL 20 MG PO CPEP: 20.0000 mg | ORAL_CAPSULE | Freq: Every day | ORAL | 3 refills | Status: DC

## 2019-02-09 MED ORDER — CYCLOBENZAPRINE HCL 5 MG PO TABS: 5.0000 mg | ORAL_TABLET | Freq: Two times a day (BID) | ORAL | 1 refills | Status: DC | PRN

## 2019-02-09 NOTE — Progress Notes (Signed)
[facility] [place], [place] [address]  Internal MEDICINE  Office Visit Note  Patient Name: [name] [name]  [id number]  [id number]  Date of Service: 02/09/2019   Pt is here for a sick visit.  Chief Complaint  Patient presents with  . Shoulder Pain    pain in between shoulder blades   . Neck Pain    some days radiates from neck to shoulder   . Fatigue    hurts to sleep on sides, wrists are weak, unable to hold certain weights   . Leg Problem    while walking she feels tingling in her legs, started about two weeks ago  . Anxiety    the pain she is feeling in her shoulders and neck feels like when she would get an anxiety attack      The patient is here for acute visit. States that she is having a lot of pain in upper back, mostly in between the shoulder blades, adjacent to the spine. Also having trouble sleeping on the right side. Hurts in the right side of the neck, and right hip. Can only lay on right side for about 15 minutes before she has to change to the left side. She had labs done in July, examining full iron panel along with connective tissue panel. All labs were normal. She is concerned that these symptoms are related to stress and anxiety which worsened after the death of her mother in September 30, 2018.         Current Medication:  Outpatient Encounter Medications as of 02/09/2019  Medication Sig  . aspirin EC 81 MG tablet Take 81 mg by mouth daily.  . Calcium Carbonate-Vit D-Min (CALCIUM 1200 PO) Take by mouth daily.  . cetirizine (ZYRTEC) 10 MG tablet Take 1 tablet (10 mg total) by mouth daily.  . Cholecalciferol (VITAMIN D) 2000 units CAPS Take 1 capsule (2,000 Units total) by mouth 2 (two) times daily.  . Clobetasol Propionate 0.05 % shampoo Use as directed twice weekly prn  . conjugated estrogens (PREMARIN) vaginal cream Use 1 applicatorful vaginally 2 times weekly  . Flaxseed, Linseed, (FLAXSEED OIL) 1000 MG CAPS Take by mouth.  .  flurandrenolide (CORDRAN) 0.05 % lotion Apply topically 2 (two) times a week. Use as directed  . Fluticasone Propionate (FLONASE NA) Place into the nose as needed.  . modafinil (PROVIGIL) 200 MG tablet Take 1 tablet (200 mg total) by mouth daily.  . Multiple Vitamins-Minerals (CENTRUM SILVER 50+WOMEN PO) Take by mouth.  . pantoprazole (PROTONIX) 40 MG tablet Take 1 tablet (40 mg total) by mouth daily.  Marland Kitchen PREMPRO 0.625-2.5 MG tablet TAKE 1 TABLET BY MOUTH EVERY DAY  . zolpidem (AMBIEN) 10 MG tablet Take 1 tablet (10 mg total) by mouth at bedtime as needed for sleep.  . cyclobenzaprine (FLEXERIL) 5 MG tablet Take 1 tablet (5 mg total) by mouth 2 (two) times daily as needed for muscle spasms.  . DULoxetine (CYMBALTA) 20 MG capsule Take 1 capsule (20 mg total) by mouth daily.  . [DISCONTINUED] mirabegron ER (MYRBETRIQ) 50 MG TB24 tablet Take 1 tablet (50 mg total) by mouth daily. (Patient not taking: Reported on 02/09/2019)   No facility-administered encounter medications on file as of 02/09/2019.       Medical History: Past Medical History:  Diagnosis Date  . Allergic rhinitis   . Depression   . GERD (gastroesophageal reflux disease)   . Hypertension   . Postmenopausal disorder  Today's Vitals   02/09/19 1419  BP: (!) 142/85  Pulse: 73  Resp: 16  Temp: (!) 97.5 F (36.4 C)  SpO2: 99%  Weight: 150 lb 3.2 oz (68.1 kg)  Height: 5\' 5"  (1.651 m)   Body mass index is 24.99 kg/m.   Review of Systems  Constitutional: Positive for fatigue. Negative for chills and unexpected weight change.  HENT: Negative for congestion, postnasal drip, rhinorrhea, sneezing and sore throat.   Respiratory: Negative for cough, chest tightness, shortness of breath and wheezing.   Cardiovascular: Negative for chest pain and palpitations.  Gastrointestinal: Negative for abdominal pain, constipation, diarrhea, nausea and vomiting.  Musculoskeletal: Positive for arthralgias, back pain, myalgias, neck  pain and neck stiffness. Negative for joint swelling.       Back pain in between shoulder blades. Also right shoulder pain which radiates into the neck. Right hip discomfort.   Skin: Negative for rash.  Neurological: Positive for headaches. Negative for tremors and numbness.  Hematological: Negative for adenopathy. Does not bruise/bleed easily.  Psychiatric/Behavioral: Positive for dysphoric mood and sleep disturbance. Negative for behavioral problems (Depression) and suicidal ideas. The patient is nervous/anxious.     Physical Exam Vitals signs and nursing note reviewed.  Constitutional:      General: She is not in acute distress.    Appearance: Normal appearance. She is well-developed. She is not diaphoretic.  HENT:     Head: Normocephalic and atraumatic.     Mouth/Throat:     Pharynx: No oropharyngeal exudate.  Eyes:     Pupils: Pupils are equal, round, and reactive to light.  Neck:     Musculoskeletal: Normal range of motion and neck supple. Muscular tenderness present.     Thyroid: No thyromegaly.     Vascular: No JVD.     Trachea: No tracheal deviation.  Cardiovascular:     Rate and Rhythm: Normal rate and regular rhythm.     Heart sounds: Normal heart sounds. No murmur. No friction rub. No gallop.   Pulmonary:     Effort: Pulmonary effort is normal. No respiratory distress.     Breath sounds: Normal breath sounds. No wheezing or rales.  Chest:     Chest wall: No tenderness.  Abdominal:     Palpations: Abdomen is soft.  Musculoskeletal: Normal range of motion.     Comments: Upper back pain, in between shoulder blades, adjacent to the spine. No deformities are noted. ROM and strength of the shoulders and arms is normal.   Lymphadenopathy:     Cervical: No cervical adenopathy.  Skin:    General: Skin is warm and dry.  Neurological:     Mental Status: She is alert and oriented to person, place, and time.     Cranial Nerves: No cranial nerve deficit.  Psychiatric:         Attention and Perception: Attention and perception normal.        Mood and Affect: Mood is depressed.        Speech: Speech normal.        Behavior: Behavior normal. Behavior is cooperative.        Thought Content: Thought content normal.        Cognition and Memory: Cognition and memory normal.        Judgment: Judgment normal.   Assessment/Plan:  1. Other fatigue Likely related to increased stress and anxiety. Will monitor.   2. Muscle spasm Add flexeril 5mg  twice daily as needed. Advised her of possible side  effects. She should not drive after taking this medication. She voiced understanding. Also encouraged her to use heating pad on low across upper back to help relax tight and sore muscles.  - cyclobenzaprine (FLEXERIL) 5 MG tablet; Take 1 tablet (5 mg total) by mouth 2 (two) times daily as needed for muscle spasms.  Dispense: 45 tablet; Refill: 1  3. Depression, major, single episode, moderate (HCC) Start cymbalta 20mg  every evening. Reassess at next visit. - DULoxetine (CYMBALTA) 20 MG capsule; Take 1 capsule (20 mg total) by mouth daily.  Dispense: 30 capsule; Refill: 3  General Counseling: Haliegh verbalizes understanding of the findings of todays visit and agrees with plan of treatment. I have discussed any further diagnostic evaluation that may be needed or ordered today. We also reviewed her medications today. she has been encouraged to call the office with any questions or concerns that should arise related to todays visit.    Counseling:  This patient was seen by [place] with Dr Lavera Guise as a part of collaborative care agreement  Meds ordered this encounter  Medications  . DULoxetine (CYMBALTA) 20 MG capsule    Sig: Take 1 capsule (20 mg total) by mouth daily.    Dispense:  30 capsule    Refill:  3    Order Specific Question:   Supervising Provider    Answer:   Lavera Guise [id number]  . cyclobenzaprine (FLEXERIL) 5 MG tablet    Sig: Take 1  tablet (5 mg total) by mouth 2 (two) times daily as needed for muscle spasms.    Dispense:  45 tablet    Refill:  1    Order Specific Question:   Supervising Provider    Answer:   Lavera Guise [id number]    Time spent: 25 Minutes

## 2019-02-22 ENCOUNTER — Telehealth: Payer: Self-pay

## 2019-02-22 NOTE — Telephone Encounter (Signed)
CONFIRMED AND SCREENED FOR 02-28-19 OV. °

## 2019-02-27 ENCOUNTER — Telehealth: Payer: Self-pay

## 2019-02-27 NOTE — Telephone Encounter (Signed)
CONFIRMED 02-28-19 OV AS VRTUAL VISIT.

## 2019-02-28 ENCOUNTER — Ambulatory Visit (INDEPENDENT_AMBULATORY_CARE_PROVIDER_SITE_OTHER): Payer: Medicare HMO | Admitting: Nurse Practitioner

## 2019-02-28 ENCOUNTER — Other Ambulatory Visit: Payer: Self-pay

## 2019-02-28 ENCOUNTER — Encounter: Payer: Self-pay | Admitting: Nurse Practitioner

## 2019-02-28 DIAGNOSIS — F321 Major depressive disorder, single episode, moderate: Secondary | ICD-10-CM | POA: Diagnosis not present

## 2019-02-28 DIAGNOSIS — R5383 Other fatigue: Secondary | ICD-10-CM | POA: Diagnosis not present

## 2019-02-28 DIAGNOSIS — R413 Other amnesia: Secondary | ICD-10-CM

## 2019-02-28 DIAGNOSIS — M62838 Other muscle spasm: Secondary | ICD-10-CM

## 2019-02-28 DIAGNOSIS — R69 Illness, unspecified: Secondary | ICD-10-CM | POA: Diagnosis not present

## 2019-02-28 NOTE — Progress Notes (Signed)
[facility] [place], [place] [address]  Internal MEDICINE  Telephone Visit  Patient Name: [name] [name]  [id number]  [id number]  Date of Service: 02/28/2019  I connected with the patient at 10:23am by webcam and verified the patients identity using two identifiers.   I discussed the limitations, risks, security and privacy concerns of performing an evaluation and management service by webcam and the availability of in person appointments. I also discussed with the patient that there may be a patient responsible charge related to the service.  The patient expressed understanding and agrees to proceed.    Chief Complaint  Patient presents with  . Telephone Assessment  . Telephone Screen  . Depression  . Hypertension    The patient has been contacted via webcam for follow up visit due to concerns for spread of novel coronavirus. The patient is continuing to have anxiety. A lot of this seems to be related to troubles with her memory. Her husband will come in from work and talk about things he feels she should remember. She states that this is causing some strain in the marriage. She does have appointment with Dr. Manuella Ghazi, neurologist, for further evaluation. She does have a strong family history of early onset dementia. She was started on duloxetine 20mg  daily.  She is taking this in the evening. She states that she is feeling some better since starting this medication.        Current Medication: Outpatient Encounter Medications as of 02/28/2019  Medication Sig  . aspirin EC 81 MG tablet Take 81 mg by mouth daily.  . Calcium Carbonate-Vit D-Min (CALCIUM 1200 PO) Take by mouth daily.  . cetirizine (ZYRTEC) 10 MG tablet Take 1 tablet (10 mg total) by mouth daily.  . Cholecalciferol (VITAMIN D) 2000 units CAPS Take 1 capsule (2,000 Units total) by mouth 2 (two) times daily.  . Clobetasol Propionate 0.05 % shampoo Use as directed twice weekly prn  . conjugated  estrogens (PREMARIN) vaginal cream Use 1 applicatorful vaginally 2 times weekly  . cyclobenzaprine (FLEXERIL) 5 MG tablet Take 1 tablet (5 mg total) by mouth 2 (two) times daily as needed for muscle spasms.  . DULoxetine (CYMBALTA) 20 MG capsule Take 1 capsule (20 mg total) by mouth daily.  . Flaxseed, Linseed, (FLAXSEED OIL) 1000 MG CAPS Take by mouth.  . flurandrenolide (CORDRAN) 0.05 % lotion Apply topically 2 (two) times a week. Use as directed  . Fluticasone Propionate (FLONASE NA) Place into the nose as needed.  . modafinil (PROVIGIL) 200 MG tablet Take 1 tablet (200 mg total) by mouth daily.  . Multiple Vitamins-Minerals (CENTRUM SILVER 50+WOMEN PO) Take by mouth.  . pantoprazole (PROTONIX) 40 MG tablet Take 1 tablet (40 mg total) by mouth daily.  Marland Kitchen PREMPRO 0.625-2.5 MG tablet TAKE 1 TABLET BY MOUTH EVERY DAY  . zolpidem (AMBIEN) 10 MG tablet Take 1 tablet (10 mg total) by mouth at bedtime as needed for sleep.   No facility-administered encounter medications on file as of 02/28/2019.     Surgical History: Past Surgical History:  Procedure Laterality Date  . BREAST BIOPSY Right 2016   cor bx   BENIGN BREAST LOBULES AND FIBROADIPOSE TISSUE   . ruptured disc  1992    Medical History: Past Medical History:  Diagnosis Date  . Allergic rhinitis   . Depression   . GERD (gastroesophageal reflux disease)   . Hypertension   . Postmenopausal disorder     Family History: Family  History  Problem Relation Age of Onset  . Asthma Mother   . Diabetes Mother   . Hypertension Mother   . Stroke Mother   . Epilepsy Mother   . Coronary artery disease Mother   . Breast cancer Neg Hx     Social History   Socioeconomic History  . Marital status: Married    Spouse name: Not on file  . Number of children: Not on file  . Years of education: Not on file  . Highest education level: Not on file  Occupational History  . Not on file  Social Needs  . Financial resource strain: Not on file   . Food insecurity    Worry: Not on file    Inability: Not on file  . Transportation needs    Medical: Not on file    Non-medical: Not on file  Tobacco Use  . Smoking status: Never Smoker  . Smokeless tobacco: Never Used  Substance and Sexual Activity  . Alcohol use: Yes    Frequency: Never    Comment: holidays, very rarely  . Drug use: Not Currently  . Sexual activity: Not on file  Lifestyle  . Physical activity    Days per week: Not on file    Minutes per session: Not on file  . Stress: Not on file  Relationships  . Social Herbalist on phone: Not on file    Gets together: Not on file    Attends religious service: Not on file    Active member of club or organization: Not on file    Attends meetings of clubs or organizations: Not on file    Relationship status: Not on file  . Intimate partner violence    Fear of current or ex partner: Not on file    Emotionally abused: Not on file    Physically abused: Not on file    Forced sexual activity: Not on file  Other Topics Concern  . Not on file  Social History Narrative  . Not on file      Review of Systems  Constitutional: Positive for fatigue. Negative for chills and unexpected weight change.  HENT: Negative for congestion, postnasal drip, rhinorrhea, sneezing and sore throat.   Respiratory: Negative for cough, chest tightness, shortness of breath and wheezing.   Cardiovascular: Negative for chest pain and palpitations.  Gastrointestinal: Negative for abdominal pain, constipation, diarrhea, nausea and vomiting.  Endocrine: Positive for cold intolerance. Negative for heat intolerance, polydipsia and polyuria.  Musculoskeletal: Positive for arthralgias, back pain, myalgias, neck pain and neck stiffness. Negative for joint swelling.       Back pain in between shoulder blades. Also right shoulder pain which radiates into the neck. She feels like this is tension related. She states that right hip is feeling much  better.   Skin: Negative for rash.  Allergic/Immunologic: Negative for environmental allergies.  Neurological: Positive for headaches. Negative for tremors and numbness.  Hematological: Negative for adenopathy. Does not bruise/bleed easily.  Psychiatric/Behavioral: Positive for dysphoric mood and sleep disturbance. Negative for behavioral problems (Depression) and suicidal ideas. The patient is nervous/anxious.        States that she is resting better.     Vital Signs: LMP 08/09/2014    Observation/Objective:   The patient is alert and oriented. She is pleasant and answers all questions appropriately. Breathing is non-labored. She is in no acute distress at this time.    Assessment/Plan: 1. Other fatigue Ongoing.  Feels  like this may be stress related. Consider blood work to recheck thyroid and anemia panel if persistent into the new year.   2. Depression, major, single episode, moderate (HCC) Improved. Continue duloxetine as prescribed   3. Muscle spasm Improving.  4. Memory changes Appointment scheduled with Dr. Manuella Ghazi, neurology for further evaluation 03/26/2019  General Counseling: Sandy Salaam understanding of the findings of today's phone visit and agrees with plan of treatment. I have discussed any further diagnostic evaluation that may be needed or ordered today. We also reviewed her medications today. she has been encouraged to call the office with any questions or concerns that should arise related to todays visit.  This patient was seen by Leretha Pol FNP [facility] with Dr Lavera Guise as a part of collaborative care agreement  Time spent: 67 Minutes    Dr Lavera Guise Internal medicine

## 2019-03-01 ENCOUNTER — Other Ambulatory Visit: Payer: Self-pay

## 2019-03-01 DIAGNOSIS — K219 Gastro-esophageal reflux disease without esophagitis: Secondary | ICD-10-CM

## 2019-03-01 MED ORDER — PANTOPRAZOLE SODIUM 40 MG PO TBEC: 40.0000 mg | DELAYED_RELEASE_TABLET | Freq: Every day | ORAL | 1 refills | Status: DC

## 2019-03-04 ENCOUNTER — Other Ambulatory Visit: Payer: Self-pay | Admitting: Nurse Practitioner

## 2019-03-04 DIAGNOSIS — F321 Major depressive disorder, single episode, moderate: Secondary | ICD-10-CM

## 2019-03-06 ENCOUNTER — Telehealth: Payer: Self-pay

## 2019-03-06 ENCOUNTER — Other Ambulatory Visit: Payer: Self-pay | Admitting: Nurse Practitioner

## 2019-03-06 DIAGNOSIS — G47419 Narcolepsy without cataplexy: Secondary | ICD-10-CM

## 2019-03-06 MED ORDER — MODAFINIL 200 MG PO TABS: 200.0000 mg | ORAL_TABLET | Freq: Every day | ORAL | 1 refills | Status: DC

## 2019-03-06 NOTE — Progress Notes (Signed)
Refilled her modafanil and sent to [facility]

## 2019-03-06 NOTE — Telephone Encounter (Signed)
Refilled her modafanil and sent to [facility]

## 2019-03-09 DIAGNOSIS — L720 Epidermal cyst: Secondary | ICD-10-CM | POA: Diagnosis not present

## 2019-03-22 DIAGNOSIS — R69 Illness, unspecified: Secondary | ICD-10-CM | POA: Diagnosis not present

## 2019-03-27 ENCOUNTER — Telehealth: Payer: Self-pay

## 2019-03-27 DIAGNOSIS — Z7184 Encounter for health counseling related to travel: Secondary | ICD-10-CM | POA: Diagnosis not present

## 2019-03-27 NOTE — Telephone Encounter (Signed)
Confirmed appointment with patient. klh °

## 2019-03-28 ENCOUNTER — Ambulatory Visit
Admission: RE | Admit: 2019-03-28 | Discharge: 2019-03-28 | Disposition: A | Discharge status: Home / Self Care | Payer: Medicare HMO | Source: Ambulatory Visit | Attending: Nurse Practitioner | Admitting: Nurse Practitioner

## 2019-03-28 ENCOUNTER — Other Ambulatory Visit: Payer: Self-pay

## 2019-03-28 ENCOUNTER — Encounter: Payer: Self-pay | Admitting: Nurse Practitioner

## 2019-03-28 ENCOUNTER — Ambulatory Visit (INDEPENDENT_AMBULATORY_CARE_PROVIDER_SITE_OTHER): Payer: Medicare HMO | Admitting: Nurse Practitioner

## 2019-03-28 VITALS — BP 165/84 | HR 77 | Temp 97.2°F | Resp 16 | Ht 65.0 in | Wt 150.0 lb

## 2019-03-28 DIAGNOSIS — M792 Neuralgia and neuritis, unspecified: Secondary | ICD-10-CM | POA: Diagnosis not present

## 2019-03-28 DIAGNOSIS — M545 Low back pain: Secondary | ICD-10-CM | POA: Diagnosis not present

## 2019-03-28 DIAGNOSIS — M5116 Intervertebral disc disorders with radiculopathy, lumbar region: Secondary | ICD-10-CM

## 2019-03-28 DIAGNOSIS — M5010 Cervical disc disorder with radiculopathy, unspecified cervical region: Secondary | ICD-10-CM | POA: Diagnosis not present

## 2019-03-28 DIAGNOSIS — M542 Cervicalgia: Secondary | ICD-10-CM | POA: Diagnosis not present

## 2019-03-28 DIAGNOSIS — M25511 Pain in right shoulder: Secondary | ICD-10-CM | POA: Diagnosis not present

## 2019-03-28 DIAGNOSIS — R030 Elevated blood-pressure reading, without diagnosis of hypertension: Secondary | ICD-10-CM | POA: Diagnosis not present

## 2019-03-28 NOTE — Progress Notes (Signed)
[facility] [place], [place] [address]  Internal MEDICINE  Office Visit Note  Patient Name: [name] [name]  [id number]  [id number]  Date of Service: 03/29/2019  Chief Complaint  Patient presents with  . Numbness    right shoulder down to right arm, hand, stemming from the neck, inside of knee is hurting, cannot walk longer distance, radiates halfway down leg    Ms. Schoene presents with c/o right neck and shoulder pain that radiates down her arm. The pain started approx. 1 week ago and she describes it as "aching." It is occasionally accompanied by numbness, tingling, and mild swelling in her right hand. She says that the pain is almost constant, and she currently rates the severity as 3/10. She states that the highest it has been is 8/10 on Saturday. She took Advil, which provided relief. She reports that she sleeps on her left side, and that the pain does not disturb her sleep. She denies any weakness in her shoulder or arm, but reports some decreased strength in her right wrist. She has also noticed aching pain in her right leg medially starting at the knee. It is aggravated by walking and alleviated rest, elevation, and massage. She usually walks 2 miles, 3 days a week, but has had to decrease the distance recently due to her symptoms.      Current Medication: Outpatient Encounter Medications as of 03/28/2019  Medication Sig  . aspirin EC 81 MG tablet Take 81 mg by mouth daily.  . Calcium Carbonate-Vit D-Min (CALCIUM 1200 PO) Take by mouth daily.  . cetirizine (ZYRTEC) 10 MG tablet Take 1 tablet (10 mg total) by mouth daily.  . Cholecalciferol (VITAMIN D) 2000 units CAPS Take 1 capsule (2,000 Units total) by mouth 2 (two) times daily.  . Clobetasol Propionate 0.05 % shampoo Use as directed twice weekly prn  . conjugated estrogens (PREMARIN) vaginal cream Use 1 applicatorful vaginally 2 times weekly  . cyclobenzaprine (FLEXERIL) 5 MG tablet Take 1 tablet  (5 mg total) by mouth 2 (two) times daily as needed for muscle spasms.  . DULoxetine (CYMBALTA) 20 MG capsule TAKE 1 CAPSULE BY MOUTH EVERY DAY  . Flaxseed, Linseed, (FLAXSEED OIL) 1000 MG CAPS Take by mouth.  . flurandrenolide (CORDRAN) 0.05 % lotion Apply topically 2 (two) times a week. Use as directed  . Fluticasone Propionate (FLONASE NA) Place into the nose as needed.  . modafinil (PROVIGIL) 200 MG tablet Take 1 tablet (200 mg total) by mouth daily.  . Multiple Vitamins-Minerals (CENTRUM SILVER 50+WOMEN PO) Take by mouth.  . pantoprazole (PROTONIX) 40 MG tablet Take 1 tablet (40 mg total) by mouth daily.  Marland Kitchen PREMPRO 0.625-2.5 MG tablet TAKE 1 TABLET BY MOUTH EVERY DAY  . zolpidem (AMBIEN) 10 MG tablet Take 1 tablet (10 mg total) by mouth at bedtime as needed for sleep.   No facility-administered encounter medications on file as of 03/28/2019.    Surgical History: Past Surgical History:  Procedure Laterality Date  . BREAST BIOPSY Right 2016   cor bx   BENIGN BREAST LOBULES AND FIBROADIPOSE TISSUE   . ruptured disc  1992    Medical History: Past Medical History:  Diagnosis Date  . Allergic rhinitis   . Depression   . GERD (gastroesophageal reflux disease)   . Hypertension   . Postmenopausal disorder     Family History: Family History  Problem Relation Age of Onset  . Asthma Mother   . Diabetes Mother   .  Hypertension Mother   . Stroke Mother   . Epilepsy Mother   . Coronary artery disease Mother   . Breast cancer Neg Hx     Social History   Socioeconomic History  . Marital status: Married    Spouse name: Not on file  . Number of children: Not on file  . Years of education: Not on file  . Highest education level: Not on file  Occupational History  . Not on file  Tobacco Use  . Smoking status: Never Smoker  . Smokeless tobacco: Never Used  Substance and Sexual Activity  . Alcohol use: Yes    Comment: holidays, very rarely  . Drug use: Not Currently  .  Sexual activity: Not on file  Other Topics Concern  . Not on file  Social History Narrative  . Not on file   Social Determinants of Health   Financial Resource Strain:   . Difficulty of Paying Living Expenses: Not on file  Food Insecurity:   . Worried About Charity fundraiser in the Last Year: Not on file  . Ran Out of Food in the Last Year: Not on file  Transportation Needs:   . Lack of Transportation (Medical): Not on file  . Lack of Transportation (Non-Medical): Not on file  Physical Activity:   . Days of Exercise per Week: Not on file  . Minutes of Exercise per Session: Not on file  Stress:   . Feeling of Stress : Not on file  Social Connections:   . Frequency of Communication with Friends and Family: Not on file  . Frequency of Social Gatherings with Friends and Family: Not on file  . Attends Religious Services: Not on file  . Active Member of Clubs or Organizations: Not on file  . Attends Archivist Meetings: Not on file  . Marital Status: Not on file  Intimate Partner Violence:   . Fear of Current or Ex-Partner: Not on file  . Emotionally Abused: Not on file  . Physically Abused: Not on file  . Sexually Abused: Not on file      Review of Systems  Constitutional: Positive for activity change and fatigue. Negative for chills and unexpected weight change.       Reduced activity due to pain, tingling, and weakness in her right arm and right leg.   HENT: Negative for congestion, postnasal drip, rhinorrhea, sneezing and sore throat.   Respiratory: Negative for cough, chest tightness and shortness of breath.   Cardiovascular: Negative for chest pain and palpitations.       Blood pressure mildly elevated today.   Gastrointestinal: Negative for abdominal pain, constipation, diarrhea, nausea and vomiting.  Musculoskeletal: Positive for arthralgias, joint swelling and myalgias. Negative for back pain and neck pain.  Skin: Negative for rash.  Neurological:  Positive for weakness and numbness. Negative for tremors.       Numbness and weakness present in right shoulder and arm as well as right hip and leg.   Hematological: Negative for adenopathy. Does not bruise/bleed easily.  Psychiatric/Behavioral: Negative for behavioral problems (Depression), sleep disturbance and suicidal ideas. The patient is nervous/anxious.     Today's Vitals   03/28/19 0833  BP: (!) 165/84  Pulse: 77  Resp: 16  Temp: (!) 97.2 F (36.2 C)  SpO2: 98%  Weight: 150 lb (68 kg)  Height: 5\' 5"  (1.651 m)   Body mass index is 24.96 kg/m.  Physical Exam Vitals and nursing note reviewed.  Constitutional:  Appearance: Normal appearance. She is normal weight.  Neck:     Comments: Normal ROM but pain with movement Cardiovascular:     Pulses: Normal pulses.     Heart sounds: Normal heart sounds.  Pulmonary:     Effort: Pulmonary effort is normal.     Breath sounds: Normal breath sounds.  Musculoskeletal:     Right shoulder: Normal range of motion.     Left shoulder: Normal range of motion.     Right hand: Swelling present.     Left hand: Normal.     Cervical back: Neck supple. No tenderness.     Comments: trace edema in right hand. Strength and ROM intact in right arm and wise and right leg.   Skin:    General: Skin is warm and dry.  Neurological:     Mental Status: She is alert.     Motor: No weakness.     Gait: Gait is intact.  Psychiatric:        Mood and Affect: Mood normal.        Behavior: Behavior normal.        Judgment: Judgment normal.    Assessment/Plan: 1. Radicular pain in right arm Will get x-ray of right shoulder for further evaluation. Refer to orthopedics as indicated - DG Shoulder Right; Future  2. Cervical disc disorder with radiculopathy X-ray cervical spine for further evaluation. Refer to orthopedics as indicated  - DG Cervical Spine Complete; Future  3. Lumbar disc disease with radiculopathy X-ray lumbar spine for further  evaluation. Refer to orthopedics as indicated  - DG Lumbar Spine Complete; Future  4. Elevated blood pressure reading Will monitor closely.   General Counseling: tashekia panetta understanding of the findings of todays visit and agrees with plan of treatment. I have discussed any further diagnostic evaluation that may be needed or ordered today. We also reviewed her medications today. she has been encouraged to call the office with any questions or concerns that should arise related to todays visit.    Orders Placed This Encounter  Procedures  . DG Cervical Spine Complete  . DG Shoulder Right  . DG Lumbar Spine Complete    This patient was seen by [place] with Dr Lavera Guise as a part of collaborative care agreement  Time spent: 25 Minutes      Dr Lavera Guise Internal medicine

## 2019-03-29 ENCOUNTER — Telehealth: Payer: Self-pay

## 2019-03-29 DIAGNOSIS — M792 Neuralgia and neuritis, unspecified: Secondary | ICD-10-CM | POA: Insufficient documentation

## 2019-03-29 DIAGNOSIS — R030 Elevated blood-pressure reading, without diagnosis of hypertension: Secondary | ICD-10-CM | POA: Insufficient documentation

## 2019-03-29 DIAGNOSIS — M5010 Cervical disc disorder with radiculopathy, unspecified cervical region: Secondary | ICD-10-CM | POA: Insufficient documentation

## 2019-03-29 DIAGNOSIS — M5116 Intervertebral disc disorders with radiculopathy, lumbar region: Secondary | ICD-10-CM | POA: Insufficient documentation

## 2019-03-29 NOTE — Telephone Encounter (Signed)
Pt advised for xray result also her bp was 149/90 as per heather keep check on bp and reading  And also due to her pain and she will call back with orthopedic name

## 2019-03-29 NOTE — Progress Notes (Signed)
Hey. Can you let the patient know that x-ray of low back is showing severe degenerative arthritic changes, especially at L5/S1.  Cervical spine is showing moderate degenerative changes. X-ray of her shoulder is good. I would like to go ahead and refer her to orthopedic provider/spine specialist for further evaluation and treatment. Thanks.

## 2019-03-29 NOTE — Telephone Encounter (Signed)
-----   Message from Ronnell Freshwater, NP sent at 03/29/2019  9:26 AM EST ----- Hey. Can you let the patient know that x-ray of low back is showing severe degenerative arthritic changes, especially at L5/S1.  Cervical spine is showing moderate degenerative changes. X-ray of her shoulder is good. I would like to go ahead and refe r her to orthopedic provider/spine specialist for further evaluation and treatment. Thanks.

## 2019-04-11 DIAGNOSIS — M542 Cervicalgia: Secondary | ICD-10-CM | POA: Diagnosis not present

## 2019-04-11 DIAGNOSIS — M5412 Radiculopathy, cervical region: Secondary | ICD-10-CM | POA: Diagnosis not present

## 2019-04-11 DIAGNOSIS — M5030 Other cervical disc degeneration, unspecified cervical region: Secondary | ICD-10-CM | POA: Diagnosis not present

## 2019-04-11 DIAGNOSIS — M5136 Other intervertebral disc degeneration, lumbar region: Secondary | ICD-10-CM | POA: Diagnosis not present

## 2019-04-21 ENCOUNTER — Telehealth: Payer: Self-pay

## 2019-04-21 NOTE — Telephone Encounter (Signed)
Confirmed virtual visit with patient. klh 

## 2019-04-24 ENCOUNTER — Other Ambulatory Visit: Payer: Self-pay

## 2019-04-24 ENCOUNTER — Encounter: Payer: Self-pay | Admitting: Nurse Practitioner

## 2019-04-24 ENCOUNTER — Ambulatory Visit (INDEPENDENT_AMBULATORY_CARE_PROVIDER_SITE_OTHER): Payer: Medicare HMO | Admitting: Nurse Practitioner

## 2019-04-24 VITALS — BP 134/86 | HR 98 | Temp 98.7°F | Ht 65.0 in | Wt 147.0 lb

## 2019-04-24 DIAGNOSIS — R5383 Other fatigue: Secondary | ICD-10-CM

## 2019-04-24 DIAGNOSIS — I889 Nonspecific lymphadenitis, unspecified: Secondary | ICD-10-CM | POA: Diagnosis not present

## 2019-04-24 NOTE — Progress Notes (Signed)
[facility] [place], [place] [address]  Internal MEDICINE  Telephone Visit  Patient Name: [name] [name]  [id number]  [id number]  Date of Service: 04/24/2019  I connected with the patient at 3:20pm by webcam and verified the patients identity using two identifiers.   I discussed the limitations, risks, security and privacy concerns of performing an evaluation and management service by webcam and the availability of in person appointments. I also discussed with the patient that there may be a patient responsible charge related to the service.  The patient expressed understanding and agrees to proceed.    Chief Complaint  Patient presents with  . Telephone Assessment  . Telephone Screen  . Adenopathy    left lymph node swollen  . Nasal Congestion  . Headache    The patient has been contacted via webcam for follow up visit due to concerns for spread of novel coronavirus.  The patient resents for acute visit. The patient states that she felt swollen lymph nodes in her neck, worse on the left side. She states that she has nasal congestion and fatigue. Nasal congestion that she has been getting is clear.  She states that she has had mild headache. One night, she had to get up to take two advil. This helped her headache. She denies ear pain. Denies sore throat. She denies nausea. She states that the symptoms have been going on for about two weeks. She states that She has been taking cetirizine every evening. She states that symptoms have not changed over the past two weeks.       Current Medication: Outpatient Encounter Medications as of 04/24/2019  Medication Sig  . aspirin EC 81 MG tablet Take 81 mg by mouth daily.  . Calcium Carbonate-Vit D-Min (CALCIUM 1200 PO) Take by mouth daily.  . cetirizine (ZYRTEC) 10 MG tablet Take 1 tablet (10 mg total) by mouth daily.  . Cholecalciferol (VITAMIN D) 2000 units CAPS Take 1 capsule (2,000 Units total) by mouth 2 (two)  times daily.  . Clobetasol Propionate 0.05 % shampoo Use as directed twice weekly prn  . conjugated estrogens (PREMARIN) vaginal cream Use 1 applicatorful vaginally 2 times weekly  . cyclobenzaprine (FLEXERIL) 5 MG tablet Take 1 tablet (5 mg total) by mouth 2 (two) times daily as needed for muscle spasms.  . DULoxetine (CYMBALTA) 20 MG capsule TAKE 1 CAPSULE BY MOUTH EVERY DAY  . Flaxseed, Linseed, (FLAXSEED OIL) 1000 MG CAPS Take by mouth.  . flurandrenolide (CORDRAN) 0.05 % lotion Apply topically 2 (two) times a week. Use as directed  . Fluticasone Propionate (FLONASE NA) Place into the nose as needed.  . gabapentin (NEURONTIN) 300 MG capsule Take 300 mg by mouth at bedtime.  . modafinil (PROVIGIL) 200 MG tablet Take 1 tablet (200 mg total) by mouth daily.  . Multiple Vitamins-Minerals (CENTRUM SILVER 50+WOMEN PO) Take by mouth.  . pantoprazole (PROTONIX) 40 MG tablet Take 1 tablet (40 mg total) by mouth daily.  Marland Kitchen PREMPRO 0.625-2.5 MG tablet TAKE 1 TABLET BY MOUTH EVERY DAY  . zolpidem (AMBIEN) 10 MG tablet Take 1 tablet (10 mg total) by mouth at bedtime as needed for sleep.   No facility-administered encounter medications on file as of 04/24/2019.    Surgical History: Past Surgical History:  Procedure Laterality Date  . BREAST BIOPSY Right 2016   cor bx   BENIGN BREAST LOBULES AND FIBROADIPOSE TISSUE   . ruptured disc  1992    Medical History: Past  Medical History:  Diagnosis Date  . Allergic rhinitis   . Depression   . GERD (gastroesophageal reflux disease)   . Hypertension   . Postmenopausal disorder     Family History: Family History  Problem Relation Age of Onset  . Asthma Mother   . Diabetes Mother   . Hypertension Mother   . Stroke Mother   . Epilepsy Mother   . Coronary artery disease Mother   . Breast cancer Neg Hx     Social History   Socioeconomic History  . Marital status: Married    Spouse name: Not on file  . Number of children: Not on file  .  Years of education: Not on file  . Highest education level: Not on file  Occupational History  . Not on file  Tobacco Use  . Smoking status: Never Smoker  . Smokeless tobacco: Never Used  Substance and Sexual Activity  . Alcohol use: Yes    Comment: holidays, very rarely  . Drug use: Not Currently  . Sexual activity: Not on file  Other Topics Concern  . Not on file  Social History Narrative  . Not on file   Social Determinants of Health   Financial Resource Strain:   . Difficulty of Paying Living Expenses: Not on file  Food Insecurity:   . Worried About Charity fundraiser in the Last Year: Not on file  . Ran Out of Food in the Last Year: Not on file  Transportation Needs:   . Lack of Transportation (Medical): Not on file  . Lack of Transportation (Non-Medical): Not on file  Physical Activity:   . Days of Exercise per Week: Not on file  . Minutes of Exercise per Session: Not on file  Stress:   . Feeling of Stress : Not on file  Social Connections:   . Frequency of Communication with Friends and Family: Not on file  . Frequency of Social Gatherings with Friends and Family: Not on file  . Attends Religious Services: Not on file  . Active Member of Clubs or Organizations: Not on file  . Attends Archivist Meetings: Not on file  . Marital Status: Not on file  Intimate Partner Violence:   . Fear of Current or Ex-Partner: Not on file  . Emotionally Abused: Not on file  . Physically Abused: Not on file  . Sexually Abused: Not on file      Review of Systems  Constitutional: Positive for fatigue. Negative for chills, fever and unexpected weight change.  HENT: Positive for congestion, ear pain and postnasal drip. Negative for rhinorrhea, sneezing, sore throat and voice change.   Respiratory: Negative for cough, chest tightness, shortness of breath and wheezing.   Cardiovascular: Negative for chest pain and palpitations.  Gastrointestinal: Negative for abdominal  pain, constipation, diarrhea, nausea and vomiting.  Musculoskeletal: Negative for arthralgias, back pain, joint swelling and neck pain.  Skin: Negative for rash.  Allergic/Immunologic: Positive for environmental allergies.  Neurological: Positive for headaches. Negative for tremors and numbness.       Mild headache.   Hematological: Positive for adenopathy. Does not bruise/bleed easily.       Cervical lymphadenopathy.   Psychiatric/Behavioral: Negative for behavioral problems (Depression), sleep disturbance and suicidal ideas. The patient is not nervous/anxious.     Today's Vitals   04/24/19 1457  BP: 134/86  Pulse: 98  Temp: 98.7 F (37.1 C)  Weight: 147 lb (66.7 kg)  Height: 5\' 5"  (1.651 m)  Body mass index is 24.46 kg/m.  Observation/Objective:   The patient is alert and oriented. She is pleasant and answers all questions appropriately. Breathing is non-labored. She is in no acute distress at this time.    Assessment/Plan: 1. Lymphadenitis Believe to be viral respiratory infection. Check labs. Will treat as indicate.d  - CBC w/Diff/Platelet; Future - Monospot; Future - Epstein-Barr virus VCA antibody panel; Future - Epstein-Barr virus VCA antibody panel - Monospot - CBC w/Diff/Platelet  2. Other fatigue Believe to be viral respiratory infection. Check labs. Will treat as indicate.d  - CBC w/Diff/Platelet; Future - Monospot; Future - Epstein-Barr virus VCA antibody panel; Future - Fe+TIBC+Fer; Future - B12; Future - B12 - Fe+TIBC+Fer - Epstein-Barr virus VCA antibody panel - Monospot - CBC w/Diff/Platelet  General Counseling: Taniesha verbalizes understanding of the findings of today's phone visit and agrees with plan of treatment. I have discussed any further diagnostic evaluation that may be needed or ordered today. We also reviewed her medications today. she has been encouraged to call the office with any questions or concerns that should arise related to todays  visit.    Orders Placed This Encounter  Procedures  . CBC w/Diff/Platelet  . Monospot  . Epstein-Barr virus VCA antibody panel  . Fe+TIBC+Fer  . B12    This patient was seen by [place] with Dr Lavera Guise as a part of collaborative care agreement  Time spent: 65 Minutes    Dr Lavera Guise Internal medicine

## 2019-04-25 DIAGNOSIS — D510 Vitamin B12 deficiency anemia due to intrinsic factor deficiency: Secondary | ICD-10-CM | POA: Diagnosis not present

## 2019-04-25 DIAGNOSIS — E611 Iron deficiency: Secondary | ICD-10-CM | POA: Diagnosis not present

## 2019-04-25 DIAGNOSIS — I889 Nonspecific lymphadenitis, unspecified: Secondary | ICD-10-CM | POA: Diagnosis not present

## 2019-04-25 DIAGNOSIS — D509 Iron deficiency anemia, unspecified: Secondary | ICD-10-CM | POA: Diagnosis not present

## 2019-04-25 DIAGNOSIS — R5383 Other fatigue: Secondary | ICD-10-CM | POA: Diagnosis not present

## 2019-04-25 DIAGNOSIS — D6481 Anemia due to antineoplastic chemotherapy: Secondary | ICD-10-CM | POA: Diagnosis not present

## 2019-04-26 DIAGNOSIS — E538 Deficiency of other specified B group vitamins: Secondary | ICD-10-CM | POA: Diagnosis not present

## 2019-04-26 DIAGNOSIS — G3184 Mild cognitive impairment, so stated: Secondary | ICD-10-CM | POA: Diagnosis not present

## 2019-04-26 DIAGNOSIS — G479 Sleep disorder, unspecified: Secondary | ICD-10-CM | POA: Diagnosis not present

## 2019-04-26 DIAGNOSIS — E519 Thiamine deficiency, unspecified: Secondary | ICD-10-CM | POA: Diagnosis not present

## 2019-04-26 NOTE — Progress Notes (Signed)
Waiting on all results

## 2019-04-26 NOTE — Progress Notes (Signed)
Still waiting for EBV panel

## 2019-04-27 LAB — CBC WITH DIFFERENTIAL/PLATELET
Basophils Absolute: 0.1 10*3/uL (ref 0.0–0.2)
Basos: 1 %
EOS (ABSOLUTE): 0.2 10*3/uL (ref 0.0–0.4)
Eos: 3 %
Hematocrit: 36 % (ref 34.0–46.6)
Hemoglobin: 12 g/dL (ref 11.1–15.9)
Immature Grans (Abs): 0 10*3/uL (ref 0.0–0.1)
Immature Granulocytes: 0 %
Lymphocytes Absolute: 2.4 10*3/uL (ref 0.7–3.1)
Lymphs: 38 %
MCH: 31 pg (ref 26.6–33.0)
MCHC: 33.3 g/dL (ref 31.5–35.7)
MCV: 93 fL (ref 79–97)
Monocytes Absolute: 0.6 10*3/uL (ref 0.1–0.9)
Monocytes: 9 %
Neutrophils Absolute: 3.1 10*3/uL (ref 1.4–7.0)
Neutrophils: 49 %
Platelets: 247 10*3/uL (ref 150–450)
RBC: 3.87 x10E6/uL (ref 3.77–5.28)
RDW: 10.6 % — ABNORMAL LOW (ref 11.7–15.4)
WBC: 6.3 10*3/uL (ref 3.4–10.8)

## 2019-04-27 LAB — IRON,TIBC AND FERRITIN PANEL
Ferritin: 133 ng/mL (ref 15–150)
Iron Saturation: 25 % (ref 15–55)
Iron: 70 ug/dL (ref 27–139)
Total Iron Binding Capacity: 283 ug/dL (ref 250–450)
UIBC: 213 ug/dL (ref 118–369)

## 2019-04-27 LAB — EPSTEIN-BARR VIRUS (EBV) ANTIBODY PROFILE
EBV NA IgG: 100 U/mL — ABNORMAL HIGH (ref 0.0–17.9)
EBV VCA IgG: 130 U/mL — ABNORMAL HIGH (ref 0.0–17.9)
EBV VCA IgM: <36 U/mL (ref 0.0–35.9)

## 2019-04-27 LAB — MONONUCLEOSIS SCREEN: Mono Screen: NEGATIVE

## 2019-04-27 LAB — VITAMIN B12: Vitamin B-12: >2000 pg/mL — ABNORMAL HIGH (ref 232–1245)

## 2019-04-27 NOTE — Progress Notes (Signed)
Please let the patient know that Ebstein barr virus panel positive for past infection. Same virus which causes mono but not mono. Causes similar symptoms. Enlarged lymph nodes is common as is fatigue. Marland Kitchen Has to run it's course. No set amount of time. Other labs look good. Thanks.

## 2019-04-28 ENCOUNTER — Telehealth: Payer: Self-pay

## 2019-04-28 NOTE — Telephone Encounter (Signed)
Pt advised for labs result  

## 2019-04-28 NOTE — Telephone Encounter (Signed)
-----   Message from Ronnell Freshwater, NP sent at 04/27/2019  8:30 AM EST ----- Please let the patient know that Ebstein barr virus panel positive for past infection. Same virus which causes mono but not mono. Causes similar symptoms. Enlarged lymph nodes is common as is fatigue. Marland Kitchen Has to run it's course. No set amount of time.  Other labs look good. Thanks.

## 2019-05-01 ENCOUNTER — Other Ambulatory Visit: Payer: Self-pay | Admitting: Nurse Practitioner

## 2019-05-01 DIAGNOSIS — J0140 Acute pansinusitis, unspecified: Secondary | ICD-10-CM

## 2019-05-01 MED ORDER — SULFAMETHOXAZOLE-TRIMETHOPRIM 800-160 MG PO TABS: 1.0000 | ORAL_TABLET | Freq: Two times a day (BID) | ORAL | 0 refills | Status: DC

## 2019-05-01 NOTE — Progress Notes (Signed)
Spoke with patient on the telephone regarding lab results. Discussed epstein barr virus. No proven treatment. Not contagious. She did state that she continues to have post nasal drip and sinus pain and pressure. Throat is scratchy and lymph node is still enlarged. Suspect sinus infection. Will treat with Bactrim DS bid for 7 days. Should continue with OTC allergy medication and flonase. Can treat acute symptoms with OTC medication. Patient voiced understanding.

## 2019-05-02 DIAGNOSIS — E519 Thiamine deficiency, unspecified: Secondary | ICD-10-CM | POA: Diagnosis not present

## 2019-05-02 DIAGNOSIS — E538 Deficiency of other specified B group vitamins: Secondary | ICD-10-CM | POA: Diagnosis not present

## 2019-05-02 DIAGNOSIS — G3184 Mild cognitive impairment, so stated: Secondary | ICD-10-CM | POA: Diagnosis not present

## 2019-05-09 DIAGNOSIS — R69 Illness, unspecified: Secondary | ICD-10-CM | POA: Diagnosis not present

## 2019-05-14 ENCOUNTER — Ambulatory Visit: Payer: Medicare HMO | Attending: Internal Medicine

## 2019-05-14 DIAGNOSIS — Z23 Encounter for immunization: Secondary | ICD-10-CM | POA: Insufficient documentation

## 2019-05-14 NOTE — Progress Notes (Signed)
   Covid-19 Vaccination Clinic  Name:  VELENCIA HO    MRN: [id number] DOB: [birth year]  05/14/2019  Ms. Lovern was observed post Covid-19 immunization for 15 minutes without incidence. She was provided with Vaccine Information Sheet and instruction to access the V-Safe system.   Ms. Thelen was instructed to call 911 with any severe reactions post vaccine: Marland Kitchen Difficulty breathing  . Swelling of your face and throat  . A fast heartbeat  . A bad rash all over your body  . Dizziness and weakness    Immunizations Administered    Name Date Dose VIS Date Route   Pfizer COVID-19 Vaccine 05/14/2019  2:20 PM 0.3 mL 03/10/2019 Intramuscular   Manufacturer: [place]   Lot: [id number]   [place]: [id number]

## 2019-06-06 ENCOUNTER — Ambulatory Visit: Payer: Medicare HMO | Attending: Internal Medicine

## 2019-06-06 DIAGNOSIS — Z23 Encounter for immunization: Secondary | ICD-10-CM

## 2019-06-06 NOTE — Progress Notes (Signed)
   Covid-19 Vaccination Clinic  Name:  [name] [name]    MRN: [id number] DOB: July 20, 1955  06/06/2019  [name] was observed post Covid-19 immunization for 15 minutes without incident. She was provided with Vaccine Information Sheet and instruction to access the V-Safe system.   [name] was instructed to call 911 with any severe reactions post vaccine: Marland Kitchen Difficulty breathing  . Swelling of face and throat  . A fast heartbeat  . A bad rash all over body  . Dizziness and weakness   Immunizations Administered    Name Date Dose VIS Date Route   Pfizer COVID-19 Vaccine 06/06/2019  4:50 PM 0.3 mL 03/10/2019 Intramuscular   Manufacturer: [place]   Lot: [id number]   [place]: [id number]

## 2019-06-07 ENCOUNTER — Ambulatory Visit: Payer: Medicare HMO

## 2019-06-15 ENCOUNTER — Other Ambulatory Visit: Payer: Self-pay | Admitting: Nurse Practitioner

## 2019-06-15 ENCOUNTER — Telehealth: Payer: Self-pay

## 2019-06-15 DIAGNOSIS — F5101 Primary insomnia: Secondary | ICD-10-CM

## 2019-06-15 MED ORDER — ZOLPIDEM TARTRATE 10 MG PO TABS: 10.0000 mg | ORAL_TABLET | Freq: Every evening | ORAL | 2 refills | Status: DC | PRN

## 2019-06-15 NOTE — Telephone Encounter (Signed)
Refilled prescription for ambien per patient reques

## 2019-06-15 NOTE — Progress Notes (Signed)
Refilled prescription for ambien per patient request.

## 2019-06-21 ENCOUNTER — Telehealth: Payer: Self-pay

## 2019-06-21 NOTE — Telephone Encounter (Signed)
LMOM FOR PATIENT TO CONFIRM AND SCREEN FOR 06-26-19 OV.

## 2019-06-26 ENCOUNTER — Telehealth: Payer: Self-pay | Admitting: Nurse Practitioner

## 2019-06-26 ENCOUNTER — Other Ambulatory Visit: Payer: Self-pay | Admitting: Nurse Practitioner

## 2019-06-26 ENCOUNTER — Ambulatory Visit: Payer: Self-pay | Admitting: Nurse Practitioner

## 2019-06-26 DIAGNOSIS — E559 Vitamin D deficiency, unspecified: Secondary | ICD-10-CM

## 2019-06-26 DIAGNOSIS — E02 Subclinical iodine-deficiency hypothyroidism: Secondary | ICD-10-CM

## 2019-06-26 DIAGNOSIS — Z0001 Encounter for general adult medical examination with abnormal findings: Secondary | ICD-10-CM

## 2019-06-26 DIAGNOSIS — R5383 Other fatigue: Secondary | ICD-10-CM

## 2019-06-26 NOTE — Telephone Encounter (Signed)
Patient was originally scheduled for annual wellness visit this morning and had mistaken her appointment time. I had to reschedule her appointment but she requested if you can order a thyroid panel on her labs and requested we fax them to her endocrinologist once we receive and review her results. Patient sees Lavone Orn at [facility]. Patient would like to be notified once the lab order has been placed so she knows to go have them drawn. Thank you.

## 2019-06-26 NOTE — Telephone Encounter (Signed)
Orders for routine, fasting labs, including thyroid, placed in epic.

## 2019-06-26 NOTE — Progress Notes (Signed)
Orders for routine, fasting labs, including thyroid, placed in epic.

## 2019-06-27 DIAGNOSIS — E559 Vitamin D deficiency, unspecified: Secondary | ICD-10-CM | POA: Diagnosis not present

## 2019-06-27 DIAGNOSIS — E02 Subclinical iodine-deficiency hypothyroidism: Secondary | ICD-10-CM | POA: Diagnosis not present

## 2019-06-27 DIAGNOSIS — Z0001 Encounter for general adult medical examination with abnormal findings: Secondary | ICD-10-CM | POA: Diagnosis not present

## 2019-06-27 DIAGNOSIS — R5383 Other fatigue: Secondary | ICD-10-CM | POA: Diagnosis not present

## 2019-06-27 NOTE — Telephone Encounter (Signed)
Move appt up to April 1st 2021

## 2019-06-28 NOTE — Progress Notes (Signed)
Hey. Can you let patient know that her lab results, including thyroid panel are in her MyChart. Thyroid panel is good. Other labs stable. Calcium is still, just a little elevated. We are still waiting for vitamin d results. Thanks  Can we fax copy of results to Dr. Gabriel Carina? Thanks.

## 2019-06-29 ENCOUNTER — Ambulatory Visit (INDEPENDENT_AMBULATORY_CARE_PROVIDER_SITE_OTHER): Payer: Medicare HMO | Admitting: Nurse Practitioner

## 2019-06-29 ENCOUNTER — Encounter: Payer: Self-pay | Admitting: Nurse Practitioner

## 2019-06-29 ENCOUNTER — Other Ambulatory Visit: Payer: Self-pay

## 2019-06-29 ENCOUNTER — Telehealth: Payer: Self-pay

## 2019-06-29 VITALS — BP 145/83 | HR 78 | Temp 97.3°F | Resp 16 | Ht 65.0 in | Wt 155.7 lb

## 2019-06-29 DIAGNOSIS — I889 Nonspecific lymphadenitis, unspecified: Secondary | ICD-10-CM

## 2019-06-29 DIAGNOSIS — N959 Unspecified menopausal and perimenopausal disorder: Secondary | ICD-10-CM | POA: Diagnosis not present

## 2019-06-29 DIAGNOSIS — E039 Hypothyroidism, unspecified: Secondary | ICD-10-CM

## 2019-06-29 DIAGNOSIS — E038 Other specified hypothyroidism: Secondary | ICD-10-CM

## 2019-06-29 DIAGNOSIS — Z0001 Encounter for general adult medical examination with abnormal findings: Secondary | ICD-10-CM | POA: Diagnosis not present

## 2019-06-29 DIAGNOSIS — L409 Psoriasis, unspecified: Secondary | ICD-10-CM

## 2019-06-29 DIAGNOSIS — R300 Dysuria: Secondary | ICD-10-CM | POA: Diagnosis not present

## 2019-06-29 DIAGNOSIS — R69 Illness, unspecified: Secondary | ICD-10-CM | POA: Diagnosis not present

## 2019-06-29 DIAGNOSIS — F321 Major depressive disorder, single episode, moderate: Secondary | ICD-10-CM | POA: Diagnosis not present

## 2019-06-29 MED ORDER — FLURANDRENOLIDE 0.05 % EX LOTN: TOPICAL_LOTION | CUTANEOUS | 3 refills | Status: DC

## 2019-06-29 MED ORDER — PREMPRO 0.625-2.5 MG PO TABS: 1.0000 | ORAL_TABLET | Freq: Every day | ORAL | 3 refills | Status: DC

## 2019-06-29 MED ORDER — SULFAMETHOXAZOLE-TRIMETHOPRIM 400-80 MG PO TABS: 1.0000 | ORAL_TABLET | Freq: Two times a day (BID) | ORAL | 0 refills | Status: DC

## 2019-06-29 MED ORDER — DULOXETINE HCL 20 MG PO CPEP: ORAL_CAPSULE | ORAL | 3 refills | Status: DC

## 2019-06-29 MED ORDER — CLOBETASOL PROPIONATE 0.05 % EX SHAM: MEDICATED_SHAMPOO | CUTANEOUS | 5 refills | Status: DC

## 2019-06-29 NOTE — Progress Notes (Signed)
[facility] [place], [place] [address]  Internal MEDICINE  Office Visit Note  Patient Name: [name] [name]  [id number]  [id number]  Date of Service: 07/12/2019  Chief Complaint  Patient presents with  . Medicare Wellness  . Hypertension  . Gastroesophageal Reflux  . Depression  . Referral    ENDOCRINOLOGY  . Procedure    has to have surgery for her back per ortho  . Fatigue  . Medication Refill    prempro, shampoo, lotion, premarin      The patient is here for health maintenance exam. She is c/o swelling in both hands. She has generalized joint pain without ponit tenderness. Continues to be a little depressed. Not taking antidepressant. Stopped taking this a while ago. Was on duloxetine to help with depression along with generalized arthritis. She did have labs drawn prior to this visit. Showed a mildly elevated LDL  Her thyroid panel is normal.   Pt is here for routine health maintenance examination  Current Medication: Outpatient Encounter Medications as of 06/29/2019  Medication Sig  . Ascorbic Acid (VITAMIN C PO) Take 1,000 mg by mouth daily.  Marland Kitchen aspirin EC 81 MG tablet Take 81 mg by mouth daily.  Marland Kitchen BIOTIN PO Take 81 mg by mouth 2 (two) times daily.  . cetirizine (ZYRTEC) 10 MG tablet Take 1 tablet (10 mg total) by mouth daily.  . Cholecalciferol (VITAMIN D3 PO) Take 10,000 Units by mouth daily.  . Clobetasol Propionate 0.05 % shampoo Use as directed twice weekly prn  . conjugated estrogens (PREMARIN) vaginal cream Use 1 applicatorful vaginally 2 times weekly  . DULoxetine (CYMBALTA) 20 MG capsule TAKE 1 CAPSULE BY MOUTH EVERY DAY  . estrogen, conjugated,-medroxyprogesterone (PREMPRO) 0.625-2.5 MG tablet Take 1 tablet by mouth daily.  . flurandrenolide (CORDRAN) 0.05 % lotion Apply topically 2 (two) times a week. Use as directed  . Fluticasone Propionate (FLONASE NA) Place into the nose as needed.  . Multiple Vitamins-Minerals (CENTRUM SILVER  50+WOMEN PO) Take by mouth.  . pantoprazole (PROTONIX) 40 MG tablet Take 1 tablet (40 mg total) by mouth daily.  Marland Kitchen VITAMIN E PO Take 450 mg by mouth daily.  Marland Kitchen zolpidem (AMBIEN) 10 MG tablet Take 1 tablet (10 mg total) by mouth at bedtime as needed for sleep.  . [DISCONTINUED] Clobetasol Propionate 0.05 % shampoo Use as directed twice weekly prn  . [DISCONTINUED] DULoxetine (CYMBALTA) 20 MG capsule TAKE 1 CAPSULE BY MOUTH EVERY DAY  . [DISCONTINUED] flurandrenolide (CORDRAN) 0.05 % lotion Apply topically 2 (two) times a week. Use as directed  . [DISCONTINUED] gabapentin (NEURONTIN) 300 MG capsule Take 300 mg by mouth at bedtime.  . [DISCONTINUED] PREMPRO 0.625-2.5 MG tablet TAKE 1 TABLET BY MOUTH EVERY DAY  . sulfamethoxazole-trimethoprim (BACTRIM) 400-80 MG tablet Take 1 tablet by mouth 2 (two) times daily.  . [DISCONTINUED] Calcium Carbonate-Vit D-Min (CALCIUM 1200 PO) Take by mouth daily.  . [DISCONTINUED] Cholecalciferol (VITAMIN D) 2000 units CAPS Take 1 capsule (2,000 Units total) by mouth 2 (two) times daily. (Patient not taking: Reported on 06/29/2019)  . [DISCONTINUED] cyclobenzaprine (FLEXERIL) 5 MG tablet Take 1 tablet (5 mg total) by mouth 2 (two) times daily as needed for muscle spasms. (Patient not taking: Reported on 06/29/2019)  . [DISCONTINUED] Flaxseed, Linseed, (FLAXSEED OIL) 1000 MG CAPS Take by mouth.  . [DISCONTINUED] modafinil (PROVIGIL) 200 MG tablet Take 1 tablet (200 mg total) by mouth daily. (Patient not taking: Reported on 06/29/2019)  . [DISCONTINUED] sulfamethoxazole-trimethoprim (BACTRIM  DS) 800-160 MG tablet Take 1 tablet by mouth 2 (two) times daily. (Patient not taking: Reported on 06/29/2019)   No facility-administered encounter medications on file as of 06/29/2019.    Surgical History: Past Surgical History:  Procedure Laterality Date  . BREAST BIOPSY Right 2016   cor bx   BENIGN BREAST LOBULES AND FIBROADIPOSE TISSUE   . ruptured disc  1992    Medical  History: Past Medical History:  Diagnosis Date  . Allergic rhinitis   . Depression   . GERD (gastroesophageal reflux disease)   . Hypertension   . Postmenopausal disorder     Family History: Family History  Problem Relation Age of Onset  . Asthma Mother   . Diabetes Mother   . Hypertension Mother   . Stroke Mother   . Epilepsy Mother   . Coronary artery disease Mother   . Breast cancer Neg Hx       Review of Systems  Constitutional: Positive for fatigue. Negative for chills, fever and unexpected weight change.  HENT: Positive for congestion, ear pain and postnasal drip. Negative for rhinorrhea, sneezing, sore throat and voice change.   Respiratory: Negative for cough, chest tightness, shortness of breath and wheezing.   Cardiovascular: Negative for chest pain and palpitations.  Gastrointestinal: Negative for abdominal pain, constipation, diarrhea, nausea and vomiting.  Endocrine: Negative for cold intolerance, heat intolerance, polydipsia and polyuria.       Thyroid panel normal   Musculoskeletal: Positive for arthralgias and myalgias. Negative for back pain, joint swelling and neck pain.       Swelling of the joints of hands and fingers. Limited ROM and strength due to pain and swelling.   Skin: Negative for rash.  Allergic/Immunologic: Positive for environmental allergies.  Neurological: Positive for headaches. Negative for tremors and numbness.       Mild headache.   Hematological: Positive for adenopathy. Does not bruise/bleed easily.       Cervical lymphadenopathy.   Psychiatric/Behavioral: Positive for dysphoric mood. Negative for behavioral problems (Depression), sleep disturbance and suicidal ideas. The patient is not nervous/anxious.     Today's Vitals   06/29/19 1107  BP: (!) 145/83  Pulse: 78  Resp: 16  Temp: (!) 97.3 F (36.3 C)  SpO2: 98%  Weight: 155 lb 11.2 oz (70.6 kg)  Height: 5\' 5"  (1.651 m)   Body mass index is 25.91 kg/m.  Physical  Exam Vitals and nursing note reviewed.  Constitutional:      Appearance: Normal appearance. She is normal weight.  HENT:     Nose: Congestion present.  Eyes:     Extraocular Movements: Extraocular movements intact.     Pupils: Pupils are equal, round, and reactive to light.  Neck:     Vascular: No carotid bruit.     Comments: Normal ROM but pain with movement Cardiovascular:     Rate and Rhythm: Normal rate and regular rhythm.     Pulses: Normal pulses.     Heart sounds: Normal heart sounds.  Pulmonary:     Effort: Pulmonary effort is normal.     Breath sounds: Normal breath sounds.  Chest:     Breasts:        Right: Normal. No swelling, bleeding, inverted nipple, mass, nipple discharge, skin change or tenderness.        Left: Normal. No swelling, bleeding, inverted nipple, mass, nipple discharge, skin change or tenderness.  Abdominal:     General: Bowel sounds are normal.  Palpations: Abdomen is soft.     Tenderness: There is no abdominal tenderness.  Musculoskeletal:     Right shoulder: Normal range of motion.     Left shoulder: Normal range of motion.     Right hand: Swelling present.     Left hand: Normal.     Cervical back: Neck supple. No tenderness.     Comments: trace edema in right hand. Strength and ROM intact in right arm and wise and right leg.   Lymphadenopathy:     Cervical: Cervical adenopathy present.     Upper Body:     Right upper body: No axillary adenopathy.     Left upper body: No axillary adenopathy.  Skin:    General: Skin is warm and dry.  Neurological:     General: No focal deficit present.     Mental Status: She is alert.     Motor: No weakness.     Gait: Gait is intact.  Psychiatric:        Attention and Perception: Attention and perception normal.        Mood and Affect: Affect normal. Mood is depressed.        Speech: Speech normal.        Behavior: Behavior normal. Behavior is cooperative.        Thought Content: Thought content  normal.        Cognition and Memory: Cognition and memory normal.        Judgment: Judgment normal.    Depression screen [facility] 2/9 06/29/2019 04/24/2019 02/28/2019 06/20/2018 03/28/2018  Decreased Interest 0 0 0 0 0  Down, Depressed, Hopeless 1 0 0 0 0  PHQ - 2 Score 1 0 0 0 0    Functional Status Survey: Is the patient deaf or have difficulty hearing?: No Does the patient have difficulty seeing, even when wearing glasses/contacts?: Yes(when reading sometimes) Does the patient have difficulty concentrating, remembering, or making decisions?: Yes(see neurologist) Does the patient have difficulty walking or climbing stairs?: No Does the patient have difficulty dressing or bathing?: No Does the patient have difficulty doing errands alone such as visiting a doctor's office or shopping?: No  MMSE - [place] Exam 06/29/2019  Orientation to time 5  Orientation to Place 5  Registration 3  Attention/ Calculation 5  Recall 3  Language- name 2 objects 2  Language- repeat 1  Language- follow 3 step command 3  Language- read & follow direction 1  Write a sentence 1  Copy design 1  Total score 30    Fall Risk  06/29/2019 04/24/2019 02/28/2019 10/07/2018 10/07/2018  Falls in the past year? 0 0 0 0 0      LABS: Recent Results (from the past 2160 hour(s))  B12     Status: Abnormal   Collection Time: 04/25/19  9:12 AM  Result Value Ref Range   Vitamin B-12 >2000 (H) 232 - 1245 pg/mL  Fe+TIBC+Fer     Status: None   Collection Time: 04/25/19  9:12 AM  Result Value Ref Range   Total Iron Binding Capacity 283 250 - 450 ug/dL   UIBC 213 118 - 369 ug/dL   Iron 70 27 - 139 ug/dL   Iron Saturation 25 15 - 55 %   Ferritin 133 15 - 150 ng/mL  Monospot     Status: None   Collection Time: 04/25/19  9:12 AM  Result Value Ref Range   Mono Screen Negative Negative    Comment: The  sensitivity of Heterophile antibody testing is 80-90%. [facility]ell Patient IgM testing offers higher sensitivity.   CBC  w/Diff/Platelet     Status: Abnormal   Collection Time: 04/25/19  9:12 AM  Result Value Ref Range   WBC 6.3 3.4 - 10.8 x10E3/uL   RBC 3.87 3.77 - 5.28 x10E6/uL   Hemoglobin 12.0 11.1 - 15.9 g/dL   Hematocrit 36.0 34.0 - 46.6 %   MCV 93 79 - 97 fL   MCH 31.0 26.6 - 33.0 pg   MCHC 33.3 31.5 - 35.7 g/dL   RDW 10.6 (L) 11.7 - 15.4 %   Platelets 247 150 - 450 x10E3/uL   Neutrophils 49 Not Estab. %   Lymphs 38 Not Estab. %   Monocytes 9 Not Estab. %   Eos 3 Not Estab. %   Basos 1 Not Estab. %   Neutrophils Absolute 3.1 1.4 - 7.0 x10E3/uL   Lymphocytes Absolute 2.4 0.7 - 3.1 x10E3/uL   Monocytes Absolute 0.6 0.1 - 0.9 x10E3/uL   EOS (ABSOLUTE) 0.2 0.0 - 0.4 x10E3/uL   Basophils Absolute 0.1 0.0 - 0.2 x10E3/uL   Immature Granulocytes 0 Not Estab. %   Immature Grans (Abs) 0.0 0.0 - 0.1 x10E3/uL  EPSTEIN-BARR VIRUS (EBV) Antibody Profile     Status: Abnormal   Collection Time: 04/25/19  9:12 AM  Result Value Ref Range   EBV VCA IgM <36.0 0.0 - 35.9 U/mL    Comment:                                  Negative        <36.0                                  Equivocal 36.0 - 43.9                                  Positive        >43.9    EBV VCA IgG 130.0 (H) 0.0 - 17.9 U/mL    Comment:                                  Negative        <18.0                                  Equivocal 18.0 - 21.9                                  Positive        >21.9    EBV NA IgG 100.0 (H) 0.0 - 17.9 U/mL    Comment:                                  Negative        <18.0                                  Equivocal 18.0 -  21.9                                  Positive        >21.9    Interpretation: Comment     Comment:                EBV Interpretation Chart Key: Antibody Present +    Antibody Absent - Interpretation             VCA-IgM   VCA-IgG  EBNA-IgG No previous infection/        -         -         - Susceptible Primary infection (new        +         +         - or recent) Past Infection                +or-       +         + See comment below*            +         -         - *Results indicate infection with EBV at some time  however cannot predict the timing of the infection  since antibodies to EBNA usually develop after  primary infection or, alternatively, approximately  5-10% of patients with EBV never develop antibodies  to EBNA.   Vitamin D 1,25 dihydroxy     Status: None   Collection Time: 06/27/19  1:07 PM  Result Value Ref Range   Vitamin D 1, 25 (OH)2 Total 53 pg/mL    Comment: Reference Range: Adults: 21 - 65    Vitamin D2 1, 25 (OH)2 <10 pg/mL    Comment: This test was developed and its performance characteristics determined by [facility]. It has not been cleared or approved by the Food and Drug Administration.    Vitamin D3 1, 25 (OH)2 53 pg/mL    Comment: This test was developed and its performance characteristics determined by [facility]. It has not been cleared or approved by the Food and Drug Administration.   Lipid panel     Status: Abnormal   Collection Time: 06/27/19  1:07 PM  Result Value Ref Range   Cholesterol, Total 195 100 - 199 mg/dL   Triglycerides 111 0 - 149 mg/dL   HDL 63 >39 mg/dL   VLDL Cholesterol Cal 20 5 - 40 mg/dL   LDL Chol Calc (NIH) 112 (H) 0 - 99 mg/dL   Chol/HDL Ratio 3.1 0.0 - 4.4 ratio    Comment:                                   T. Chol/HDL Ratio                                             Men  Women                               1/2 Avg.Risk  3.4    3.3  Avg.Risk  5.0    4.4                                2X Avg.Risk  9.6    7.1                                3X Avg.Risk 23.4   11.0   TSH     Status: None   Collection Time: 06/27/19  1:07 PM  Result Value Ref Range   TSH 0.976 0.450 - 4.500 uIU/mL  T4, free     Status: None   Collection Time: 06/27/19  1:07 PM  Result Value Ref Range   Free T4 1.03 0.82 - 1.77 ng/dL  Comprehensive metabolic panel     Status: None   Collection Time:  06/27/19  1:07 PM  Result Value Ref Range   Glucose 80 65 - 99 mg/dL   BUN 12 8 - 27 mg/dL   Creatinine, Ser 0.77 0.57 - 1.00 mg/dL   GFR calc non Af Amer 81 >59 mL/min/1.73   GFR calc Af Amer 94 >59 mL/min/1.73   BUN/Creatinine Ratio 16 12 - 28   Sodium 138 134 - 144 mmol/L   Potassium 4.4 3.5 - 5.2 mmol/L   Chloride 103 96 - 106 mmol/L   CO2 24 20 - 29 mmol/L   Calcium 9.6 8.7 - 10.3 mg/dL   Total Protein 7.0 6.0 - 8.5 g/dL   Albumin 4.5 3.8 - 4.8 g/dL   Globulin, Total 2.5 1.5 - 4.5 g/dL   Albumin/Globulin Ratio 1.8 1.2 - 2.2   Bilirubin Total 0.7 0.0 - 1.2 mg/dL   Alkaline Phosphatase 87 39 - 117 IU/L   AST 18 0 - 40 IU/L   ALT 13 0 - 32 IU/L  CBC with Differential/Platelet     Status: Abnormal   Collection Time: 06/27/19  1:07 PM  Result Value Ref Range   WBC 4.6 3.4 - 10.8 x10E3/uL   RBC 4.21 3.77 - 5.28 x10E6/uL   Hemoglobin 13.0 11.1 - 15.9 g/dL   Hematocrit 39.6 34.0 - 46.6 %   MCV 94 79 - 97 fL   MCH 30.9 26.6 - 33.0 pg   MCHC 32.8 31.5 - 35.7 g/dL   RDW 10.6 (L) 11.7 - 15.4 %   Platelets 258 150 - 450 x10E3/uL   Neutrophils 42 Not Estab. %   Lymphs 42 Not Estab. %   Monocytes 11 Not Estab. %   Eos 4 Not Estab. %   Basos 1 Not Estab. %   Neutrophils Absolute 1.9 1.4 - 7.0 x10E3/uL   Lymphocytes Absolute 2.0 0.7 - 3.1 x10E3/uL   Monocytes Absolute 0.5 0.1 - 0.9 x10E3/uL   EOS (ABSOLUTE) 0.2 0.0 - 0.4 x10E3/uL   Basophils Absolute 0.0 0.0 - 0.2 x10E3/uL   Immature Granulocytes 0 Not Estab. %   Immature Grans (Abs) 0.0 0.0 - 0.1 x10E3/uL  Urinalysis, Routine w reflex microscopic     Status: None   Collection Time: 06/29/19 11:00 AM  Result Value Ref Range   Specific Gravity, UA 1.024 1.005 - 1.030   pH, UA 5.0 5.0 - 7.5   Color, UA Yellow Yellow   Appearance Ur Clear Clear   Leukocytes,UA Negative Negative   Protein,UA Negative Negative/Trace   Glucose, UA Negative Negative   Ketones, UA Negative Negative   RBC,  UA Negative Negative   Bilirubin, UA  Negative Negative   Urobilinogen, Ur 0.2 0.2 - 1.0 mg/dL   Nitrite, UA Negative Negative   Microscopic Examination Comment     Comment: Microscopic not indicated and not performed.    Marland KitchenMAMM    Assessment/Plan: 1. Encounter for general adult medical examination with abnormal findings Annual health maintenance exam today  2. Lymphadenitis Palpable lymph node left cervical neck. Will do antibiotic treatment with bactrim 400/80mg  twice daily for 10 days. Will ultrasound lymph node if no improvement following this treatment. Normal WBC on recent labs.  - sulfamethoxazole-trimethoprim (BACTRIM) 400-80 MG tablet; Take 1 tablet by mouth 2 (two) times daily.  Dispense: 20 tablet; Refill: 0  3. Subclinical hypothyroidism Most recent thyroid panel normal. Will continue to monitor closely.   4. Menopausal and perimenopausal disorder Take prempro daily.  - estrogen, conjugated,-medroxyprogesterone (PREMPRO) 0.625-2.5 MG tablet; Take 1 tablet by mouth daily.  Dispense: 84 tablet; Refill: 3  5. Depression, major, single episode, moderate (HCC) Restart duloxetine 20mg  daily. Reevaluate at next visit.  - DULoxetine (CYMBALTA) 20 MG capsule; TAKE 1 CAPSULE BY MOUTH EVERY DAY  Dispense: 90 capsule; Refill: 3  6. Psoriasis of scalp Use clobetasol shampoos twice weekly and Cordran lotion twice weekly to affected areas.  - Clobetasol Propionate 0.05 % shampoo; Use as directed twice weekly prn  Dispense: 118 mL; Refill: 5 - flurandrenolide (CORDRAN) 0.05 % lotion; Apply topically 2 (two) times a week. Use as directed  Dispense: 120 mL; Refill: 3  7. Dysuria - Urinalysis, Routine w reflex microscopic  General Counseling: Sandy Salaam understanding of the findings of todays visit and agrees with plan of treatment. I have discussed any further diagnostic evaluation that may be needed or ordered today. We also reviewed her medications today. she has been encouraged to call the office with any  questions or concerns that should arise related to todays visit.    Counseling:  This patient was seen by Leretha Pol FNP Collaboration with Dr Lavera Guise as a part of collaborative care agreement  Orders Placed This Encounter  Procedures  . Urinalysis, Routine w reflex microscopic    Meds ordered this encounter  Medications  . DULoxetine (CYMBALTA) 20 MG capsule    Sig: TAKE 1 CAPSULE BY MOUTH EVERY DAY    Dispense:  90 capsule    Refill:  3    Order Specific Question:   Supervising Provider    Answer:   Lavera Guise [id number]  . estrogen, conjugated,-medroxyprogesterone (PREMPRO) 0.625-2.5 MG tablet    Sig: Take 1 tablet by mouth daily.    Dispense:  84 tablet    Refill:  3    Please fill with generic alternative preferred by patient's insurance    Order Specific Question:   Supervising Provider    Answer:   Lavera Guise [id number]  . Clobetasol Propionate 0.05 % shampoo    Sig: Use as directed twice weekly prn    Dispense:  118 mL    Refill:  5    Order Specific Question:   Supervising Provider    Answer:   Lavera Guise [place]  . flurandrenolide (CORDRAN) 0.05 % lotion    Sig: Apply topically 2 (two) times a week. Use as directed    Dispense:  120 mL    Refill:  3    Order Specific Question:   Supervising Provider    Answer:   Lavera Guise [id number]  . sulfamethoxazole-trimethoprim (BACTRIM)  400-80 MG tablet    Sig: Take 1 tablet by mouth 2 (two) times daily.    Dispense:  20 tablet    Refill:  0    Order Specific Question:   Supervising Provider    Answer:   Lavera Guise [id number]    Total time spent: 76 Minutes  Time spent includes review of chart, medications, test results, and follow up plan with the patient.     Lavera Guise, MD  Internal Medicine

## 2019-06-29 NOTE — Telephone Encounter (Signed)
Confirmed appointment on 06/29/2019 and screened for covid. [facility] 

## 2019-06-30 LAB — URINALYSIS, ROUTINE W REFLEX MICROSCOPIC
Bilirubin, UA: NEGATIVE
Glucose, UA: NEGATIVE
Ketones, UA: NEGATIVE
Leukocytes,UA: NEGATIVE
Nitrite, UA: NEGATIVE
Protein,UA: NEGATIVE
RBC, UA: NEGATIVE
Specific Gravity, UA: 1.024 (ref 1.005–1.030)
Urobilinogen, Ur: 0.2 mg/dL (ref 0.2–1.0)
pH, UA: 5 (ref 5.0–7.5)

## 2019-07-10 LAB — LIPID PANEL
Chol/HDL Ratio: 3.1 ratio (ref 0.0–4.4)
Cholesterol, Total: 195 mg/dL (ref 100–199)
HDL: 63 mg/dL (ref >39)
LDL Chol Calc (NIH): 112 mg/dL — ABNORMAL HIGH (ref 0–99)
Triglycerides: 111 mg/dL (ref 0–149)
VLDL Cholesterol Cal: 20 mg/dL (ref 5–40)

## 2019-07-10 LAB — COMPREHENSIVE METABOLIC PANEL
ALT: 13 IU/L (ref 0–32)
AST: 18 IU/L (ref 0–40)
Albumin/Globulin Ratio: 1.8 (ref 1.2–2.2)
Albumin: 4.5 g/dL (ref 3.8–4.8)
Alkaline Phosphatase: 87 IU/L (ref 39–117)
BUN/Creatinine Ratio: 16 (ref 12–28)
BUN: 12 mg/dL (ref 8–27)
Bilirubin Total: 0.7 mg/dL (ref 0.0–1.2)
CO2: 24 mmol/L (ref 20–29)
Calcium: 9.6 mg/dL (ref 8.7–10.3)
Chloride: 103 mmol/L (ref 96–106)
Creatinine, Ser: 0.77 mg/dL (ref 0.57–1.00)
GFR calc Af Amer: 94 mL/min/{1.73_m2} (ref >59)
GFR calc non Af Amer: 81 mL/min/{1.73_m2} (ref >59)
Globulin, Total: 2.5 g/dL (ref 1.5–4.5)
Glucose: 80 mg/dL (ref 65–99)
Potassium: 4.4 mmol/L (ref 3.5–5.2)
Sodium: 138 mmol/L (ref 134–144)
Total Protein: 7 g/dL (ref 6.0–8.5)

## 2019-07-10 LAB — CBC WITH DIFFERENTIAL/PLATELET
Basophils Absolute: 0 10*3/uL (ref 0.0–0.2)
Basos: 1 %
EOS (ABSOLUTE): 0.2 10*3/uL (ref 0.0–0.4)
Eos: 4 %
Hematocrit: 39.6 % (ref 34.0–46.6)
Hemoglobin: 13 g/dL (ref 11.1–15.9)
Immature Grans (Abs): 0 10*3/uL (ref 0.0–0.1)
Immature Granulocytes: 0 %
Lymphocytes Absolute: 2 10*3/uL (ref 0.7–3.1)
Lymphs: 42 %
MCH: 30.9 pg (ref 26.6–33.0)
MCHC: 32.8 g/dL (ref 31.5–35.7)
MCV: 94 fL (ref 79–97)
Monocytes Absolute: 0.5 10*3/uL (ref 0.1–0.9)
Monocytes: 11 %
Neutrophils Absolute: 1.9 10*3/uL (ref 1.4–7.0)
Neutrophils: 42 %
Platelets: 258 10*3/uL (ref 150–450)
RBC: 4.21 x10E6/uL (ref 3.77–5.28)
RDW: 10.6 % — ABNORMAL LOW (ref 11.7–15.4)
WBC: 4.6 10*3/uL (ref 3.4–10.8)

## 2019-07-10 LAB — VITAMIN D 1,25 DIHYDROXY
Vitamin D 1, 25 (OH)2 Total: 53 pg/mL
Vitamin D2 1, 25 (OH)2: <10 pg/mL
Vitamin D3 1, 25 (OH)2: 53 pg/mL

## 2019-07-10 LAB — T4, FREE: Free T4: 1.03 ng/dL (ref 0.82–1.77)

## 2019-07-10 LAB — TSH: TSH: 0.976 u[IU]/mL (ref 0.450–4.500)

## 2019-07-12 DIAGNOSIS — Z0001 Encounter for general adult medical examination with abnormal findings: Secondary | ICD-10-CM | POA: Insufficient documentation

## 2019-07-12 DIAGNOSIS — L409 Psoriasis, unspecified: Secondary | ICD-10-CM | POA: Insufficient documentation

## 2019-07-12 DIAGNOSIS — E038 Other specified hypothyroidism: Secondary | ICD-10-CM | POA: Insufficient documentation

## 2019-07-12 DIAGNOSIS — E039 Hypothyroidism, unspecified: Secondary | ICD-10-CM | POA: Insufficient documentation

## 2019-07-12 DIAGNOSIS — N959 Unspecified menopausal and perimenopausal disorder: Secondary | ICD-10-CM | POA: Insufficient documentation

## 2019-07-18 ENCOUNTER — Ambulatory Visit: Payer: Medicare HMO | Admitting: Nurse Practitioner

## 2019-07-26 ENCOUNTER — Other Ambulatory Visit: Payer: Self-pay

## 2019-07-26 MED ORDER — GABAPENTIN 300 MG PO CAPS: 300.0000 mg | ORAL_CAPSULE | Freq: Every day | ORAL | 5 refills | Status: DC

## 2019-07-27 DIAGNOSIS — R413 Other amnesia: Secondary | ICD-10-CM | POA: Diagnosis not present

## 2019-08-10 ENCOUNTER — Ambulatory Visit: Payer: Medicare HMO | Admitting: Nurse Practitioner

## 2019-08-10 DIAGNOSIS — R413 Other amnesia: Secondary | ICD-10-CM | POA: Diagnosis not present

## 2019-08-14 ENCOUNTER — Telehealth: Payer: Self-pay

## 2019-08-14 NOTE — Telephone Encounter (Signed)
PRIOR AUTHORIZATION HAS BEEN APPROVED FOR PREMPRO TABLET VALID FROM 03/31/19-03/29/2020.TAT

## 2019-08-22 ENCOUNTER — Ambulatory Visit: Payer: Medicare HMO | Admitting: Nurse Practitioner

## 2019-08-25 DIAGNOSIS — M542 Cervicalgia: Secondary | ICD-10-CM | POA: Diagnosis not present

## 2019-08-25 DIAGNOSIS — M5412 Radiculopathy, cervical region: Secondary | ICD-10-CM | POA: Diagnosis not present

## 2019-08-25 DIAGNOSIS — M5030 Other cervical disc degeneration, unspecified cervical region: Secondary | ICD-10-CM | POA: Diagnosis not present

## 2019-08-30 ENCOUNTER — Telehealth: Payer: Self-pay

## 2019-08-30 NOTE — Telephone Encounter (Signed)
Lmom to confirm and screen for 09-01-19 ov.

## 2019-09-01 ENCOUNTER — Ambulatory Visit: Payer: Medicare HMO | Admitting: Nurse Practitioner

## 2019-09-02 DIAGNOSIS — M5412 Radiculopathy, cervical region: Secondary | ICD-10-CM | POA: Diagnosis not present

## 2019-09-06 ENCOUNTER — Other Ambulatory Visit: Payer: Self-pay

## 2019-09-06 MED ORDER — MIRABEGRON ER 50 MG PO TB24: 50.0000 mg | ORAL_TABLET | Freq: Every day | ORAL | 3 refills | Status: DC

## 2019-09-11 DIAGNOSIS — M5030 Other cervical disc degeneration, unspecified cervical region: Secondary | ICD-10-CM | POA: Diagnosis not present

## 2019-09-11 DIAGNOSIS — R413 Other amnesia: Secondary | ICD-10-CM | POA: Diagnosis not present

## 2019-09-11 DIAGNOSIS — G3184 Mild cognitive impairment, so stated: Secondary | ICD-10-CM | POA: Diagnosis not present

## 2019-09-11 DIAGNOSIS — M5412 Radiculopathy, cervical region: Secondary | ICD-10-CM | POA: Diagnosis not present

## 2019-09-13 DIAGNOSIS — M542 Cervicalgia: Secondary | ICD-10-CM | POA: Diagnosis not present

## 2019-09-14 ENCOUNTER — Other Ambulatory Visit: Payer: Self-pay

## 2019-09-14 ENCOUNTER — Encounter: Payer: Self-pay | Admitting: Nurse Practitioner

## 2019-09-14 ENCOUNTER — Ambulatory Visit (INDEPENDENT_AMBULATORY_CARE_PROVIDER_SITE_OTHER): Payer: Medicare HMO | Admitting: Nurse Practitioner

## 2019-09-14 VITALS — BP 138/75 | HR 70 | Temp 97.5°F | Resp 16 | Ht 65.0 in | Wt 151.2 lb

## 2019-09-14 DIAGNOSIS — E010 Iodine-deficiency related diffuse (endemic) goiter: Secondary | ICD-10-CM

## 2019-09-14 DIAGNOSIS — R5383 Other fatigue: Secondary | ICD-10-CM | POA: Diagnosis not present

## 2019-09-14 NOTE — Progress Notes (Signed)
[facility] [place], [place] [address]  Internal MEDICINE  Office Visit Note  Patient Name: [name] [name]  [id number]  [id number]  Date of Service: 09/24/2019   Pt is here for a sick visit.  Chief Complaint  Patient presents with  . Possible enlarged thyroid    Seen on MRI about 2 weeks ago   . Fatigue    Sleeping well; but sleeping longer than usual  . Constant drainage     The patient is here for sick visit. She has been feeling fatigued, hair is falling out,  Feeling cold, brittle nails, dry skin, dry eyes, and some constipation. She states that she had MRI of her cervical spine per orthopedic provider. She states she was told there was some enlargement of the thyroid noted on the imaging. Recommended that she have thyroid scanned and levels checked. The patient has seen Dr. Gabriel Carina, endocrinology in the past and would like to have a new referral to her if thyroid ultrasound or blood work shows any abnormalities.        Current Medication:  Outpatient Encounter Medications as of 09/14/2019  Medication Sig  . Ascorbic Acid (VITAMIN C PO) Take 1,000 mg by mouth daily.  Marland Kitchen aspirin EC 81 MG tablet Take 81 mg by mouth daily.  Marland Kitchen BIOTIN PO Take 81 mg by mouth 2 (two) times daily.  . Cholecalciferol (VITAMIN D3 PO) Take 10,000 Units by mouth daily.  . Clobetasol Propionate 0.05 % shampoo Use as directed twice weekly prn  . conjugated estrogens (PREMARIN) vaginal cream Use 1 applicatorful vaginally 2 times weekly  . estrogen, conjugated,-medroxyprogesterone (PREMPRO) 0.625-2.5 MG tablet Take 1 tablet by mouth daily.  . flurandrenolide (CORDRAN) 0.05 % lotion Apply topically 2 (two) times a week. Use as directed  . Fluticasone Propionate (FLONASE NA) Place into the nose as needed.  . gabapentin (NEURONTIN) 300 MG capsule Take 1 capsule (300 mg total) by mouth at bedtime.  . mirabegron ER (MYRBETRIQ) 50 MG TB24 tablet Take 1 tablet (50 mg total) by mouth  daily.  . Multiple Vitamins-Minerals (CENTRUM SILVER 50+WOMEN PO) Take by mouth.  . pantoprazole (PROTONIX) 40 MG tablet Take 1 tablet (40 mg total) by mouth daily.  Marland Kitchen sulfamethoxazole-trimethoprim (BACTRIM) 400-80 MG tablet Take 1 tablet by mouth 2 (two) times daily.  Marland Kitchen VITAMIN E PO Take 450 mg by mouth daily.  . [DISCONTINUED] cetirizine (ZYRTEC) 10 MG tablet Take 1 tablet (10 mg total) by mouth daily.  . [DISCONTINUED] DULoxetine (CYMBALTA) 20 MG capsule TAKE 1 CAPSULE BY MOUTH EVERY DAY  . [DISCONTINUED] zolpidem (AMBIEN) 10 MG tablet Take 1 tablet (10 mg total) by mouth at bedtime as needed for sleep.   No facility-administered encounter medications on file as of 09/14/2019.      Medical History: Past Medical History:  Diagnosis Date  . Allergic rhinitis   . Depression   . GERD (gastroesophageal reflux disease)   . Hypertension   . Postmenopausal disorder      Today's Vitals   09/14/19 1135  BP: 138/75  Pulse: 70  Resp: 16  Temp: (!) 97.5 F (36.4 C)  SpO2: 97%  Weight: 151 lb 3.2 oz (68.6 kg)  Height: 5\' 5"  (1.651 m)   Body mass index is 25.16 kg/m.  Review of Systems  Constitutional: Positive for fatigue. Negative for chills, fever and unexpected weight change.  HENT: Negative for congestion, ear pain, postnasal drip, rhinorrhea, sneezing, sore throat and voice change.   Respiratory:  Negative for cough, chest tightness, shortness of breath and wheezing.   Cardiovascular: Negative for chest pain and palpitations.  Gastrointestinal: Positive for constipation. Negative for abdominal pain, diarrhea, nausea and vomiting.  Endocrine: Negative for cold intolerance, heat intolerance, polydipsia and polyuria.       Most recent thyroid panel normal.   Musculoskeletal: Positive for arthralgias and myalgias. Negative for back pain, joint swelling and neck pain.       Swelling of the joints of hands and fingers. Limited ROM and strength due to pain and swelling.   Skin:  Negative for rash.  Allergic/Immunologic: Positive for environmental allergies.  Neurological: Positive for headaches. Negative for dizziness, tremors and numbness.       Mild headache.   Hematological: Positive for adenopathy. Does not bruise/bleed easily.       Cervical lymphadenopathy.   Psychiatric/Behavioral: Positive for dysphoric mood. Negative for behavioral problems (Depression), sleep disturbance and suicidal ideas. The patient is not nervous/anxious.     Physical Exam Vitals and nursing note reviewed.  Constitutional:      Appearance: Normal appearance. She is normal weight.  HENT:     Nose: Congestion present.  Eyes:     Extraocular Movements: Extraocular movements intact.     Pupils: Pupils are equal, round, and reactive to light.  Neck:     Thyroid: Thyromegaly present.     Vascular: No carotid bruit.     Comments: Normal ROM but pain with movement Cardiovascular:     Rate and Rhythm: Normal rate and regular rhythm.     Pulses: Normal pulses.     Heart sounds: Normal heart sounds.  Pulmonary:     Effort: Pulmonary effort is normal.     Breath sounds: Normal breath sounds.  Chest:     Breasts:        Right: Normal. No swelling, bleeding, inverted nipple, mass, nipple discharge, skin change or tenderness.        Left: Normal. No swelling, bleeding, inverted nipple, mass, nipple discharge, skin change or tenderness.  Abdominal:     General: Bowel sounds are normal.     Palpations: Abdomen is soft.     Tenderness: There is no abdominal tenderness.  Musculoskeletal:     Right shoulder: Normal range of motion.     Left shoulder: Normal range of motion.     Right hand: Swelling present.     Left hand: Normal.     Cervical back: Neck supple. No tenderness.     Comments: trace edema in right hand. Strength and ROM intact in right arm and wise and right leg.   Lymphadenopathy:     Cervical: No cervical adenopathy.     Upper Body:     Right upper body: No axillary  adenopathy.     Left upper body: No axillary adenopathy.  Skin:    General: Skin is warm and dry.  Neurological:     General: No focal deficit present.     Mental Status: She is alert.     Motor: No weakness.     Gait: Gait is intact.  Psychiatric:        Attention and Perception: Attention and perception normal.        Mood and Affect: Affect normal. Mood is depressed.        Speech: Speech normal.        Behavior: Behavior normal. Behavior is cooperative.        Thought Content: Thought content normal.  Cognition and Memory: Cognition and memory normal.        Judgment: Judgment normal.    Assessment/Plan:  1. Thyromegaly Will get ultrasound of thyroid for further evaluation - US THYROID; Future  2. Other fatigue Check labs including thyroid panel for further evaluation.   General Counseling: brindle leyba understanding of the findings of todays visit and agrees with plan of treatment. I have discussed any further diagnostic evaluation that may be needed or ordered today. We also reviewed her medications today. she has been encouraged to call the office with any questions or concerns that should arise related to todays visit.    Counseling:  This patient was seen by Leretha Pol FNP Collaboration with Dr Lavera Guise as a part of collaborative care agreement  Orders Placed This Encounter  Procedures  . US THYROID      Time spent: 25 Minutes

## 2019-09-15 DIAGNOSIS — M542 Cervicalgia: Secondary | ICD-10-CM | POA: Diagnosis not present

## 2019-09-18 ENCOUNTER — Other Ambulatory Visit: Payer: Self-pay | Admitting: Nurse Practitioner

## 2019-09-18 DIAGNOSIS — D510 Vitamin B12 deficiency anemia due to intrinsic factor deficiency: Secondary | ICD-10-CM | POA: Diagnosis not present

## 2019-09-18 DIAGNOSIS — D509 Iron deficiency anemia, unspecified: Secondary | ICD-10-CM | POA: Diagnosis not present

## 2019-09-18 DIAGNOSIS — D528 Other folate deficiency anemias: Secondary | ICD-10-CM | POA: Diagnosis not present

## 2019-09-18 DIAGNOSIS — M542 Cervicalgia: Secondary | ICD-10-CM | POA: Diagnosis not present

## 2019-09-18 DIAGNOSIS — R5383 Other fatigue: Secondary | ICD-10-CM | POA: Diagnosis not present

## 2019-09-18 DIAGNOSIS — E010 Iodine-deficiency related diffuse (endemic) goiter: Secondary | ICD-10-CM | POA: Diagnosis not present

## 2019-09-19 LAB — FERRITIN: Ferritin: 121 ng/mL (ref 15–150)

## 2019-09-19 LAB — THYROGLOBULIN ANTIBODY: Thyroglobulin Antibody: <1 IU/mL (ref 0.0–0.9)

## 2019-09-19 LAB — CBC
Hematocrit: 35 % (ref 34.0–46.6)
Hemoglobin: 11.8 g/dL (ref 11.1–15.9)
MCH: 31.6 pg (ref 26.6–33.0)
MCHC: 33.7 g/dL (ref 31.5–35.7)
MCV: 94 fL (ref 79–97)
Platelets: 247 10*3/uL (ref 150–450)
RBC: 3.74 x10E6/uL — ABNORMAL LOW (ref 3.77–5.28)
RDW: 11.3 % — ABNORMAL LOW (ref 11.7–15.4)
WBC: 5.1 10*3/uL (ref 3.4–10.8)

## 2019-09-19 LAB — B12 AND FOLATE PANEL
Folate: 19.3 ng/mL (ref >3.0)
Vitamin B-12: >2000 pg/mL — ABNORMAL HIGH (ref 232–1245)

## 2019-09-19 LAB — IRON AND TIBC
Iron Saturation: 32 % (ref 15–55)
Iron: 103 ug/dL (ref 27–139)
Total Iron Binding Capacity: 320 ug/dL (ref 250–450)
UIBC: 217 ug/dL (ref 118–369)

## 2019-09-19 LAB — THYROID PEROXIDASE ANTIBODY: Thyroperoxidase Ab SerPl-aCnc: 29 IU/mL (ref 0–34)

## 2019-09-19 LAB — T3: T3, Total: 200 ng/dL — ABNORMAL HIGH (ref 71–180)

## 2019-09-19 LAB — T4, FREE: Free T4: 1.27 ng/dL (ref 0.82–1.77)

## 2019-09-19 LAB — TSH: TSH: 1.14 u[IU]/mL (ref 0.450–4.500)

## 2019-09-20 DIAGNOSIS — M542 Cervicalgia: Secondary | ICD-10-CM | POA: Diagnosis not present

## 2019-09-24 DIAGNOSIS — E010 Iodine-deficiency related diffuse (endemic) goiter: Secondary | ICD-10-CM | POA: Insufficient documentation

## 2019-09-25 ENCOUNTER — Ambulatory Visit
Admission: RE | Admit: 2019-09-25 | Discharge: 2019-09-25 | Disposition: A | Discharge status: Home / Self Care | Payer: Medicare HMO | Source: Ambulatory Visit | Attending: Nurse Practitioner | Admitting: Nurse Practitioner

## 2019-09-25 ENCOUNTER — Other Ambulatory Visit: Payer: Self-pay

## 2019-09-25 DIAGNOSIS — E010 Iodine-deficiency related diffuse (endemic) goiter: Secondary | ICD-10-CM | POA: Insufficient documentation

## 2019-09-25 DIAGNOSIS — E049 Nontoxic goiter, unspecified: Secondary | ICD-10-CM | POA: Diagnosis not present

## 2019-09-25 DIAGNOSIS — M542 Cervicalgia: Secondary | ICD-10-CM | POA: Diagnosis not present

## 2019-09-26 ENCOUNTER — Telehealth: Payer: Self-pay

## 2019-09-26 NOTE — Telephone Encounter (Signed)
Lmom to confirm and screen for 09-28-19 ov.

## 2019-09-27 NOTE — Progress Notes (Signed)
Possibly enlarged thyroid. No discrete nodules. Discuss with patient at next visit 09/28/2019

## 2019-09-27 NOTE — Progress Notes (Signed)
Overall, labs ok. Mildly hyperactive thyroid. Labs improved from before. Discuss at visit 09/28/2019

## 2019-09-28 ENCOUNTER — Encounter: Payer: Self-pay | Admitting: Nurse Practitioner

## 2019-09-28 ENCOUNTER — Other Ambulatory Visit: Payer: Self-pay

## 2019-09-28 ENCOUNTER — Ambulatory Visit (INDEPENDENT_AMBULATORY_CARE_PROVIDER_SITE_OTHER): Payer: Medicare HMO | Admitting: Nurse Practitioner

## 2019-09-28 VITALS — BP 148/76 | HR 64 | Temp 97.4°F | Resp 16 | Ht 64.0 in | Wt 151.0 lb

## 2019-09-28 DIAGNOSIS — M5010 Cervical disc disorder with radiculopathy, unspecified cervical region: Secondary | ICD-10-CM | POA: Diagnosis not present

## 2019-09-28 DIAGNOSIS — E010 Iodine-deficiency related diffuse (endemic) goiter: Secondary | ICD-10-CM | POA: Diagnosis not present

## 2019-09-28 DIAGNOSIS — R5383 Other fatigue: Secondary | ICD-10-CM

## 2019-09-28 NOTE — Progress Notes (Signed)
[facility] [place], [place] [address]  Internal MEDICINE  Office Visit Note  Patient Name: [name] [name]  [id number]  [id number]  Date of Service: 10/04/2019  Chief Complaint  Patient presents with  . Follow-up    U/s  . Hypertension  . Depression    The patient is here for routine follow up visit. She had been feeling fatigued, hair is falling out,  Feeling cold, brittle nails, dry skin, dry eyes, and some constipation. She states that she had MRI of her cervical spine per orthopedic provider. She states she was told there was some enlargement of the thyroid noted on the imaging. Recommended that she have thyroid scanned and levels checked. Since most recent visit, patient had labs done checking full thyroid panel and anemia panels. Her T3 is elevated, however, she has normal TSH and Free t4. There are negative thyroid autoantibodies. She had thyroid ultrasound showing borderline thyroid enlargement with no discrete nodules or cysts.       Current Medication: Outpatient Encounter Medications as of 09/28/2019  Medication Sig  . Ascorbic Acid (VITAMIN C PO) Take 1,000 mg by mouth daily.  Marland Kitchen aspirin EC 81 MG tablet Take 81 mg by mouth daily.  Marland Kitchen BIOTIN PO Take 81 mg by mouth 2 (two) times daily.  . Cholecalciferol (VITAMIN D3 PO) Take 10,000 Units by mouth daily.  . Clobetasol Propionate 0.05 % shampoo Use as directed twice weekly prn  . conjugated estrogens (PREMARIN) vaginal cream Use 1 applicatorful vaginally 2 times weekly  . estrogen, conjugated,-medroxyprogesterone (PREMPRO) 0.625-2.5 MG tablet Take 1 tablet by mouth daily.  . flurandrenolide (CORDRAN) 0.05 % lotion Apply topically 2 (two) times a week. Use as directed  . Fluticasone Propionate (FLONASE NA) Place into the nose as needed.  . gabapentin (NEURONTIN) 300 MG capsule Take 1 capsule (300 mg total) by mouth at bedtime.  . mirabegron ER (MYRBETRIQ) 50 MG TB24 tablet Take 1 tablet (50 mg total)  by mouth daily.  . Multiple Vitamins-Minerals (CENTRUM SILVER 50+WOMEN PO) Take by mouth.  . pantoprazole (PROTONIX) 40 MG tablet Take 1 tablet (40 mg total) by mouth daily.  Marland Kitchen sulfamethoxazole-trimethoprim (BACTRIM) 400-80 MG tablet Take 1 tablet by mouth 2 (two) times daily.  Marland Kitchen VITAMIN E PO Take 450 mg by mouth daily.   No facility-administered encounter medications on file as of 09/28/2019.    Surgical History: Past Surgical History:  Procedure Laterality Date  . BREAST BIOPSY Right 2016   cor bx   BENIGN BREAST LOBULES AND FIBROADIPOSE TISSUE   . ruptured disc  1992    Medical History: Past Medical History:  Diagnosis Date  . Allergic rhinitis   . Depression   . GERD (gastroesophageal reflux disease)   . Hypertension   . Postmenopausal disorder     Family History: Family History  Problem Relation Age of Onset  . Asthma Mother   . Diabetes Mother   . Hypertension Mother   . Stroke Mother   . Epilepsy Mother   . Coronary artery disease Mother   . Breast cancer Neg Hx     Social History   Socioeconomic History  . Marital status: Married    Spouse name: Not on file  . Number of children: Not on file  . Years of education: Not on file  . Highest education level: Not on file  Occupational History  . Not on file  Tobacco Use  . Smoking status: Never Smoker  . Smokeless tobacco:  Never Used  Vaping Use  . Vaping Use: Never used  Substance and Sexual Activity  . Alcohol use: Yes    Comment: holidays, very rarely  . Drug use: Not Currently  . Sexual activity: Not on file  Other Topics Concern  . Not on file  Social History Narrative  . Not on file   Social Determinants of Health   Financial Resource Strain:   . Difficulty of Paying Living Expenses:   Food Insecurity:   . Worried About Charity fundraiser in the Last Year:   . Arboriculturist in the Last Year:   Transportation Needs:   . Film/video editor (Medical):   Marland Kitchen Lack of Transportation  (Non-Medical):   Physical Activity:   . Days of Exercise per Week:   . Minutes of Exercise per Session:   Stress:   . Feeling of Stress :   Social Connections:   . Frequency of Communication with Friends and Family:   . Frequency of Social Gatherings with Friends and Family:   . Attends Religious Services:   . Active Member of Clubs or Organizations:   . Attends Archivist Meetings:   Marland Kitchen Marital Status:   Intimate Partner Violence:   . Fear of Current or Ex-Partner:   . Emotionally Abused:   Marland Kitchen Physically Abused:   . Sexually Abused:       Review of Systems  Constitutional: Positive for fatigue. Negative for chills, fever and unexpected weight change.  HENT: Negative for congestion, ear pain, postnasal drip, rhinorrhea, sneezing, sore throat and voice change.   Respiratory: Negative for cough, chest tightness, shortness of breath and wheezing.   Cardiovascular: Negative for chest pain and palpitations.  Gastrointestinal: Negative for abdominal pain, constipation, diarrhea, nausea and vomiting.  Endocrine: Negative for cold intolerance, heat intolerance, polydipsia and polyuria.       Most recent thyroid panel shwoing elevation of T3 with normal TSH and free T4.   Musculoskeletal: Positive for myalgias, neck pain and neck stiffness. Negative for arthralgias, back pain and joint swelling.  Skin: Negative for rash.  Allergic/Immunologic: Positive for environmental allergies.  Neurological: Positive for headaches. Negative for dizziness, tremors and numbness.       Mild headache.   Hematological: Negative for adenopathy. Does not bruise/bleed easily.       Cervical lymphadenopathy.   Psychiatric/Behavioral: Positive for dysphoric mood. Negative for behavioral problems (Depression), sleep disturbance and suicidal ideas. The patient is not nervous/anxious.     Today's Vitals   09/28/19 1154  BP: (!) 148/76  Pulse: 64  Resp: 16  Temp: (!) 97.4 F (36.3 C)  SpO2: 99%   Weight: 151 lb (68.5 kg)  Height: 5\' 4"  (1.626 m)   Body mass index is 25.92 kg/m.  Physical Exam Vitals and nursing note reviewed.  Constitutional:      Appearance: Normal appearance. She is normal weight.  HENT:     Nose: Congestion present.  Eyes:     Extraocular Movements: Extraocular movements intact.     Pupils: Pupils are equal, round, and reactive to light.  Neck:     Thyroid: Thyromegaly present.     Vascular: No carotid bruit.     Comments: Normal ROM but pain with movement Cardiovascular:     Rate and Rhythm: Normal rate and regular rhythm.     Pulses: Normal pulses.     Heart sounds: Normal heart sounds.  Pulmonary:     Effort: Pulmonary effort is  normal.     Breath sounds: Normal breath sounds.  Abdominal:     Palpations: Abdomen is soft.  Musculoskeletal:     Right shoulder: Normal range of motion.     Left shoulder: Normal range of motion.     Right hand: Swelling present.     Left hand: Normal.     Cervical back: Neck supple. No tenderness.  Lymphadenopathy:     Cervical: No cervical adenopathy.  Skin:    General: Skin is warm and dry.  Neurological:     General: No focal deficit present.     Mental Status: She is alert.     Motor: No weakness.     Gait: Gait is intact.  Psychiatric:        Attention and Perception: Attention and perception normal.        Mood and Affect: Affect normal. Mood is depressed.        Speech: Speech normal.        Behavior: Behavior normal. Behavior is cooperative.        Thought Content: Thought content normal.        Cognition and Memory: Cognition and memory normal.        Judgment: Judgment normal.    Assessment/Plan: 1. Thyromegaly Reviewed results of thyroid ultrasound with patient showing borderline thyromegaly with no discrete nodules or cysts. Will repeat ultrasound in six months and repeat thyroid labs for surveillance.  - US THYROID; Future  2. Other fatigue Improved. Reviewed labs with patiens which  were good. Will monitor.   3. Cervical disc disorder with radiculopathy Patient seeing orthopedic provider for continued management.   General Counseling: tayten heber understanding of the findings of todays visit and agrees with plan of treatment. I have discussed any further diagnostic evaluation that may be needed or ordered today. We also reviewed her medications today. she has been encouraged to call the office with any questions or concerns that should arise related to todays visit.  This patient was seen by Leretha Pol FNP Collaboration with Dr Lavera Guise as a part of collaborative care agreement  Orders Placed This Encounter  Procedures  . US THYROID     Total time spent: 25 Minutes   Time spent includes review of chart, medications, test results, and follow up plan with the patient.      Dr Lavera Guise Internal medicine

## 2019-09-29 DIAGNOSIS — M542 Cervicalgia: Secondary | ICD-10-CM | POA: Diagnosis not present

## 2019-10-04 ENCOUNTER — Telehealth: Payer: Self-pay

## 2019-10-04 NOTE — Telephone Encounter (Signed)
Left message and advised pt we rescheduled her thyroid ultrasound from July until December, not needed for 6 months. Beth

## 2019-10-10 DIAGNOSIS — G3184 Mild cognitive impairment, so stated: Secondary | ICD-10-CM | POA: Diagnosis not present

## 2019-10-10 DIAGNOSIS — G479 Sleep disorder, unspecified: Secondary | ICD-10-CM | POA: Diagnosis not present

## 2019-10-12 ENCOUNTER — Other Ambulatory Visit: Payer: Self-pay | Admitting: Nurse Practitioner

## 2019-10-12 ENCOUNTER — Other Ambulatory Visit: Payer: Self-pay | Admitting: Internal Medicine

## 2019-10-12 DIAGNOSIS — Z1231 Encounter for screening mammogram for malignant neoplasm of breast: Secondary | ICD-10-CM

## 2019-10-18 DIAGNOSIS — M542 Cervicalgia: Secondary | ICD-10-CM | POA: Diagnosis not present

## 2019-10-20 ENCOUNTER — Other Ambulatory Visit: Payer: Medicare HMO

## 2019-10-20 DIAGNOSIS — M542 Cervicalgia: Secondary | ICD-10-CM | POA: Diagnosis not present

## 2019-10-30 ENCOUNTER — Ambulatory Visit: Payer: Self-pay | Admitting: Nurse Practitioner

## 2019-11-03 ENCOUNTER — Other Ambulatory Visit: Payer: Self-pay

## 2019-11-03 DIAGNOSIS — K219 Gastro-esophageal reflux disease without esophagitis: Secondary | ICD-10-CM

## 2019-11-03 MED ORDER — PANTOPRAZOLE SODIUM 40 MG PO TBEC: 40.0000 mg | DELAYED_RELEASE_TABLET | Freq: Every day | ORAL | 1 refills | Status: DC

## 2019-11-07 DIAGNOSIS — R69 Illness, unspecified: Secondary | ICD-10-CM | POA: Diagnosis not present

## 2019-11-09 ENCOUNTER — Ambulatory Visit
Admission: RE | Admit: 2019-11-09 | Discharge: 2019-11-09 | Disposition: A | Discharge status: Home / Self Care | Payer: Medicare HMO | Source: Ambulatory Visit | Attending: Internal Medicine | Admitting: Internal Medicine

## 2019-11-09 ENCOUNTER — Other Ambulatory Visit: Payer: Self-pay

## 2019-11-09 DIAGNOSIS — Z1231 Encounter for screening mammogram for malignant neoplasm of breast: Secondary | ICD-10-CM | POA: Diagnosis not present

## 2019-11-21 DIAGNOSIS — H43813 Vitreous degeneration, bilateral: Secondary | ICD-10-CM | POA: Diagnosis not present

## 2019-11-21 DIAGNOSIS — R69 Illness, unspecified: Secondary | ICD-10-CM | POA: Diagnosis not present

## 2019-12-08 ENCOUNTER — Other Ambulatory Visit: Payer: Self-pay | Admitting: Nurse Practitioner

## 2019-12-08 ENCOUNTER — Encounter: Payer: Self-pay | Admitting: Nurse Practitioner

## 2019-12-08 ENCOUNTER — Ambulatory Visit (INDEPENDENT_AMBULATORY_CARE_PROVIDER_SITE_OTHER): Payer: Medicare HMO | Admitting: Nurse Practitioner

## 2019-12-08 ENCOUNTER — Other Ambulatory Visit: Payer: Self-pay

## 2019-12-08 VITALS — BP 121/73 | HR 97 | Temp 97.8°F | Resp 16 | Ht 64.0 in | Wt 147.2 lb

## 2019-12-08 DIAGNOSIS — J324 Chronic pansinusitis: Secondary | ICD-10-CM

## 2019-12-08 DIAGNOSIS — J309 Allergic rhinitis, unspecified: Secondary | ICD-10-CM

## 2019-12-08 DIAGNOSIS — R5383 Other fatigue: Secondary | ICD-10-CM

## 2019-12-08 DIAGNOSIS — F339 Major depressive disorder, recurrent, unspecified: Secondary | ICD-10-CM

## 2019-12-08 DIAGNOSIS — R69 Illness, unspecified: Secondary | ICD-10-CM | POA: Diagnosis not present

## 2019-12-08 MED ORDER — FLUTICASONE PROPIONATE 50 MCG/ACT NA SUSP: 2.0000 | NASAL | 2 refills | Status: DC | PRN

## 2019-12-08 MED ORDER — DESLORATADINE 5 MG PO TABS: 5.0000 mg | ORAL_TABLET | Freq: Every day | ORAL | 3 refills | Status: DC

## 2019-12-08 MED ORDER — DULOXETINE HCL 20 MG PO CPEP: 20.0000 mg | ORAL_CAPSULE | Freq: Every day | ORAL | 3 refills | Status: DC

## 2019-12-08 NOTE — Progress Notes (Signed)
[facility] [place], [place] [address]  Internal MEDICINE  Office Visit Note  Patient Name: [name] [name]  [id number]  [id number]  Date of Service: 12/14/2019  Chief Complaint  Patient presents with  . Follow-up    alegra isnt working getting headaches   . Gastroesophageal Reflux  . Hypertension  . Quality Metric Gaps    flu,tetnaus, Hep C  . Depression    The patient is here for follow u visit. She states she has nasal congestion, sinus headache, excess post nasal drip, and scratchy throat. She states that these symptoms have been going on for a few weeks. Has tried several different OTC allergy medications, including claritin, zyrtec, and allegra. None of these medications have worked. She continues to hae the same symptoms. She feels very fatigued and constantly has to blow her nose. Drainage is a yellow/green in color. She denies fever, but does have swollcal lymph nodes. She has had a few rounds of antibiotic therapy in recent months and this has not helped the symptoms at all.  She also admits to feeling depressed and down. She is unsure if this is related to recent mild memory loss, or if it more related to family stress. She does not feel as though she ever completely recovered from the death of her mother. She has been prescribed antideressants in the fast, but never did take them.       Current Medication: Outpatient Encounter Medications as of 12/08/2019  Medication Sig  . Ascorbic Acid (VITAMIN C PO) Take 1,000 mg by mouth daily.  Marland Kitchen aspirin EC 81 MG tablet Take 81 mg by mouth daily.  Marland Kitchen BIOTIN PO Take 81 mg by mouth 2 (two) times daily.  . Cholecalciferol (VITAMIN D3 PO) Take 10,000 Units by mouth daily.  . Clobetasol Propionate 0.05 % shampoo Use as directed twice weekly prn  . conjugated estrogens (PREMARIN) vaginal cream Use 1 applicatorful vaginally 2 times weekly  . estrogen, conjugated,-medroxyprogesterone (PREMPRO) 0.625-2.5 MG tablet  Take 1 tablet by mouth daily.  . flurandrenolide (CORDRAN) 0.05 % lotion Apply topically 2 (two) times a week. Use as directed  . gabapentin (NEURONTIN) 300 MG capsule Take 1 capsule (300 mg total) by mouth at bedtime.  . mirabegron ER (MYRBETRIQ) 50 MG TB24 tablet Take 1 tablet (50 mg total) by mouth daily.  . Multiple Vitamins-Minerals (CENTRUM SILVER 50+WOMEN PO) Take by mouth.  . pantoprazole (PROTONIX) 40 MG tablet Take 1 tablet (40 mg total) by mouth daily.  Marland Kitchen VITAMIN E PO Take 450 mg by mouth daily.  . [DISCONTINUED] Fluticasone Propionate (FLONASE NA) Place into the nose as needed.  . [DISCONTINUED] sulfamethoxazole-trimethoprim (BACTRIM) 400-80 MG tablet Take 1 tablet by mouth 2 (two) times daily.  Marland Kitchen desloratadine (CLARINEX) 5 MG tablet Take 1 tablet (5 mg total) by mouth daily.  . DULoxetine (CYMBALTA) 20 MG capsule Take 1 capsule (20 mg total) by mouth daily.  . fluticasone (FLONASE) 50 MCG/ACT nasal spray Place 2 sprays into both nostrils as needed.   No facility-administered encounter medications on file as of 12/08/2019.    Surgical History: Past Surgical History:  Procedure Laterality Date  . BREAST BIOPSY Right 2016   cor bx   BENIGN BREAST LOBULES AND FIBROADIPOSE TISSUE   . ruptured disc  1992    Medical History: Past Medical History:  Diagnosis Date  . Allergic rhinitis   . Depression   . GERD (gastroesophageal reflux disease)   . Hypertension   .  Postmenopausal disorder     Family History: Family History  Problem Relation Age of Onset  . Asthma Mother   . Diabetes Mother   . Hypertension Mother   . Stroke Mother   . Epilepsy Mother   . Coronary artery disease Mother   . Breast cancer Neg Hx     Social History   Socioeconomic History  . Marital status: Married    Spouse name: Not on file  . Number of children: Not on file  . Years of education: Not on file  . Highest education level: Not on file  Occupational History  . Not on file  Tobacco  Use  . Smoking status: Never Smoker  . Smokeless tobacco: Never Used  Vaping Use  . Vaping Use: Never used  Substance and Sexual Activity  . Alcohol use: Yes    Comment: holidays, very rarely  . Drug use: Not Currently  . Sexual activity: Not on file  Other Topics Concern  . Not on file  Social History Narrative  . Not on file   Social Determinants of Health   Financial Resource Strain:   . Difficulty of Paying Living Expenses: Not on file  Food Insecurity:   . Worried About Charity fundraiser in the Last Year: Not on file  . Ran Out of Food in the Last Year: Not on file  Transportation Needs:   . Lack of Transportation (Medical): Not on file  . Lack of Transportation (Non-Medical): Not on file  Physical Activity:   . Days of Exercise per Week: Not on file  . Minutes of Exercise per Session: Not on file  Stress:   . Feeling of Stress : Not on file  Social Connections:   . Frequency of Communication with Friends and Family: Not on file  . Frequency of Social Gatherings with Friends and Family: Not on file  . Attends Religious Services: Not on file  . Active Member of Clubs or Organizations: Not on file  . Attends Archivist Meetings: Not on file  . Marital Status: Not on file  Intimate Partner Violence:   . Fear of Current or Ex-Partner: Not on file  . Emotionally Abused: Not on file  . Physically Abused: Not on file  . Sexually Abused: Not on file      Review of Systems  Constitutional: Positive for activity change and fatigue. Negative for chills, fever and unexpected weight change.  HENT: Positive for congestion, postnasal drip, rhinorrhea and sinus pressure. Negative for ear pain, sneezing, sore throat and voice change.        Scratchy throat.   Respiratory: Positive for cough. Negative for chest tightness, shortness of breath and wheezing.   Cardiovascular: Negative for chest pain and palpitations.  Gastrointestinal: Negative for abdominal pain,  constipation, diarrhea, nausea and vomiting.  Endocrine: Negative for cold intolerance, heat intolerance, polydipsia and polyuria.  Musculoskeletal: Positive for myalgias, neck pain and neck stiffness. Negative for arthralgias, back pain and joint swelling.  Skin: Negative for rash.  Allergic/Immunologic: Positive for environmental allergies.  Neurological: Positive for headaches. Negative for dizziness, tremors and numbness.       Mild headache.   Hematological: Negative for adenopathy. Does not bruise/bleed easily.       Cervical lymphadenopathy.   Psychiatric/Behavioral: Positive for dysphoric mood. Negative for behavioral problems (Depression), sleep disturbance and suicidal ideas. The patient is nervous/anxious.     Today's Vitals   12/08/19 1425  BP: 121/73  Pulse: 97  Resp: 16  Temp: 97.8 F (36.6 C)  SpO2: 96%  Weight: 147 lb 3.2 oz (66.8 kg)  Height: 5\' 4"  (1.626 m)   Body mass index is 25.27 kg/m.  Physical Exam Vitals and nursing note reviewed.  Constitutional:      General: She is not in acute distress.    Appearance: Normal appearance. She is well-developed. She is not diaphoretic.  HENT:     Head: Normocephalic and atraumatic.     Right Ear: Swelling present. Tympanic membrane is bulging.     Left Ear: Swelling present. Tympanic membrane is bulging.     Nose: Congestion present.     Right Turbinates: Enlarged.     Left Turbinates: Enlarged.     Right Sinus: Frontal sinus tenderness present.     Left Sinus: Frontal sinus tenderness present.     Mouth/Throat:     Pharynx: No oropharyngeal exudate.     Comments: Post nasal drip is evident.  Eyes:     Pupils: Pupils are equal, round, and reactive to light.  Neck:     Thyroid: No thyromegaly.     Vascular: No JVD.     Trachea: No tracheal deviation.     Comments: Submandibular adenopathy present as well.  Cardiovascular:     Rate and Rhythm: Normal rate and regular rhythm.     Heart sounds: Normal heart  sounds. No murmur heard.  No friction rub. No gallop.   Pulmonary:     Effort: Pulmonary effort is normal. No respiratory distress.     Breath sounds: Normal breath sounds. No wheezing or rales.  Chest:     Chest wall: No tenderness.  Abdominal:     Palpations: Abdomen is soft.  Musculoskeletal:        General: Normal range of motion.     Cervical back: Normal range of motion and neck supple.  Lymphadenopathy:     Cervical: Cervical adenopathy present.  Skin:    General: Skin is warm and dry.  Neurological:     Mental Status: She is alert and oriented to person, place, and time.     Cranial Nerves: No cranial nerve deficit.  Psychiatric:        Attention and Perception: Attention and perception normal.        Mood and Affect: Affect normal. Mood is depressed.        Speech: Speech normal.        Behavior: Behavior normal. Behavior is cooperative.        Thought Content: Thought content normal.        Cognition and Memory: Cognition and memory normal.        Judgment: Judgment normal.    Assessment/Plan: 1. Chronic pansinusitis Due to chronic sinus problems, will get CT of sinuses for further evaluation. Refer to ENT as indicated.  - CT Maxillofacial W/Cm; Future  2. Chronic allergic rhinitis Trial clarinex 5mg  daily. Add Flonase nasal spray. Use two sprays in both nostrils daily. Will get CT sinuses for further evaluation. Consider allergy testing. - desloratadine (CLARINEX) 5 MG tablet; Take 1 tablet (5 mg total) by mouth daily.  Dispense: 30 tablet; Refill: 3 - fluticasone (FLONASE) 50 MCG/ACT nasal spray; Place 2 sprays into both nostrils as needed.  Dispense: 16 mL; Refill: 2  3. Other fatigue Recheck thyroid and anemia panels. Add connective tissue panel for further evaluation.   4. Episode of recurrent major depressive disorder, unspecified depression episode severity (HCC) Trial duloxetine 20mg  daily. Reassess  at next visit.  - DULoxetine (CYMBALTA) 20 MG capsule;  Take 1 capsule (20 mg total) by mouth daily.  Dispense: 30 capsule; Refill: 3  General Counseling: Deztinee verbalizes understanding of the findings of todays visit and agrees with plan of treatment. I have discussed any further diagnostic evaluation that may be needed or ordered today. We also reviewed her medications today. she has been encouraged to call the office with any questions or concerns that should arise related to todays visit.  This patient was seen by Leretha Pol FNP Collaboration with Dr Lavera Guise as a part of collaborative care agreement  Orders Placed This Encounter  Procedures  . CT Maxillofacial W/Cm    Meds ordered this encounter  Medications  . DULoxetine (CYMBALTA) 20 MG capsule    Sig: Take 1 capsule (20 mg total) by mouth daily.    Dispense:  30 capsule    Refill:  3    Order Specific Question:   Supervising Provider    Answer:   Lavera Guise [[id number]]  . desloratadine (CLARINEX) 5 MG tablet    Sig: Take 1 tablet (5 mg total) by mouth daily.    Dispense:  30 tablet    Refill:  3    Has tried and failed extended treatment with cetirizine and allegra    Order Specific Question:   Supervising Provider    Answer:   Lavera Guise [[id number]]  . fluticasone (FLONASE) 50 MCG/ACT nasal spray    Sig: Place 2 sprays into both nostrils as needed.    Dispense:  16 mL    Refill:  2    Order Specific Question:   Supervising Provider    Answer:   Lavera Guise [[id number]]    Total time spent: 29 Minutes   Time spent includes review of chart, medications, test results, and follow up plan with the patient.      Dr Lavera Guise Internal medicine

## 2019-12-09 LAB — CBC
Hematocrit: 36.1 % (ref 34.0–46.6)
Hemoglobin: 11.6 g/dL (ref 11.1–15.9)
MCH: 30.4 pg (ref 26.6–33.0)
MCHC: 32.1 g/dL (ref 31.5–35.7)
MCV: 95 fL (ref 79–97)
Platelets: 260 10*3/uL (ref 150–450)
RBC: 3.82 x10E6/uL (ref 3.77–5.28)
RDW: 10.8 % — ABNORMAL LOW (ref 11.7–15.4)
WBC: 5.9 10*3/uL (ref 3.4–10.8)

## 2019-12-14 DIAGNOSIS — J324 Chronic pansinusitis: Secondary | ICD-10-CM | POA: Insufficient documentation

## 2019-12-14 DIAGNOSIS — F339 Major depressive disorder, recurrent, unspecified: Secondary | ICD-10-CM | POA: Insufficient documentation

## 2019-12-14 DIAGNOSIS — J309 Allergic rhinitis, unspecified: Secondary | ICD-10-CM | POA: Insufficient documentation

## 2019-12-18 ENCOUNTER — Telehealth: Payer: Self-pay

## 2019-12-19 NOTE — Telephone Encounter (Signed)
Can we an appointment to discuss her symptoms

## 2019-12-20 NOTE — Telephone Encounter (Signed)
Pt advised we can see her tomorrow at 10

## 2019-12-21 ENCOUNTER — Encounter: Payer: Self-pay | Admitting: Internal Medicine

## 2019-12-21 ENCOUNTER — Other Ambulatory Visit: Payer: Self-pay

## 2019-12-21 ENCOUNTER — Ambulatory Visit (INDEPENDENT_AMBULATORY_CARE_PROVIDER_SITE_OTHER): Payer: Medicare HMO | Admitting: Internal Medicine

## 2019-12-21 DIAGNOSIS — K219 Gastro-esophageal reflux disease without esophagitis: Secondary | ICD-10-CM

## 2019-12-21 DIAGNOSIS — R5382 Chronic fatigue, unspecified: Secondary | ICD-10-CM

## 2019-12-21 DIAGNOSIS — Z1211 Encounter for screening for malignant neoplasm of colon: Secondary | ICD-10-CM

## 2019-12-21 MED ORDER — DULOXETINE HCL 30 MG PO CPEP: 30.0000 mg | ORAL_CAPSULE | Freq: Every day | ORAL | 3 refills | Status: DC

## 2019-12-21 NOTE — Progress Notes (Signed)
[facility] [place], [place] [address]  Internal MEDICINE  Office Visit Note  Patient Name: [name] [name]  [id number]  [id number]  Date of Service: 12/28/2019  Chief Complaint  Patient presents with  . Medication Refill    not eating   . Fatigue    very tired    HPI  Pt is here with c/o nausea,  Decreased appetite as well. Pt is responding well to Cymbalta, feels tired all the time. Labs are reviewed with her which are normal. Denies any fever or chills.?? Excessive day time fatigue    Current Medication: Outpatient Encounter Medications as of 12/21/2019  Medication Sig  . Ascorbic Acid (VITAMIN C PO) Take 1,000 mg by mouth daily.  Marland Kitchen aspirin EC 81 MG tablet Take 81 mg by mouth daily.  Marland Kitchen BIOTIN PO Take 81 mg by mouth 2 (two) times daily.  . Cholecalciferol (VITAMIN D3 PO) Take 10,000 Units by mouth daily.  . Clobetasol Propionate 0.05 % shampoo Use as directed twice weekly prn  . conjugated estrogens (PREMARIN) vaginal cream Use 1 applicatorful vaginally 2 times weekly  . desloratadine (CLARINEX) 5 MG tablet Take 1 tablet (5 mg total) by mouth daily.  . flurandrenolide (CORDRAN) 0.05 % lotion Apply topically 2 (two) times a week. Use as directed  . fluticasone (FLONASE) 50 MCG/ACT nasal spray Place 2 sprays into both nostrils as needed.  . gabapentin (NEURONTIN) 300 MG capsule Take 1 capsule (300 mg total) by mouth at bedtime.  . mirabegron ER (MYRBETRIQ) 50 MG TB24 tablet Take 1 tablet (50 mg total) by mouth daily.  . pantoprazole (PROTONIX) 40 MG tablet Take 1 tablet (40 mg total) by mouth daily.  Marland Kitchen VITAMIN E PO Take 450 mg by mouth daily.  . [DISCONTINUED] DULoxetine (CYMBALTA) 20 MG capsule Take 1 capsule (20 mg total) by mouth daily.  . DULoxetine (CYMBALTA) 30 MG capsule Take 1 capsule (30 mg total) by mouth daily.  Marland Kitchen estrogen, conjugated,-medroxyprogesterone (PREMPRO) 0.625-2.5 MG tablet Take 1 tablet by mouth daily. (Patient not taking:  Reported on 12/21/2019)  . Multiple Vitamins-Minerals (CENTRUM SILVER 50+WOMEN PO) Take by mouth. (Patient not taking: Reported on 12/21/2019)   No facility-administered encounter medications on file as of 12/21/2019.    Surgical History: Past Surgical History:  Procedure Laterality Date  . BREAST BIOPSY Right 2016   cor bx   BENIGN BREAST LOBULES AND FIBROADIPOSE TISSUE   . ruptured disc  1992    Medical History: Past Medical History:  Diagnosis Date  . Allergic rhinitis   . Depression   . GERD (gastroesophageal reflux disease)   . Hypertension   . Postmenopausal disorder     Family History: Family History  Problem Relation Age of Onset  . Asthma Mother   . Diabetes Mother   . Hypertension Mother   . Stroke Mother   . Epilepsy Mother   . Coronary artery disease Mother   . Breast cancer Neg Hx     Social History   Socioeconomic History  . Marital status: Married    Spouse name: Not on file  . Number of children: Not on file  . Years of education: Not on file  . Highest education level: Not on file  Occupational History  . Not on file  Tobacco Use  . Smoking status: Never Smoker  . Smokeless tobacco: Never Used  Vaping Use  . Vaping Use: Never used  Substance and Sexual Activity  . Alcohol use: Yes  Comment: holidays, very rarely  . Drug use: Never  . Sexual activity: Not on file  Other Topics Concern  . Not on file  Social History Narrative  . Not on file   Social Determinants of Health   Financial Resource Strain:   . Difficulty of Paying Living Expenses: Not on file  Food Insecurity:   . Worried About Charity fundraiser in the Last Year: Not on file  . Ran Out of Food in the Last Year: Not on file  Transportation Needs:   . Lack of Transportation (Medical): Not on file  . Lack of Transportation (Non-Medical): Not on file  Physical Activity:   . Days of Exercise per Week: Not on file  . Minutes of Exercise per Session: Not on file  Stress:    . Feeling of Stress : Not on file  Social Connections:   . Frequency of Communication with Friends and Family: Not on file  . Frequency of Social Gatherings with Friends and Family: Not on file  . Attends Religious Services: Not on file  . Active Member of Clubs or Organizations: Not on file  . Attends Archivist Meetings: Not on file  . Marital Status: Not on file  Intimate Partner Violence:   . Fear of Current or Ex-Partner: Not on file  . Emotionally Abused: Not on file  . Physically Abused: Not on file  . Sexually Abused: Not on file    Review of Systems  Constitutional: Positive for appetite change and fatigue. Negative for chills.  HENT: Positive for postnasal drip.   Respiratory: Negative.   Cardiovascular: Negative.   Gastrointestinal: Positive for nausea.  Musculoskeletal: Negative.   Neurological: Negative.     Vital Signs: BP 133/81   Pulse 74   Temp 97.6 F (36.4 C)   Resp 16   Ht 5\' 4"  (1.626 m)   Wt 146 lb (66.2 kg)   LMP 08/09/2014   SpO2 99%   BMI 25.06 kg/m    Physical Exam Constitutional:      Appearance: Normal appearance.  HENT:     Head: Normocephalic and atraumatic.     Nose: Nose normal.     Mouth/Throat:     Mouth: Mucous membranes are dry.  Eyes:     Extraocular Movements: Extraocular movements intact.     Pupils: Pupils are equal, round, and reactive to light.  Cardiovascular:     Rate and Rhythm: Regular rhythm.  Abdominal:     General: Abdomen is flat.  Neurological:     General: No focal deficit present.     Mental Status: She is alert and oriented to person, place, and time.  Psychiatric:        Mood and Affect: Mood normal.        Behavior: Behavior normal.    Assessment/Plan: 1. Chronic fatigue She needs to have a formal evaluation for depression or chronic fatigue or Narcolepsy. However increase Cymbalta for now  - DULoxetine (CYMBALTA) 30 MG capsule; Take 1 capsule (30 mg total) by mouth daily.  Dispense:  30 capsule; Refill: 3  2. Gastroesophageal reflux disease without esophagitis Pt needs to see GI for ongoing nausea and lack of appetite, mild wt loss is present  - Ambulatory referral to Gastroenterology  3. Screening for colon cancer - Ambulatory referral to Gastroenterology  General Counseling: Sandy Salaam understanding of the findings of todays visit and agrees with plan of treatment. I have discussed any further diagnostic evaluation that may  be needed or ordered today. We also reviewed her medications today. she has been encouraged to call the office with any questions or concerns that should arise related to todays visit.    Orders Placed This Encounter  Procedures  . Ambulatory referral to Gastroenterology    Meds ordered this encounter  Medications  . DULoxetine (CYMBALTA) 30 MG capsule    Sig: Take 1 capsule (30 mg total) by mouth daily.    Dispense:  30 capsule    Refill:  3    Total time spent35 Minutes Time spent includes review of chart, medications, test results, and follow up plan with the patient.      Dr Lavera Guise Internal medicine

## 2019-12-26 ENCOUNTER — Telehealth: Payer: Self-pay

## 2019-12-26 NOTE — Telephone Encounter (Signed)
Patient called and requesting GI appt sooner then 02/2020. I have sent dr Humphrey Rolls a message to see if she put the request in with GI for provider to provider request. beth

## 2020-01-03 DIAGNOSIS — Z0100 Encounter for examination of eyes and vision without abnormal findings: Secondary | ICD-10-CM | POA: Diagnosis not present

## 2020-01-03 DIAGNOSIS — H524 Presbyopia: Secondary | ICD-10-CM | POA: Diagnosis not present

## 2020-01-05 ENCOUNTER — Ambulatory Visit: Payer: Medicare HMO | Admitting: Nurse Practitioner

## 2020-01-08 ENCOUNTER — Other Ambulatory Visit: Payer: Self-pay

## 2020-01-08 MED ORDER — MIRABEGRON ER 50 MG PO TB24
50.0000 mg | ORAL_TABLET | Freq: Every day | ORAL | 3 refills | Status: DC
Start: 2020-01-08 — End: 2020-05-06

## 2020-01-24 ENCOUNTER — Other Ambulatory Visit: Payer: Self-pay | Admitting: Internal Medicine

## 2020-01-24 ENCOUNTER — Other Ambulatory Visit: Payer: Self-pay

## 2020-01-24 DIAGNOSIS — R5382 Chronic fatigue, unspecified: Secondary | ICD-10-CM

## 2020-01-24 MED ORDER — DULOXETINE HCL 30 MG PO CPEP: 30.0000 mg | ORAL_CAPSULE | Freq: Every day | ORAL | 1 refills | Status: DC

## 2020-01-27 ENCOUNTER — Ambulatory Visit: Payer: Medicare HMO | Attending: Internal Medicine

## 2020-01-27 DIAGNOSIS — Z23 Encounter for immunization: Secondary | ICD-10-CM

## 2020-01-27 IMAGING — MG DIGITAL DIAGNOSTIC BILATERAL MAMMOGRAM WITH TOMO AND CAD
6 of 12 series · 6 of 36 positions shown · non-contrast
Comparison: Previous exam(s).

CLINICAL DATA: Screening recall for a right breast asymmetry and 2
possible left breast masses.

EXAM:
DIGITAL DIAGNOSTIC BILATERAL MAMMOGRAM WITH CAD AND TOMO
ULTRASOUND LEFT BREAST

[L CC synth-2D]
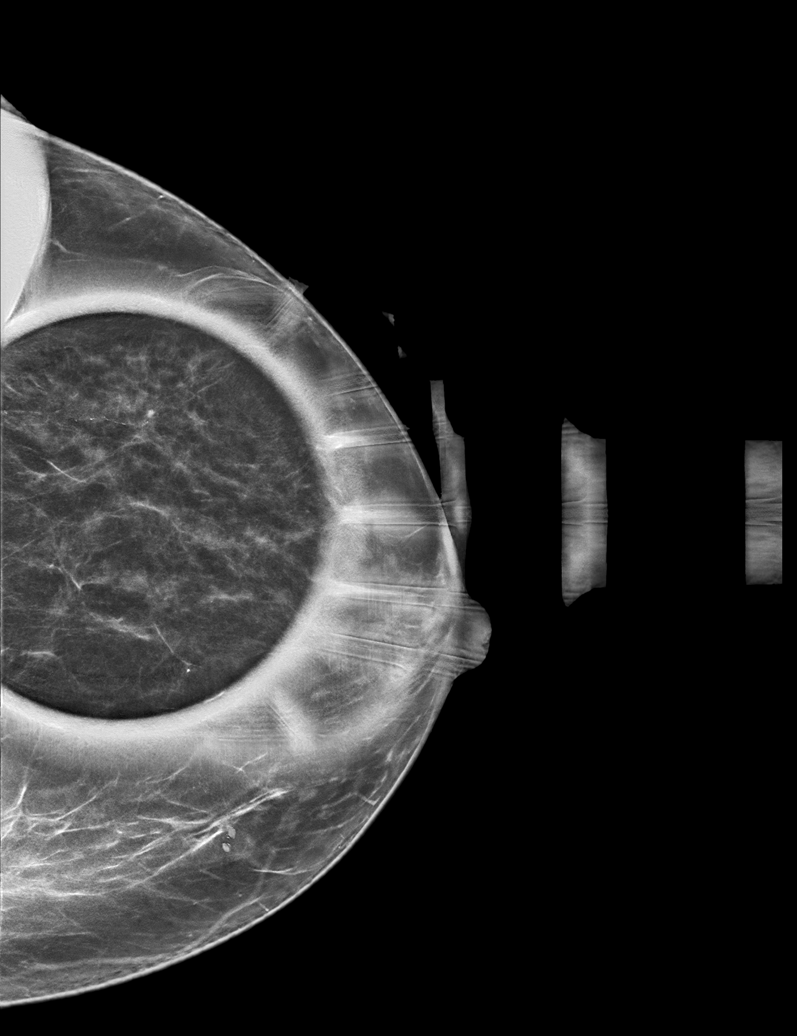

[R MLO synth-2D (1 of 3)]
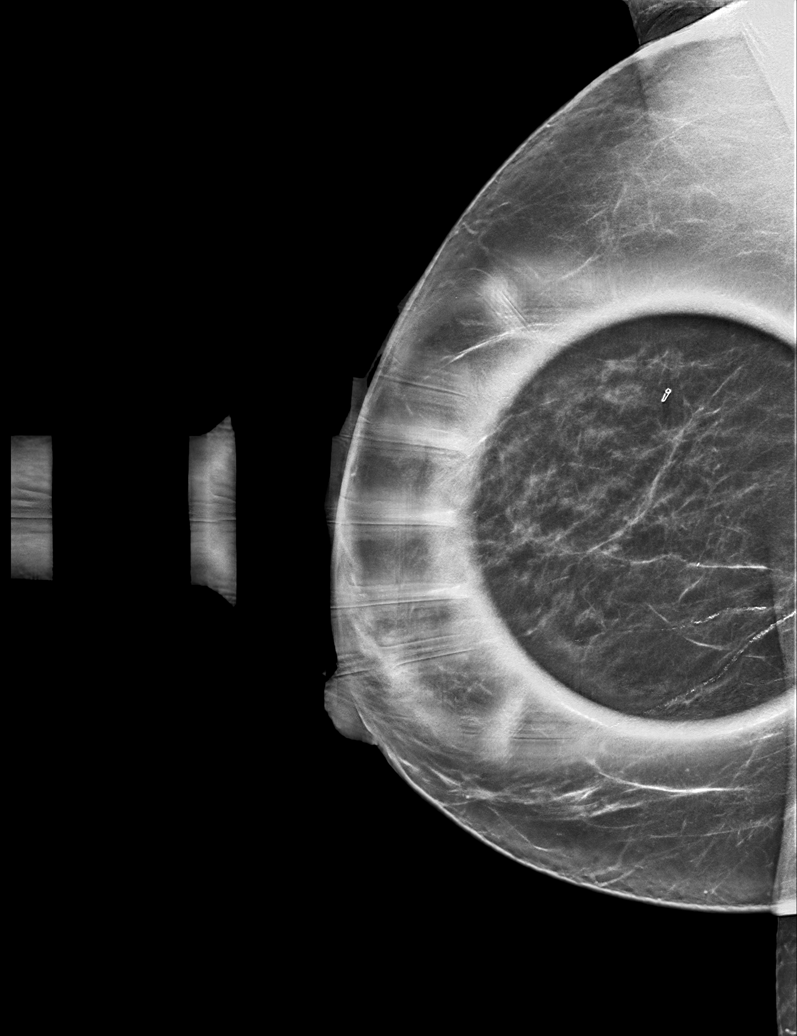

[R MLO synth-2D (2 of 3)]
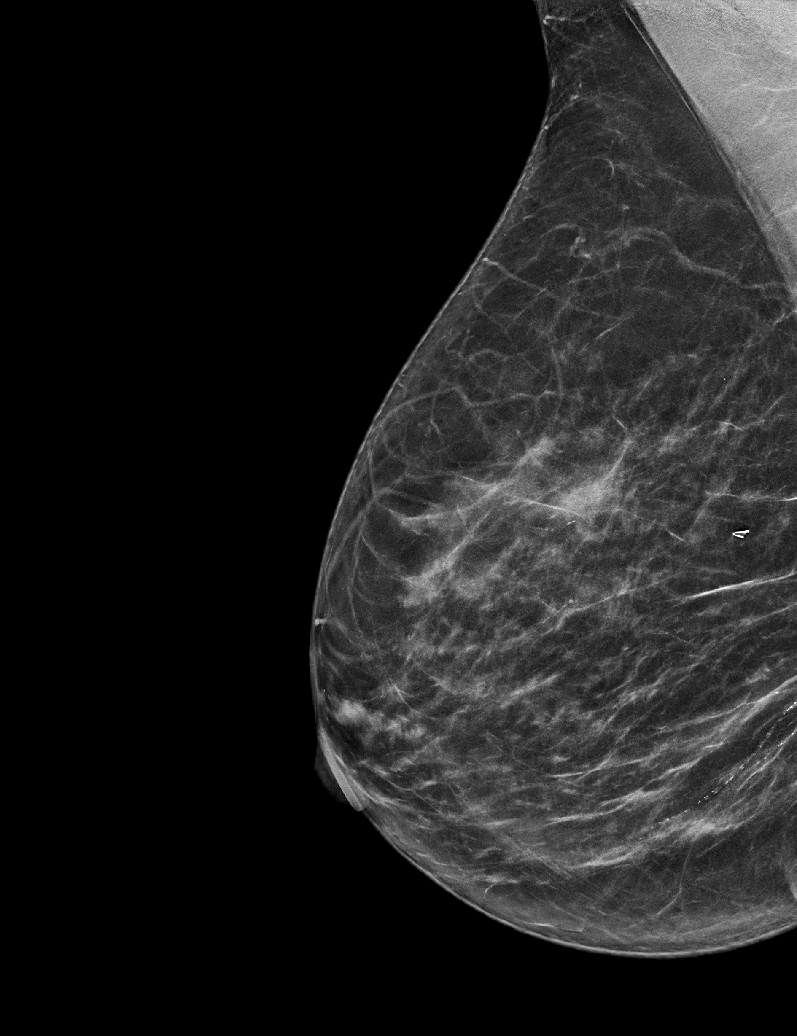

[R ML synth-2D]
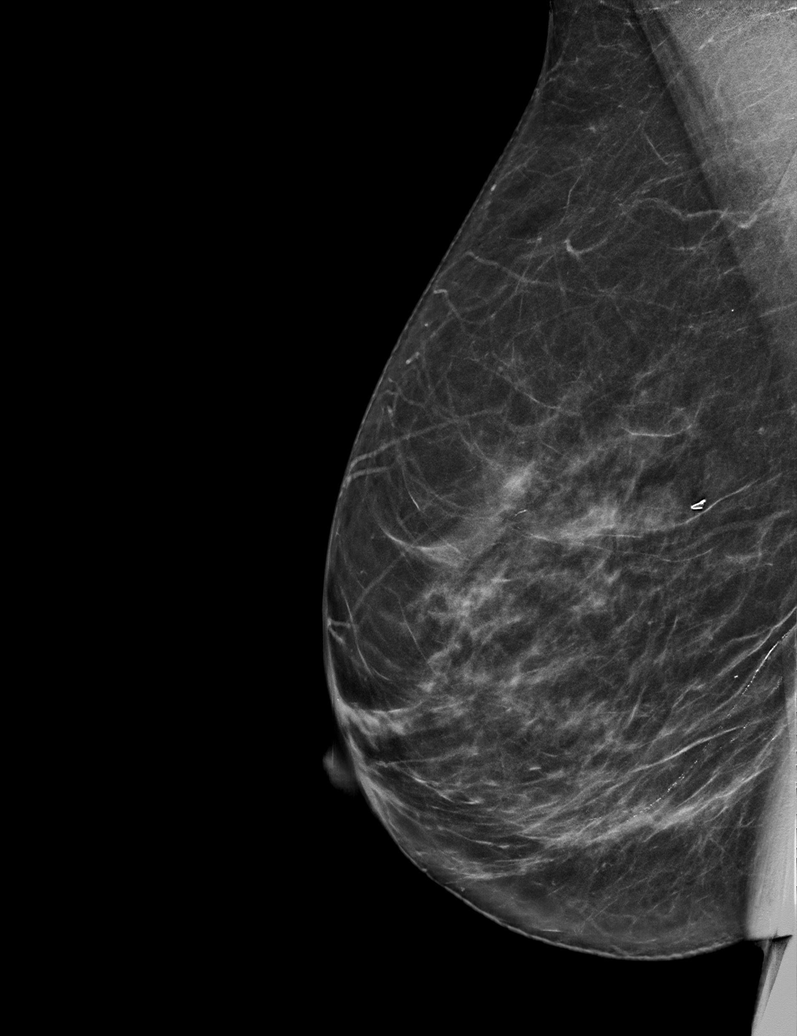

[R MLO synth-2D (3 of 3)]
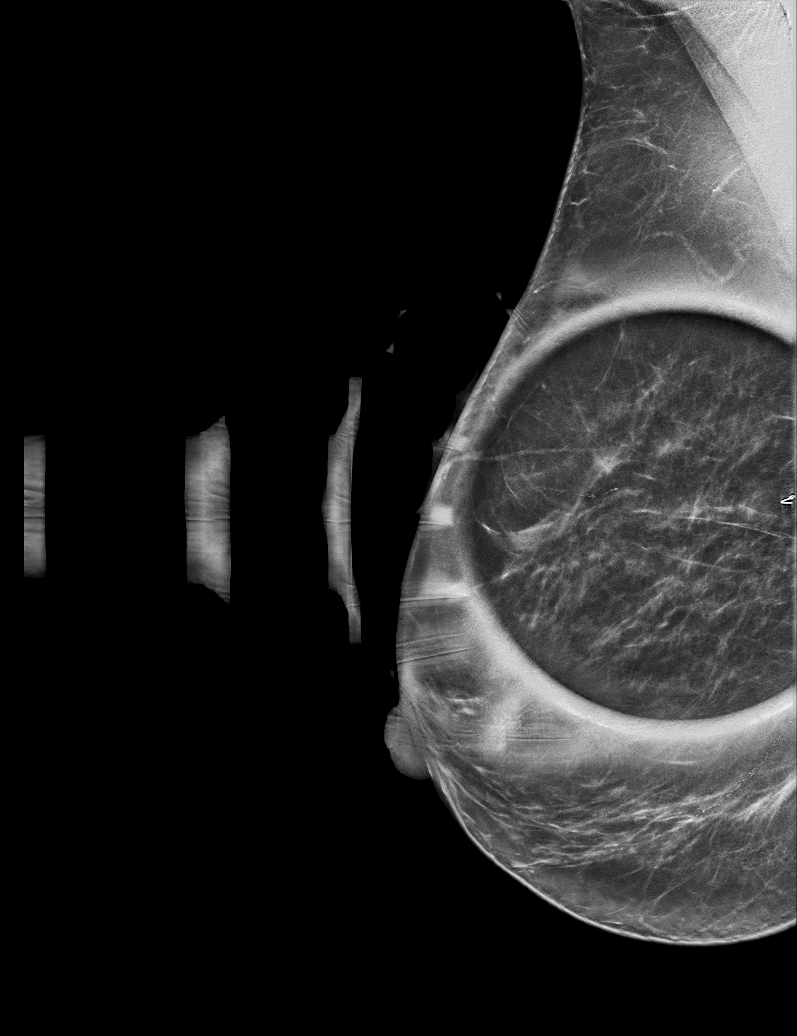

[L ML synth-2D]
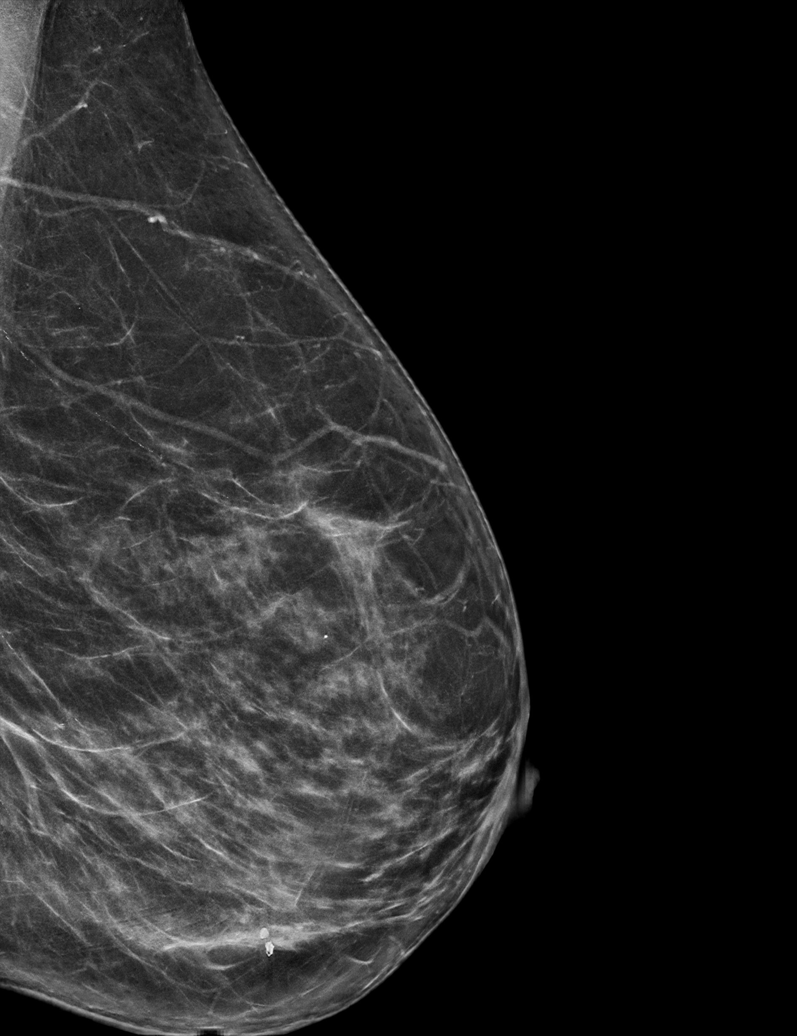

[6 of 36 positions shown; findings below may reference images not displayed]

ACR Breast Density Category c: The breast tissue is heterogeneously
dense, which may obscure small masses.
FINDINGS: Spot compression tomosynthesis images of the superior right breast
on the MLO view demonstrates resolution of the questioned asymmetry.
One of the masses in the left breast also effaces on spot
compression tomosynthesis imaging. The other persists and is a
low-density obscured mass measuring approximately 5 mm.

Mammographic images were processed with CAD.

Ultrasound of the left breast at 1 o'clock, 2 cm from the nipple
demonstrates a cluster of cysts, the largest of which measures 9 mm.
IMPRESSION: 1. The mass in the superior left breast corresponds with a benign
cluster of cysts.

2. The right breast asymmetry and the other mass in the left breast
resolved, consistent with overlapping fibroglandular tissue.

3.  No mammographic evidence of malignancy in the bilateral breasts.

RECOMMENDATION:
Screening mammogram in one year.(Code:KG-I-IF0)

I have discussed the findings and recommendations with the patient.
Results were also provided in writing at the conclusion of the
visit. If applicable, a reminder letter will be sent to the patient
regarding the next appointment.

BI-RADS CATEGORY  2: Benign.

## 2020-01-27 NOTE — Progress Notes (Signed)
   Covid-[facility] Vaccination Clinic  Name:  [name] [name]    MRN: [id number] DOB: [birth year]  01/27/2020  [name] [name] was observed post Covid-19 immunization for 15 minutes without incident. She was provided with Vaccine Information Sheet and instruction to access the V-Safe system.   [name] [name] was instructed to call 911 with any severe reactions post vaccine: Marland Kitchen Difficulty breathing  . Swelling of face and throat  . A fast heartbeat  . A bad rash all over body  . Dizziness and weakness

## 2020-02-06 ENCOUNTER — Ambulatory Visit (INDEPENDENT_AMBULATORY_CARE_PROVIDER_SITE_OTHER): Payer: Medicare HMO | Admitting: Internal Medicine

## 2020-02-06 ENCOUNTER — Encounter: Payer: Self-pay | Admitting: Internal Medicine

## 2020-02-06 ENCOUNTER — Other Ambulatory Visit: Payer: Self-pay

## 2020-02-06 ENCOUNTER — Other Ambulatory Visit: Payer: Self-pay | Admitting: Internal Medicine

## 2020-02-06 DIAGNOSIS — L409 Psoriasis, unspecified: Secondary | ICD-10-CM

## 2020-02-06 DIAGNOSIS — L932 Other local lupus erythematosus: Secondary | ICD-10-CM

## 2020-02-06 DIAGNOSIS — R69 Illness, unspecified: Secondary | ICD-10-CM | POA: Diagnosis not present

## 2020-02-06 DIAGNOSIS — F330 Major depressive disorder, recurrent, mild: Secondary | ICD-10-CM | POA: Diagnosis not present

## 2020-02-06 DIAGNOSIS — N951 Menopausal and female climacteric states: Secondary | ICD-10-CM

## 2020-02-06 DIAGNOSIS — R110 Nausea: Secondary | ICD-10-CM | POA: Diagnosis not present

## 2020-02-06 MED ORDER — FLURANDRENOLIDE 0.05 % EX LOTN: TOPICAL_LOTION | CUTANEOUS | 3 refills | Status: DC

## 2020-02-06 MED ORDER — ESTRADIOL 0.0375 MG/24HR TD PTTW: 1.0000 | MEDICATED_PATCH | TRANSDERMAL | 12 refills | Status: DC

## 2020-02-06 NOTE — Progress Notes (Signed)
[facility] [place], [place] [address]  Internal MEDICINE  Office Visit Note  Patient Name: [name] [name]  [id number]  [id number]  Date of Service: 02/06/2020  Chief Complaint  Patient presents with  . Follow-up    refill request  . Depression  . Hypertension  . policy update form    received    HPI  Pt is here for follow up. She was seen for acute visit for ongoing nausea, she was instructed to stop all OTC medications. GI referral was made as well. She is feeling much better after stopping OTC supplements. Cymbalta is working well as well. She is concerned abut hot flashes and will like to go on HRT. Pt has a dx of discoid lupus and has lost hair, will like to have refill on her topical steroids    She continues to walk on a regular basis.  Current Medication: Outpatient Encounter Medications as of 02/06/2020  Medication Sig  . Ascorbic Acid (VITAMIN C PO) Take 1,000 mg by mouth daily.  Marland Kitchen aspirin EC 81 MG tablet Take 81 mg by mouth daily.  . Cholecalciferol (VITAMIN D3 PO) Take 10,000 Units by mouth daily.  . Clobetasol Propionate 0.05 % shampoo Use as directed twice weekly prn  . conjugated estrogens (PREMARIN) vaginal cream Use 1 applicatorful vaginally 2 times weekly  . desloratadine (CLARINEX) 5 MG tablet Take 1 tablet (5 mg total) by mouth daily.  . DULoxetine (CYMBALTA) 30 MG capsule Take 1 capsule (30 mg total) by mouth daily.  Derrill Memo ON 02/08/2020] estradiol (VIVELLE-DOT) 0.0375 MG/24HR Place 1 patch onto the skin 2 (two) times a week.  Derrill Memo ON 02/08/2020] flurandrenolide (CORDRAN) 0.05 % lotion Apply topically 2 (two) times a week. Use as directed  . fluticasone (FLONASE) 50 MCG/ACT nasal spray Place 2 sprays into both nostrils as needed.  . gabapentin (NEURONTIN) 300 MG capsule Take 1 capsule (300 mg total) by mouth at bedtime.  . mirabegron ER (MYRBETRIQ) 50 MG TB24 tablet Take 1 tablet (50 mg total) by mouth daily.  . pantoprazole  (PROTONIX) 40 MG tablet Take 1 tablet (40 mg total) by mouth daily.  Marland Kitchen VITAMIN E PO Take 450 mg by mouth daily.  . [DISCONTINUED] BIOTIN PO Take 81 mg by mouth 2 (two) times daily.  . [DISCONTINUED] estrogen, conjugated,-medroxyprogesterone (PREMPRO) 0.625-2.5 MG tablet Take 1 tablet by mouth daily. (Patient not taking: Reported on 12/21/2019)  . [DISCONTINUED] flurandrenolide (CORDRAN) 0.05 % lotion Apply topically 2 (two) times a week. Use as directed  . [DISCONTINUED] Multiple Vitamins-Minerals (CENTRUM SILVER 50+WOMEN PO) Take by mouth. (Patient not taking: Reported on 12/21/2019)   No facility-administered encounter medications on file as of 02/06/2020.    Surgical History: Past Surgical History:  Procedure Laterality Date  . BREAST BIOPSY Right 2016   cor bx   BENIGN BREAST LOBULES AND FIBROADIPOSE TISSUE   . ruptured disc  1992    Medical History: Past Medical History:  Diagnosis Date  . Allergic rhinitis   . Depression   . GERD (gastroesophageal reflux disease)   . Hypertension   . Postmenopausal disorder     Family History: Family History  Problem Relation Age of Onset  . Asthma Mother   . Diabetes Mother   . Hypertension Mother   . Stroke Mother   . Epilepsy Mother   . Coronary artery disease Mother   . Breast cancer Neg Hx     Social History   Socioeconomic History  .  Marital status: Married    Spouse name: Not on file  . Number of children: Not on file  . Years of education: Not on file  . Highest education level: Not on file  Occupational History  . Not on file  Tobacco Use  . Smoking status: Never Smoker  . Smokeless tobacco: Never Used  Vaping Use  . Vaping Use: Never used  Substance and Sexual Activity  . Alcohol use: Yes    Comment: holidays, very rarely  . Drug use: Never  . Sexual activity: Not on file  Other Topics Concern  . Not on file  Social History Narrative  . Not on file   Social Determinants of Health   Financial Resource  Strain:   . Difficulty of Paying Living Expenses: Not on file  Food Insecurity:   . Worried About Charity fundraiser in the Last Year: Not on file  . Ran Out of Food in the Last Year: Not on file  Transportation Needs:   . Lack of Transportation (Medical): Not on file  . Lack of Transportation (Non-Medical): Not on file  Physical Activity:   . Days of Exercise per Week: Not on file  . Minutes of Exercise per Session: Not on file  Stress:   . Feeling of Stress : Not on file  Social Connections:   . Frequency of Communication with Friends and Family: Not on file  . Frequency of Social Gatherings with Friends and Family: Not on file  . Attends Religious Services: Not on file  . Active Member of Clubs or Organizations: Not on file  . Attends Archivist Meetings: Not on file  . Marital Status: Not on file  Intimate Partner Violence:   . Fear of Current or Ex-Partner: Not on file  . Emotionally Abused: Not on file  . Physically Abused: Not on file  . Sexually Abused: Not on file      Review of Systems  Constitutional: Negative for chills, diaphoresis and fatigue.  HENT: Negative for ear pain, postnasal drip and sinus pressure.   Eyes: Negative for photophobia, discharge, redness, itching and visual disturbance.  Respiratory: Negative for cough, shortness of breath and wheezing.   Cardiovascular: Negative for chest pain, palpitations and leg swelling.  Gastrointestinal: Negative for abdominal pain, constipation, diarrhea, nausea and vomiting.  Endocrine: Positive for heat intolerance.  Genitourinary: Negative for dysuria and flank pain.  Musculoskeletal: Negative for arthralgias, back pain, gait problem and neck pain.  Skin: Negative for color change.  Allergic/Immunologic: Negative for environmental allergies and food allergies.  Neurological: Negative for dizziness and headaches.  Hematological: Does not bruise/bleed easily.  Psychiatric/Behavioral: Negative for  agitation, behavioral problems (depression) and hallucinations.    Vital Signs: BP (!) 148/86   Pulse 94   Temp (!) 97.4 F (36.3 C)   Resp 16   Ht 5\' 5"  (1.651 m)   Wt 146 lb 9.6 oz (66.5 kg)   LMP 08/09/2014   SpO2 99%   BMI 24.40 kg/m    Physical Exam Constitutional:      General: She is not in acute distress.    Appearance: She is well-developed. She is not diaphoretic.  HENT:     Head: Normocephalic and atraumatic.     Mouth/Throat:     Pharynx: No oropharyngeal exudate.  Eyes:     Pupils: Pupils are equal, round, and reactive to light.  Neck:     Thyroid: No thyromegaly.     Vascular: No JVD.  Trachea: No tracheal deviation.  Cardiovascular:     Rate and Rhythm: Normal rate and regular rhythm.     Heart sounds: Normal heart sounds. No murmur heard.  No friction rub. No gallop.   Pulmonary:     Effort: Pulmonary effort is normal. No respiratory distress.     Breath sounds: No wheezing or rales.  Chest:     Chest wall: No tenderness.  Abdominal:     General: Bowel sounds are normal.     Palpations: Abdomen is soft.  Musculoskeletal:        General: Normal range of motion.     Cervical back: Normal range of motion and neck supple.  Lymphadenopathy:     Cervical: No cervical adenopathy.  Skin:    General: Skin is warm and dry.     Comments: thinning of hair  Neurological:     Mental Status: She is alert and oriented to person, place, and time.     Cranial Nerves: No cranial nerve deficit.  Psychiatric:        Behavior: Behavior normal.        Thought Content: Thought content normal.        Judgment: Judgment normal.    Assessment/Plan: 1. Menopausal and female climacteric states Will start low dose topical estrogen, she has Prempro at home but high dose, was instructed can take half tab po qod for now, will later on once finished will add low dose progesterone ( 50 mg ) for warren drugs  - estradiol (VIVELLE-DOT) 0.0375 MG/24HR; Place 1 patch onto  the skin 2 (two) times a week.  Dispense: 8 patch; Refill: 12 long term side effects including breast cancer is discussed   2. Other local lupus erythematosus Remote h/o discoid lupus by biopsy, will prescribe with caution  - flurandrenolide (CORDRAN) 0.05 % lotion; Apply topically 2 (two) times a week. Use as directed  Dispense: 120 mL; Refill: 3  3. Nausea Resolved after stopping all otc supplements   4. Mild episode of recurrent major depressive disorder (HCC) Continue Cymbalta 30 mg po qd   General Counseling: Na verbalizes understanding of the findings of todays visit and agrees with plan of treatment. I have discussed any further diagnostic evaluation that may be needed or ordered today. We also reviewed her medications today. she has been encouraged to call the office with any questions or concerns that should arise related to todays visit.  Meds ordered this encounter  Medications  . estradiol (VIVELLE-DOT) 0.0375 MG/24HR    Sig: Place 1 patch onto the skin 2 (two) times a week.    Dispense:  8 patch    Refill:  12  . flurandrenolide (CORDRAN) 0.05 % lotion    Sig: Apply topically 2 (two) times a week. Use as directed    Dispense:  120 mL    Refill:  3    Total time spent: 35Minutes Time spent includes review of chart, medications, test results, and follow up plan with the patient.      Dr Lavera Guise Internal medicine

## 2020-02-28 ENCOUNTER — Other Ambulatory Visit: Payer: Self-pay

## 2020-02-28 DIAGNOSIS — J309 Allergic rhinitis, unspecified: Secondary | ICD-10-CM

## 2020-02-28 MED ORDER — FLUTICASONE PROPIONATE 50 MCG/ACT NA SUSP: 2.0000 | NASAL | 2 refills | Status: DC | PRN

## 2020-03-04 DIAGNOSIS — M79641 Pain in right hand: Secondary | ICD-10-CM | POA: Insufficient documentation

## 2020-03-04 DIAGNOSIS — M65311 Trigger thumb, right thumb: Secondary | ICD-10-CM | POA: Diagnosis not present

## 2020-03-04 DIAGNOSIS — M65331 Trigger finger, right middle finger: Secondary | ICD-10-CM | POA: Diagnosis not present

## 2020-03-04 DIAGNOSIS — M6530 Trigger finger, unspecified finger: Secondary | ICD-10-CM | POA: Insufficient documentation

## 2020-03-06 ENCOUNTER — Encounter: Payer: Self-pay | Admitting: Gastroenterology

## 2020-03-06 ENCOUNTER — Ambulatory Visit: Payer: Medicare HMO | Admitting: Gastroenterology

## 2020-03-12 ENCOUNTER — Encounter: Payer: Self-pay | Admitting: Nurse Practitioner

## 2020-03-12 ENCOUNTER — Ambulatory Visit (INDEPENDENT_AMBULATORY_CARE_PROVIDER_SITE_OTHER): Payer: Medicare HMO | Admitting: Nurse Practitioner

## 2020-03-12 ENCOUNTER — Other Ambulatory Visit: Payer: Self-pay

## 2020-03-12 VITALS — BP 124/75 | HR 76 | Temp 97.1°F | Resp 16 | Ht 65.0 in | Wt 148.6 lb

## 2020-03-12 DIAGNOSIS — J0140 Acute pansinusitis, unspecified: Secondary | ICD-10-CM

## 2020-03-12 DIAGNOSIS — K1230 Oral mucositis (ulcerative), unspecified: Secondary | ICD-10-CM | POA: Diagnosis not present

## 2020-03-12 MED ORDER — CHLORHEXIDINE GLUCONATE 0.12 % MT SOLN: OROMUCOSAL | 0 refills | Status: DC

## 2020-03-12 MED ORDER — SULFAMETHOXAZOLE-TRIMETHOPRIM 400-80 MG PO TABS: 1.0000 | ORAL_TABLET | Freq: Two times a day (BID) | ORAL | 0 refills | Status: DC

## 2020-03-12 NOTE — Progress Notes (Signed)
[facility] [place], [place] [address]  Internal MEDICINE  Office Visit Note  Patient Name: [name] [name]  [id number]  [id number]  Date of Service: 04/15/2020   Pt is here for a sick visit.  Chief Complaint  Patient presents with  . Acute Visit    Lip pain, cut lip, right cheek, right side of chin, pt fell on cement   . Gastroesophageal Reflux  . Depression  . Hypertension     The patient is here for acute visit. She fell the day before yesterday, hitting the right side of her face. She has abrasion to the right check, adjacent to the nose. She also has cut on inside of her mouth which bled. Upper right lip swollen from trauma. Has been applying cool compresses to the area. Is concerned about infection in the mouth and scarring to the face. When she fell, she did not lose consciousness.  Has sinus pressure and pain behind her eyes for the last week or two. Has taken OTC mucinex which has not helped. She does feel a little congested. Feels sleepy.        Current Medication:  Outpatient Encounter Medications as of 03/12/2020  Medication Sig  . aspirin EC 81 MG tablet Take 81 mg by mouth daily.  . Clobetasol Propionate 0.05 % shampoo Use as directed twice weekly prn  . conjugated estrogens (PREMARIN) vaginal cream Use 1 applicatorful vaginally 2 times weekly  . DULoxetine (CYMBALTA) 30 MG capsule Take 1 capsule (30 mg total) by mouth daily.  Marland Kitchen estradiol (VIVELLE-DOT) 0.0375 MG/24HR Place 1 patch onto the skin 2 (two) times a week.  . fluticasone (FLONASE) 50 MCG/ACT nasal spray Place 2 sprays into both nostrils as needed.  . gabapentin (NEURONTIN) 300 MG capsule Take 1 capsule (300 mg total) by mouth at bedtime.  . mirabegron ER (MYRBETRIQ) 50 MG TB24 tablet Take 1 tablet (50 mg total) by mouth daily.  . pantoprazole (PROTONIX) 40 MG tablet Take 1 tablet (40 mg total) by mouth daily.  Marland Kitchen triamcinolone lotion (KENALOG) 0.1 % Apply topically 2 (two)  times daily.  Marland Kitchen VITAMIN E PO Take 450 mg by mouth daily.  . [DISCONTINUED] desloratadine (CLARINEX) 5 MG tablet Take 1 tablet (5 mg total) by mouth daily.  . Ascorbic Acid (VITAMIN C PO) Take 1,000 mg by mouth daily. (Patient not taking: No sig reported)  . chlorhexidine (PERIDEX) 0.12 % solution Swish and spit with 59mls BID prn (Patient not taking: Reported on 03/28/2020)  . Cholecalciferol (VITAMIN D3 PO) Take 10,000 Units by mouth daily. (Patient not taking: No sig reported)  . [DISCONTINUED] sulfamethoxazole-trimethoprim (BACTRIM) 400-80 MG tablet Take 1 tablet by mouth 2 (two) times daily.   No facility-administered encounter medications on file as of 03/12/2020.      Medical History: Past Medical History:  Diagnosis Date  . Allergic rhinitis   . Depression   . GERD (gastroesophageal reflux disease)   . Hypertension   . Postmenopausal disorder      Today's Vitals   03/12/20 1548  BP: 124/75  Pulse: 76  Resp: 16  Temp: (!) 97.1 F (36.2 C)  SpO2: 100%  Weight: 148 lb 9.6 oz (67.4 kg)  Height: 5\' 5"  (1.651 m)   Body mass index is 24.73 kg/m.  Review of Systems  Constitutional: Negative for activity change, chills, fatigue and unexpected weight change.  HENT: Positive for congestion, postnasal drip, rhinorrhea, sinus pressure and sinus pain. Negative for sneezing and sore  throat.   Respiratory: Negative for cough, chest tightness, shortness of breath and wheezing.   Cardiovascular: Negative for chest pain and palpitations.  Gastrointestinal: Negative for abdominal pain, constipation, diarrhea, nausea and vomiting.  Endocrine: Negative for cold intolerance, heat intolerance, polydipsia and polyuria.  Musculoskeletal: Negative for arthralgias, back pain, joint swelling and neck pain.  Skin: Negative for rash.  Allergic/Immunologic: Positive for environmental allergies.  Neurological: Positive for headaches. Negative for tremors and numbness.  Hematological: Negative  for adenopathy. Does not bruise/bleed easily.  Psychiatric/Behavioral: Negative for behavioral problems (Depression), sleep disturbance and suicidal ideas. The patient is not nervous/anxious.     Physical Exam Vitals and nursing note reviewed.  Constitutional:      General: She is not in acute distress.    Appearance: Normal appearance. She is well-developed and well-nourished. She is not diaphoretic.  HENT:     Head: Normocephalic and atraumatic.     Right Ear: Tympanic membrane is bulging.     Left Ear: Tympanic membrane is bulging.     Nose: Congestion present.     Right Sinus: Frontal sinus tenderness present.     Left Sinus: Frontal sinus tenderness present.     Mouth/Throat:     Mouth: Oropharynx is clear and moist.     Pharynx: No oropharyngeal exudate.     Comments: Red lesion on inner aspect of right cheek. Has lesion on right lip which is slightly swollen and appears to be healing.  Eyes:     Extraocular Movements: EOM normal.     Pupils: Pupils are equal, round, and reactive to light.  Neck:     Thyroid: No thyromegaly.     Vascular: No JVD.     Trachea: No tracheal deviation.  Cardiovascular:     Rate and Rhythm: Normal rate and regular rhythm.     Heart sounds: Normal heart sounds. No murmur heard. No friction rub. No gallop.   Pulmonary:     Effort: Pulmonary effort is normal. No respiratory distress.     Breath sounds: Normal breath sounds. No wheezing or rales.  Chest:     Chest wall: No tenderness.  Abdominal:     Palpations: Abdomen is soft.  Musculoskeletal:        General: Normal range of motion.     Cervical back: Normal range of motion and neck supple.  Lymphadenopathy:     Cervical: No cervical adenopathy.  Skin:    General: Skin is warm and dry.  Neurological:     General: No focal deficit present.     Mental Status: She is alert and oriented to person, place, and time.     Cranial Nerves: No cranial nerve deficit.  Psychiatric:        Mood  and Affect: Mood and affect and mood normal.        Behavior: Behavior normal.        Thought Content: Thought content normal.        Judgment: Judgment normal.    Assessment/Plan: 1. Acute non-recurrent pansinusitis Start bactrim 400/80mg  twice daily. Recommend she take OTC medication as needed and as indicated to help acute symptoms.   2. Oral mucositis Swish and spit with 77mls twice daily  - chlorhexidine (PERIDEX) 0.12 % solution; Swish and spit with 61mls BID prn (Patient not taking: Reported on 03/28/2020)  Dispense: 400 mL; Refill: 0  General Counseling: Catherin verbalizes understanding of the findings of todays visit and agrees with plan of treatment. I have discussed  any further diagnostic evaluation that may be needed or ordered today. We also reviewed her medications today. she has been encouraged to call the office with any questions or concerns that should arise related to todays visit.    Counseling:   This patient was seen by [place] with Dr Lavera Guise as a part of collaborative care agreement  Meds ordered this encounter  Medications  . chlorhexidine (PERIDEX) 0.12 % solution    Sig: Swish and spit with 28mls BID prn    Dispense:  400 mL    Refill:  0    Order Specific Question:   Supervising Provider    Answer:   Lavera Guise [place]  . DISCONTD: sulfamethoxazole-trimethoprim (BACTRIM) 400-80 MG tablet    Sig: Take 1 tablet by mouth 2 (two) times daily.    Dispense:  20 tablet    Refill:  0    Order Specific Question:   Supervising Provider    Answer:   Lavera Guise [[id number]]     Time spent: 25 Minutes

## 2020-03-14 DIAGNOSIS — G3184 Mild cognitive impairment, so stated: Secondary | ICD-10-CM | POA: Diagnosis not present

## 2020-03-14 DIAGNOSIS — R0683 Snoring: Secondary | ICD-10-CM | POA: Diagnosis not present

## 2020-03-15 ENCOUNTER — Ambulatory Visit: Payer: Medicare HMO

## 2020-03-15 DIAGNOSIS — E010 Iodine-deficiency related diffuse (endemic) goiter: Secondary | ICD-10-CM | POA: Diagnosis not present

## 2020-03-18 DIAGNOSIS — R69 Illness, unspecified: Secondary | ICD-10-CM | POA: Diagnosis not present

## 2020-03-28 ENCOUNTER — Ambulatory Visit (INDEPENDENT_AMBULATORY_CARE_PROVIDER_SITE_OTHER): Payer: Medicare HMO | Admitting: Nurse Practitioner

## 2020-03-28 ENCOUNTER — Encounter: Payer: Self-pay | Admitting: Nurse Practitioner

## 2020-03-28 VITALS — BP 127/79 | HR 83 | Temp 97.5°F | Resp 16 | Ht 65.0 in | Wt 146.4 lb

## 2020-03-28 DIAGNOSIS — J0111 Acute recurrent frontal sinusitis: Secondary | ICD-10-CM

## 2020-03-28 DIAGNOSIS — F321 Major depressive disorder, single episode, moderate: Secondary | ICD-10-CM | POA: Diagnosis not present

## 2020-03-28 DIAGNOSIS — R69 Illness, unspecified: Secondary | ICD-10-CM | POA: Diagnosis not present

## 2020-03-28 MED ORDER — DOXYCYCLINE HYCLATE 100 MG PO TABS: 100.0000 mg | ORAL_TABLET | Freq: Two times a day (BID) | ORAL | 0 refills | Status: DC

## 2020-03-28 NOTE — Progress Notes (Signed)
[facility] [place], [place] [address]  Internal MEDICINE  Office Visit Note  Patient Name: [name] [name]  [id number]  [id number]  Date of Service: 04/18/2020   Pt is here for a sick visit.  Chief Complaint  Patient presents with  . Acute Visit    Hurts around temples     The patient presents for acute visit.  -sinus pressure, especially behind the eyes.  -scratchy throat -cough -denies fever -denies nausea, vomiting -was treated a few weeks ago with bactrim for sinusitis. Completed antibiotics. Did get better initially.  -new symptoms started yesterday or the day before.        Current Medication:  Outpatient Encounter Medications as of 03/28/2020  Medication Sig  . aspirin EC 81 MG tablet Take 81 mg by mouth daily.  . Clobetasol Propionate 0.05 % shampoo Use as directed twice weekly prn  . conjugated estrogens (PREMARIN) vaginal cream Use 1 applicatorful vaginally 2 times weekly  . doxycycline (VIBRA-TABS) 100 MG tablet Take 1 tablet (100 mg total) by mouth 2 (two) times daily.  . DULoxetine (CYMBALTA) 30 MG capsule Take 1 capsule (30 mg total) by mouth daily.  Marland Kitchen estradiol (VIVELLE-DOT) 0.0375 MG/24HR Place 1 patch onto the skin 2 (two) times a week.  . fluticasone (FLONASE) 50 MCG/ACT nasal spray Place 2 sprays into both nostrils as needed.  . gabapentin (NEURONTIN) 300 MG capsule Take 1 capsule (300 mg total) by mouth at bedtime.  . mirabegron ER (MYRBETRIQ) 50 MG TB24 tablet Take 1 tablet (50 mg total) by mouth daily.  . pantoprazole (PROTONIX) 40 MG tablet Take 1 tablet (40 mg total) by mouth daily.  Marland Kitchen triamcinolone lotion (KENALOG) 0.1 % Apply topically 2 (two) times daily.  Marland Kitchen VITAMIN E PO Take 450 mg by mouth daily.  . [DISCONTINUED] desloratadine (CLARINEX) 5 MG tablet Take 1 tablet (5 mg total) by mouth daily.  . [DISCONTINUED] sulfamethoxazole-trimethoprim (BACTRIM) 400-80 MG tablet Take 1 tablet by mouth 2 (two) times daily.   . Ascorbic Acid (VITAMIN C PO) Take 1,000 mg by mouth daily. (Patient not taking: No sig reported)  . chlorhexidine (PERIDEX) 0.12 % solution Swish and spit with 80mls BID prn (Patient not taking: Reported on 03/28/2020)  . Cholecalciferol (VITAMIN D3 PO) Take 10,000 Units by mouth daily. (Patient not taking: No sig reported)   No facility-administered encounter medications on file as of 03/28/2020.      Medical History: Past Medical History:  Diagnosis Date  . Allergic rhinitis   . Depression   . GERD (gastroesophageal reflux disease)   . Hypertension   . Postmenopausal disorder     Today's Vitals   03/28/20 1339  BP: 127/79  Pulse: 83  Resp: 16  Temp: (!) 97.5 F (36.4 C)  SpO2: 98%  Weight: 146 lb 6.4 oz (66.4 kg)  Height: 5\' 5"  (1.651 m)   Body mass index is 24.36 kg/m.  Review of Systems  Constitutional: Positive for fatigue. Negative for chills, fever and unexpected weight change.  HENT: Positive for ear pain, postnasal drip, sinus pressure, sinus pain and sore throat. Negative for congestion, rhinorrhea, sneezing and voice change.   Respiratory: Negative for cough, chest tightness, shortness of breath and wheezing.   Cardiovascular: Negative for chest pain and palpitations.  Gastrointestinal: Negative for abdominal pain, constipation, diarrhea, nausea and vomiting.  Musculoskeletal: Negative for arthralgias, back pain, joint swelling and neck pain.  Skin: Negative for rash.  Allergic/Immunologic: Positive for environmental allergies.  Neurological:  Positive for headaches. Negative for tremors and numbness.  Hematological: Negative for adenopathy. Does not bruise/bleed easily.  Psychiatric/Behavioral: Negative for behavioral problems (Depression), sleep disturbance and suicidal ideas. The patient is nervous/anxious.        Ongoing and well managed anxiety.     Physical Exam Vitals and nursing note reviewed.  Constitutional:      General: She is not in acute  distress.    Appearance: Normal appearance. She is well-developed and well-nourished. She is not diaphoretic.  HENT:     Head: Normocephalic and atraumatic.     Right Ear: Tympanic membrane is erythematous and bulging.     Left Ear: Tympanic membrane is erythematous and bulging.     Nose: Congestion present.     Right Sinus: Frontal sinus tenderness present.     Left Sinus: Frontal sinus tenderness present.     Mouth/Throat:     Mouth: Oropharynx is clear and moist.     Pharynx: No oropharyngeal exudate.  Eyes:     Extraocular Movements: EOM normal.     Pupils: Pupils are equal, round, and reactive to light.  Neck:     Thyroid: No thyromegaly.     Vascular: No JVD.     Trachea: No tracheal deviation.  Cardiovascular:     Rate and Rhythm: Normal rate and regular rhythm.     Heart sounds: Normal heart sounds. No murmur heard. No friction rub. No gallop.   Pulmonary:     Effort: Pulmonary effort is normal. No respiratory distress.     Breath sounds: Normal breath sounds. No wheezing or rales.  Chest:     Chest wall: No tenderness.  Abdominal:     Palpations: Abdomen is soft.  Musculoskeletal:        General: Normal range of motion.     Cervical back: Normal range of motion and neck supple.  Lymphadenopathy:     Cervical: Cervical adenopathy present.  Skin:    General: Skin is warm and dry.  Neurological:     General: No focal deficit present.     Mental Status: She is alert and oriented to person, place, and time.     Cranial Nerves: No cranial nerve deficit.  Psychiatric:        Mood and Affect: Mood and affect and mood normal.        Behavior: Behavior normal.        Thought Content: Thought content normal.        Judgment: Judgment normal.    Assessment/Plan: 1. Acute recurrent frontal sinusitis Will do round of doxycycline 100mg  twice daily for ten days. Consider gett CT of sinuses of no improvement after treatment.  - doxycycline (VIBRA-TABS) 100 MG tablet; Take 1  tablet (100 mg total) by mouth 2 (two) times daily.  Dispense: 20 tablet; Refill: 0  2. Depression, major, single episode, moderate ([place]) Doing well on cymbalta. Continue as prescribed   General Counseling: stephaine sandidge understanding of the findings of todays visit and agrees with plan of treatment. I have discussed any further diagnostic evaluation that may be needed or ordered today. We also reviewed her medications today. she has been encouraged to call the office with any questions or concerns that should arise related to todays visit.    Counseling:   This patient was seen by [place] with Dr Lavera Guise as a part of collaborative care agreement  Meds ordered this encounter  Medications  . doxycycline (VIBRA-TABS) 100  MG tablet    Sig: Take 1 tablet (100 mg total) by mouth 2 (two) times daily.    Dispense:  20 tablet    Refill:  0    Order Specific Question:   Supervising Provider    Answer:   Lyndon Code [[id number]]    Time spent: 25 Minutes

## 2020-04-01 ENCOUNTER — Ambulatory Visit: Payer: Self-pay | Admitting: Nurse Practitioner

## 2020-04-11 ENCOUNTER — Other Ambulatory Visit: Payer: Self-pay

## 2020-04-11 ENCOUNTER — Telehealth: Payer: Self-pay

## 2020-04-11 DIAGNOSIS — J309 Allergic rhinitis, unspecified: Secondary | ICD-10-CM

## 2020-04-11 MED ORDER — DESLORATADINE 5 MG PO TABS: 5.0000 mg | ORAL_TABLET | Freq: Every day | ORAL | 3 refills | Status: DC

## 2020-04-12 ENCOUNTER — Ambulatory Visit
Admission: RE | Admit: 2020-04-12 | Discharge: 2020-04-12 | Disposition: A | Discharge status: Home / Self Care | Payer: Medicare HMO | Attending: Internal Medicine | Admitting: Internal Medicine

## 2020-04-12 ENCOUNTER — Encounter: Payer: Self-pay | Admitting: Internal Medicine

## 2020-04-12 ENCOUNTER — Other Ambulatory Visit: Payer: Self-pay | Admitting: Nurse Practitioner

## 2020-04-12 ENCOUNTER — Other Ambulatory Visit: Payer: Self-pay

## 2020-04-12 ENCOUNTER — Ambulatory Visit
Admission: RE | Admit: 2020-04-12 | Discharge: 2020-04-12 | Disposition: A | Discharge status: Home / Self Care | Payer: Medicare HMO | Source: Ambulatory Visit | Attending: Internal Medicine | Admitting: Internal Medicine

## 2020-04-12 ENCOUNTER — Ambulatory Visit (INDEPENDENT_AMBULATORY_CARE_PROVIDER_SITE_OTHER): Payer: Medicare HMO | Admitting: Internal Medicine

## 2020-04-12 ENCOUNTER — Other Ambulatory Visit: Payer: Self-pay | Admitting: Internal Medicine

## 2020-04-12 VITALS — BP 147/83 | HR 89 | Temp 97.5°F | Resp 16 | Ht 65.0 in | Wt 147.0 lb

## 2020-04-12 DIAGNOSIS — M25551 Pain in right hip: Secondary | ICD-10-CM

## 2020-04-12 DIAGNOSIS — F5101 Primary insomnia: Secondary | ICD-10-CM

## 2020-04-12 DIAGNOSIS — R030 Elevated blood-pressure reading, without diagnosis of hypertension: Secondary | ICD-10-CM

## 2020-04-12 MED ORDER — MELOXICAM 15 MG PO TABS: 15.0000 mg | ORAL_TABLET | Freq: Every day | ORAL | 0 refills | Status: DC

## 2020-04-12 MED ORDER — CYCLOBENZAPRINE HCL 10 MG PO TABS: 10.0000 mg | ORAL_TABLET | Freq: Every day | ORAL | 0 refills | Status: DC

## 2020-04-12 MED ORDER — ZOLPIDEM TARTRATE 10 MG PO TABS: ORAL_TABLET | ORAL | 1 refills | Status: DC

## 2020-04-12 NOTE — Telephone Encounter (Signed)
Check this out  °

## 2020-04-12 NOTE — Telephone Encounter (Signed)
I sent ambien 10mg  - take 1/2 to 1 tablet po at bedtime as needed. Use with caution. Be ready to go to bed after taking it.

## 2020-04-12 NOTE — Progress Notes (Signed)
[facility] [place], [place] [address]  Internal MEDICINE  Office Visit Note  Patient Name: [name] [name]  [id number]  [id number]  Date of Service: 04/16/2020  Chief Complaint  Patient presents with  . Acute Visit  . Hip Pain    Right hip pain     HPI Pt is here for a sick visit. Right hip pain starting yesterday and right knee moving down starting 2 weeks go. Pain with walking. Tried advil last night, but came back this AM. Has not taken anything this AM for pain. Denies numbness and tingling. Has previously had sciatica and this feels different. No changes with incontinence. Has had BM and no trouble urinating.  Sitting does not trigger pain. Did not disrupt sleep. Normally sleeps on back.  Has had previous kidney infection and is worried this is similar, but denies flank pain, dysuria, increased frequency, or urgency.   Current Medication:  Outpatient Encounter Medications as of 04/12/2020  Medication Sig  . cyclobenzaprine (FLEXERIL) 10 MG tablet Take 1 tablet (10 mg total) by mouth at bedtime.  . meloxicam (MOBIC) 15 MG tablet Take 1 tablet (15 mg total) by mouth daily.  . Ascorbic Acid (VITAMIN C PO) Take 1,000 mg by mouth daily. (Patient not taking: No sig reported)  . aspirin EC 81 MG tablet Take 81 mg by mouth daily.  . chlorhexidine (PERIDEX) 0.12 % solution Swish and spit with 22mls BID prn (Patient not taking: Reported on 03/28/2020)  . Cholecalciferol (VITAMIN D3 PO) Take 10,000 Units by mouth daily. (Patient not taking: No sig reported)  . Clobetasol Propionate 0.05 % shampoo Use as directed twice weekly prn  . conjugated estrogens (PREMARIN) vaginal cream Use 1 applicatorful vaginally 2 times weekly  . desloratadine (CLARINEX) 5 MG tablet Take 1 tablet (5 mg total) by mouth daily.  Marland Kitchen doxycycline (VIBRA-TABS) 100 MG tablet Take 1 tablet (100 mg total) by mouth 2 (two) times daily.  . DULoxetine (CYMBALTA) 30 MG capsule Take 1 capsule (30  mg total) by mouth daily.  Marland Kitchen estradiol (VIVELLE-DOT) 0.0375 MG/24HR Place 1 patch onto the skin 2 (two) times a week.  . fluticasone (FLONASE) 50 MCG/ACT nasal spray Place 2 sprays into both nostrils as needed.  . gabapentin (NEURONTIN) 300 MG capsule Take 1 capsule (300 mg total) by mouth at bedtime.  . mirabegron ER (MYRBETRIQ) 50 MG TB24 tablet Take 1 tablet (50 mg total) by mouth daily.  . pantoprazole (PROTONIX) 40 MG tablet Take 1 tablet (40 mg total) by mouth daily.  Marland Kitchen triamcinolone lotion (KENALOG) 0.1 % Apply topically 2 (two) times daily.  Marland Kitchen VITAMIN E PO Take 450 mg by mouth daily.   No facility-administered encounter medications on file as of 04/12/2020.      Medical History: Past Medical History:  Diagnosis Date  . Allergic rhinitis   . Depression   . GERD (gastroesophageal reflux disease)   . Hypertension   . Postmenopausal disorder      Vital Signs: BP (!) 147/83   Pulse 89   Temp (!) 97.5 F (36.4 C)   Resp 16   Ht 5\' 5"  (1.651 m)   Wt 147 lb (66.7 kg)   LMP 08/09/2014   SpO2 99%   BMI 24.46 kg/m    Review of Systems  Constitutional: Negative for appetite change, fatigue and fever.  HENT: Negative for congestion, mouth sores and postnasal drip.   Respiratory: Negative for cough and shortness of breath.   Cardiovascular:  Negative for chest pain.  Gastrointestinal: Negative for abdominal pain, blood in stool, diarrhea, nausea and vomiting.  Genitourinary: Negative for difficulty urinating, dysuria and enuresis.  Musculoskeletal: Positive for arthralgias.       Pain in right hip/buttock Also R leg pain from knee down with deep pressure No bruising or swelling  Neurological: Negative for numbness.  Psychiatric/Behavioral: Negative.     Physical Exam Constitutional:      General: She is not in acute distress.    Appearance: She is well-developed and well-nourished. She is not diaphoretic.  HENT:     Head: Normocephalic and atraumatic.      Mouth/Throat:     Mouth: Oropharynx is clear and moist.     Pharynx: No oropharyngeal exudate.  Eyes:     Extraocular Movements: EOM normal.     Pupils: Pupils are equal, round, and reactive to light.  Neck:     Thyroid: No thyromegaly.     Vascular: No JVD.     Trachea: No tracheal deviation.  Cardiovascular:     Rate and Rhythm: Normal rate and regular rhythm.     Heart sounds: Normal heart sounds. No murmur heard. No friction rub. No gallop.   Pulmonary:     Effort: Pulmonary effort is normal. No respiratory distress.     Breath sounds: No wheezing or rales.  Chest:     Chest wall: No tenderness.  Abdominal:     General: Bowel sounds are normal.     Palpations: Abdomen is soft.  Musculoskeletal:        General: No tenderness.     Cervical back: Normal range of motion and neck supple.     Comments: Pain on internal and external rotation of R hip, L hip ROM nl Non TTP  Lymphadenopathy:     Cervical: No cervical adenopathy.  Skin:    General: Skin is warm and dry.  Neurological:     Mental Status: She is alert and oriented to person, place, and time.     Cranial Nerves: No cranial nerve deficit.  Psychiatric:        Mood and Affect: Mood and affect normal.        Behavior: Behavior normal.        Thought Content: Thought content normal.        Judgment: Judgment normal.       Assessment/Plan: 1. Acute right hip pain Take the following medications for pain relief and obtain R hip XR. Pt already scheduled for f/u in 2 weeks and will call if she needs to return sooner. - meloxicam (MOBIC) 15 MG tablet; Take 1 tablet (15 mg total) by mouth daily.  Dispense: 30 tablet; Refill: 0 - cyclobenzaprine (FLEXERIL) 10 MG tablet; Take 1 tablet (10 mg total) by mouth at bedtime.  Dispense: 30 tablet; Refill: 0 - DG Hip Unilat W OR W/O Pelvis 2-3 Views Right; Future 2. Elevated Blood Pressure Likely due to current pain, however will reassess at next appointment.  General  Counseling: lindyn vossler understanding of the findings of todays visit and agrees with plan of treatment. I have discussed any further diagnostic evaluation that may be needed or ordered today. We also reviewed her medications today. she has been encouraged to call the office with any questions or concerns that should arise related to todays visit.    Orders Placed This Encounter  Procedures  . DG Hip Unilat W OR W/O Pelvis 2-3 Views Right    Meds ordered this  encounter  Medications  . meloxicam (MOBIC) 15 MG tablet    Sig: Take 1 tablet (15 mg total) by mouth daily.    Dispense:  30 tablet    Refill:  0  . cyclobenzaprine (FLEXERIL) 10 MG tablet    Sig: Take 1 tablet (10 mg total) by mouth at bedtime.    Dispense:  30 tablet    Refill:  0    Time spent: 20 Minutes

## 2020-04-15 DIAGNOSIS — K1230 Oral mucositis (ulcerative), unspecified: Secondary | ICD-10-CM | POA: Insufficient documentation

## 2020-04-15 DIAGNOSIS — J0140 Acute pansinusitis, unspecified: Secondary | ICD-10-CM | POA: Insufficient documentation

## 2020-04-18 DIAGNOSIS — J0111 Acute recurrent frontal sinusitis: Secondary | ICD-10-CM | POA: Insufficient documentation

## 2020-04-18 NOTE — Telephone Encounter (Signed)
Working on [place], was denied but need to send appeal

## 2020-04-26 NOTE — Telephone Encounter (Signed)
Informed pt that medication wasn't covered on insurance.  Advised to use Goodrx to purchase medication

## 2020-04-29 ENCOUNTER — Other Ambulatory Visit: Payer: Self-pay

## 2020-04-29 DIAGNOSIS — K219 Gastro-esophageal reflux disease without esophagitis: Secondary | ICD-10-CM

## 2020-04-29 MED ORDER — PANTOPRAZOLE SODIUM 40 MG PO TBEC: 40.0000 mg | DELAYED_RELEASE_TABLET | Freq: Every day | ORAL | 1 refills | Status: DC

## 2020-04-30 ENCOUNTER — Ambulatory Visit (INDEPENDENT_AMBULATORY_CARE_PROVIDER_SITE_OTHER): Payer: Medicare HMO | Admitting: Internal Medicine

## 2020-04-30 ENCOUNTER — Encounter: Payer: Self-pay | Admitting: Internal Medicine

## 2020-04-30 VITALS — BP 135/78 | HR 91 | Temp 97.9°F | Resp 16 | Ht 64.0 in | Wt 150.6 lb

## 2020-04-30 DIAGNOSIS — N951 Menopausal and female climacteric states: Secondary | ICD-10-CM

## 2020-04-30 DIAGNOSIS — E041 Nontoxic single thyroid nodule: Secondary | ICD-10-CM

## 2020-04-30 DIAGNOSIS — F5101 Primary insomnia: Secondary | ICD-10-CM | POA: Diagnosis not present

## 2020-04-30 DIAGNOSIS — R69 Illness, unspecified: Secondary | ICD-10-CM | POA: Diagnosis not present

## 2020-04-30 NOTE — Progress Notes (Signed)
[facility] [place], [place] [address]  Internal MEDICINE  Office Visit Note  Patient Name: [name] [name]  [id number]  [id number]  Date of Service: 05/08/2020  Chief Complaint  Patient presents with  . medication follow up    Estradiol patch and prempro tablets    HPI  Pt is here for routine follow up.  Her hip pain is improved, Hip xray is normal. She has been doing stretching exercises. She was started on low dose HRT, estradiol patch, she is tapering down Prempro to off until next visit, feels well, hot flashes and insomnia has improved  Pt also has colloidal thyroid cyst which will need frequent monitoring   Current Medication: Outpatient Encounter Medications as of 04/30/2020  Medication Sig  . Ascorbic Acid (VITAMIN C PO) Take 1,000 mg by mouth daily.  Marland Kitchen aspirin EC 81 MG tablet Take 81 mg by mouth daily.  . chlorhexidine (PERIDEX) 0.12 % solution Swish and spit with 53mls BID prn  . Cholecalciferol (VITAMIN D3 PO) Take 10,000 Units by mouth daily.  . Clobetasol Propionate 0.05 % shampoo Use as directed twice weekly prn  . conjugated estrogens (PREMARIN) vaginal cream Use 1 applicatorful vaginally 2 times weekly  . desloratadine (CLARINEX) 5 MG tablet Take 1 tablet (5 mg total) by mouth daily.  . DULoxetine (CYMBALTA) 30 MG capsule Take 1 capsule (30 mg total) by mouth daily.  Marland Kitchen estradiol (VIVELLE-DOT) 0.0375 MG/24HR Place 1 patch onto the skin 2 (two) times a week.  . fluticasone (FLONASE) 50 MCG/ACT nasal spray Place 2 sprays into both nostrils as needed.  . gabapentin (NEURONTIN) 300 MG capsule Take 1 capsule (300 mg total) by mouth at bedtime.  . meloxicam (MOBIC) 15 MG tablet Take 1 tablet (15 mg total) by mouth daily.  . pantoprazole (PROTONIX) 40 MG tablet Take 1 tablet (40 mg total) by mouth daily.  Marland Kitchen triamcinolone lotion (KENALOG) 0.1 % Apply topically 2 (two) times daily.  Marland Kitchen VITAMIN E PO Take 450 mg by mouth daily.  Marland Kitchen zolpidem (AMBIEN)  10 MG tablet Take 1/2 to 1 tablet po QHS prn insomnia  . [DISCONTINUED] cyclobenzaprine (FLEXERIL) 10 MG tablet TAKE 1 TABLET BY MOUTH EVERYDAY AT BEDTIME  . [DISCONTINUED] mirabegron ER (MYRBETRIQ) 50 MG TB24 tablet Take 1 tablet (50 mg total) by mouth daily.  . [DISCONTINUED] doxycycline (VIBRA-TABS) 100 MG tablet Take 1 tablet (100 mg total) by mouth 2 (two) times daily. (Patient not taking: Reported on 04/30/2020)   No facility-administered encounter medications on file as of 04/30/2020.    Surgical History: Past Surgical History:  Procedure Laterality Date  . BREAST BIOPSY Right 2016   cor bx   BENIGN BREAST LOBULES AND FIBROADIPOSE TISSUE   . ruptured disc  1992    Medical History: Past Medical History:  Diagnosis Date  . Allergic rhinitis   . Depression   . GERD (gastroesophageal reflux disease)   . Hypertension   . Postmenopausal disorder     Family History: Family History  Problem Relation Age of Onset  . Asthma Mother   . Diabetes Mother   . Hypertension Mother   . Stroke Mother   . Epilepsy Mother   . Coronary artery disease Mother   . Breast cancer Neg Hx     Social History   Socioeconomic History  . Marital status: Married    Spouse name: Not on file  . Number of children: Not on file  . Years of education: Not on  file  . Highest education level: Not on file  Occupational History  . Not on file  Tobacco Use  . Smoking status: Never Smoker  . Smokeless tobacco: Never Used  Vaping Use  . Vaping Use: Never used  Substance and Sexual Activity  . Alcohol use: Yes    Comment: holidays, very rarely  . Drug use: Never  . Sexual activity: Not on file  Other Topics Concern  . Not on file  Social History Narrative  . Not on file   Social Determinants of Health   Financial Resource Strain: Not on file  Food Insecurity: Not on file  Transportation Needs: Not on file  Physical Activity: Not on file  Stress: Not on file  Social Connections: Not on file   Intimate Partner Violence: Not on file      Review of Systems  Constitutional: Negative for chills, fatigue and unexpected weight change.  HENT: Positive for postnasal drip. Negative for congestion, rhinorrhea, sneezing and sore throat.   Eyes: Negative for redness.  Respiratory: Negative for cough, chest tightness and shortness of breath.   Cardiovascular: Negative for chest pain and palpitations.  Gastrointestinal: Positive for constipation. Negative for abdominal pain, diarrhea, nausea and vomiting.  Genitourinary: Negative for dysuria and frequency.  Musculoskeletal: Negative for arthralgias, back pain and neck pain.  Skin: Negative for rash.  Neurological: Negative.  Negative for tremors and numbness.  Psychiatric/Behavioral: Negative for sleep disturbance and suicidal ideas. Behavioral problem: Depression. The patient is not nervous/anxious.     Vital Signs: BP 135/78 Comment: 142/80  Pulse 91   Temp 97.9 F (36.6 C)   Resp 16   Ht 5\' 4"  (1.626 m)   Wt 150 lb 9.6 oz (68.3 kg)   LMP 08/09/2014   SpO2 98%   BMI 25.85 kg/m    Physical Exam Constitutional:      General: She is not in acute distress.    Appearance: She is well-developed. She is not diaphoretic.  HENT:     Head: Normocephalic and atraumatic.     Mouth/Throat:     Pharynx: No oropharyngeal exudate.  Eyes:     Pupils: Pupils are equal, round, and reactive to light.  Neck:     Thyroid: No thyromegaly.     Vascular: No JVD.     Trachea: No tracheal deviation.  Cardiovascular:     Rate and Rhythm: Normal rate and regular rhythm.     Heart sounds: Normal heart sounds. No murmur heard. No friction rub. No gallop.   Pulmonary:     Effort: Pulmonary effort is normal. No respiratory distress.     Breath sounds: No wheezing or rales.  Chest:     Chest wall: No tenderness.  Abdominal:     General: Bowel sounds are normal.     Palpations: Abdomen is soft.  Musculoskeletal:        General: Normal range  of motion.     Cervical back: Normal range of motion and neck supple.  Lymphadenopathy:     Cervical: No cervical adenopathy.  Skin:    General: Skin is warm and dry.  Neurological:     Mental Status: She is alert and oriented to person, place, and time.     Cranial Nerves: No cranial nerve deficit.  Psychiatric:        Behavior: Behavior normal.        Thought Content: Thought content normal.        Judgment: Judgment normal.  Assessment/Plan: 1. Menopausal and female climacteric states Continue on estradiol patch and continue to taper prempro to off, might need low dose progesteron capsule later on ( 50 mg po qod)   2. Primary insomnia Improved on ambien   3. Benign thyroid cyst Will monitor q 1-2 year   General Counseling: Darely verbalizes understanding of the findings of todays visit and agrees with plan of treatment. I have discussed any further diagnostic evaluation that may be needed or ordered today. We also reviewed her medications today. she has been encouraged to call the office with any questions or concerns that should arise related to todays visit.   Total time spent: 30Minutes Time spent includes review of chart, medications, test results, and follow up plan with the patient.   [place] Controlled Substance Database was reviewed by me.   Dr Lavera Guise Internal medicine

## 2020-05-02 ENCOUNTER — Telehealth: Payer: Self-pay

## 2020-05-02 DIAGNOSIS — M25551 Pain in right hip: Secondary | ICD-10-CM

## 2020-05-02 MED ORDER — CYCLOBENZAPRINE HCL 10 MG PO TABS: ORAL_TABLET | ORAL | 3 refills | Status: DC

## 2020-05-02 NOTE — Telephone Encounter (Signed)
PA for CYCLOBENZAPRINE 10 mg Tab was approved on 05/01/2020 for coverage from 03/30/2020 to 07/30/2020.  Sent new prescription to pharmacy and informed patient.

## 2020-05-02 NOTE — Telephone Encounter (Signed)
PA approved.

## 2020-05-06 ENCOUNTER — Other Ambulatory Visit: Payer: Self-pay

## 2020-05-06 MED ORDER — MIRABEGRON ER 50 MG PO TB24: 50.0000 mg | ORAL_TABLET | Freq: Every day | ORAL | 3 refills | Status: DC

## 2020-05-08 ENCOUNTER — Encounter: Payer: Self-pay | Admitting: Internal Medicine

## 2020-05-10 DIAGNOSIS — M25561 Pain in right knee: Secondary | ICD-10-CM | POA: Insufficient documentation

## 2020-05-10 DIAGNOSIS — M5459 Other low back pain: Secondary | ICD-10-CM | POA: Diagnosis not present

## 2020-05-12 DIAGNOSIS — G4733 Obstructive sleep apnea (adult) (pediatric): Secondary | ICD-10-CM | POA: Diagnosis not present

## 2020-05-13 DIAGNOSIS — M25561 Pain in right knee: Secondary | ICD-10-CM | POA: Diagnosis not present

## 2020-06-03 ENCOUNTER — Ambulatory Visit: Payer: Medicare HMO | Attending: Internal Medicine

## 2020-06-03 DIAGNOSIS — R0683 Snoring: Secondary | ICD-10-CM | POA: Diagnosis not present

## 2020-06-03 DIAGNOSIS — G4733 Obstructive sleep apnea (adult) (pediatric): Secondary | ICD-10-CM | POA: Diagnosis not present

## 2020-06-03 DIAGNOSIS — M25561 Pain in right knee: Secondary | ICD-10-CM | POA: Diagnosis not present

## 2020-06-04 ENCOUNTER — Other Ambulatory Visit: Payer: Self-pay

## 2020-06-08 DIAGNOSIS — G4730 Sleep apnea, unspecified: Secondary | ICD-10-CM | POA: Diagnosis not present

## 2020-06-10 ENCOUNTER — Ambulatory Visit: Payer: Medicare HMO | Admitting: Gastroenterology

## 2020-06-10 ENCOUNTER — Encounter: Payer: Self-pay | Admitting: Gastroenterology

## 2020-06-10 ENCOUNTER — Other Ambulatory Visit: Payer: Self-pay

## 2020-06-10 VITALS — BP 157/88 | HR 71 | Ht 64.0 in | Wt 149.6 lb

## 2020-06-10 DIAGNOSIS — M25561 Pain in right knee: Secondary | ICD-10-CM | POA: Diagnosis not present

## 2020-06-10 DIAGNOSIS — K219 Gastro-esophageal reflux disease without esophagitis: Secondary | ICD-10-CM

## 2020-06-10 DIAGNOSIS — Z1211 Encounter for screening for malignant neoplasm of colon: Secondary | ICD-10-CM

## 2020-06-10 NOTE — Patient Instructions (Signed)
Gastroesophageal Reflux Disease, Adult  Gastroesophageal reflux (GER) happens when acid from the stomach flows up into the tube that connects the mouth and the stomach (esophagus). Normally, food travels down the esophagus and stays in the stomach to be digested. With GER, food and stomach acid sometimes move back up into the esophagus. You may have a disease called gastroesophageal reflux disease (GERD) if the reflux:  Happens often.  Causes frequent or very bad symptoms.  Causes problems such as damage to the esophagus. When this happens, the esophagus becomes sore and swollen. Over time, GERD can make small holes (ulcers) in the lining of the esophagus. What are the causes? This condition is caused by a problem with the muscle between the esophagus and the stomach. When this muscle is weak or not normal, it does not close properly to keep food and acid from coming back up from the stomach. The muscle can be weak because of:  Tobacco use.  Pregnancy.  Having a certain type of hernia (hiatal hernia).  Alcohol use.  Certain foods and drinks, such as coffee, chocolate, onions, and peppermint. What increases the risk?  Being overweight.  Having a disease that affects your connective tissue.  Taking NSAIDs, such a ibuprofen. What are the signs or symptoms?  Heartburn.  Difficult or painful swallowing.  The feeling of having a lump in the throat.  A bitter taste in the mouth.  Bad breath.  Having a lot of saliva.  Having an upset or bloated stomach.  Burping.  Chest pain. Different conditions can cause chest pain. Make sure you see your doctor if you have chest pain.  Shortness of breath or wheezing.  A long-term cough or a cough at night.  Wearing away of the surface of teeth (tooth enamel).  Weight loss. How is this treated?  Making changes to your diet.  Taking medicine.  Having surgery. Treatment will depend on how bad your symptoms are. Follow these  instructions at home: Eating and drinking  Follow a diet as told by your doctor. You may need to avoid foods and drinks such as: ? Coffee and tea, with or without caffeine. ? Drinks that contain alcohol. ? Energy drinks and sports drinks. ? Bubbly (carbonated) drinks or sodas. ? Chocolate and cocoa. ? Peppermint and mint flavorings. ? Garlic and onions. ? Horseradish. ? Spicy and acidic foods. These include peppers, chili powder, curry powder, vinegar, hot sauces, and BBQ sauce. ? Citrus fruit juices and citrus fruits, such as oranges, lemons, and limes. ? Tomato-based foods. These include red sauce, chili, salsa, and pizza with red sauce. ? Fried and fatty foods. These include donuts, french fries, potato chips, and high-fat dressings. ? High-fat meats. These include hot dogs, rib eye steak, sausage, ham, and bacon. ? High-fat dairy items, such as whole milk, butter, and cream cheese.  Eat small meals often. Avoid eating large meals.  Avoid drinking large amounts of liquid with your meals.  Avoid eating meals during the 2-3 hours before bedtime.  Avoid lying down right after you eat.  Do not exercise right after you eat.   Lifestyle  Do not smoke or use any products that contain nicotine or tobacco. If you need help quitting, ask your doctor.  Try to lower your stress. If you need help doing this, ask your doctor.  If you are overweight, lose an amount of weight that is healthy for you. Ask your doctor about a safe weight loss goal.   General instructions  Pay attention to any changes in your symptoms.  Take over-the-counter and prescription medicines only as told by your doctor.  Do not take aspirin, ibuprofen, or other NSAIDs unless your doctor says it is okay.  Wear loose clothes. Do not wear anything tight around your waist.  Raise (elevate) the head of your bed about 6 inches (15 cm). You may need to use a wedge to do this.  Avoid bending over if this makes your  symptoms worse.  Keep all follow-up visits. Contact a doctor if:  You have new symptoms.  You lose weight and you do not know why.  You have trouble swallowing or it hurts to swallow.  You have wheezing or a cough that keeps happening.  You have a hoarse voice.  Your symptoms do not get better with treatment. Get help right away if:  You have sudden pain in your arms, neck, jaw, teeth, or back.  You suddenly feel sweaty, dizzy, or light-headed.  You have chest pain or shortness of breath.  You vomit and the vomit is green, yellow, or black, or it looks like blood or coffee grounds.  You faint.  Your poop (stool) is red, bloody, or black.  You cannot swallow, drink, or eat. These symptoms may represent a serious problem that is an emergency. Do not wait to see if the symptoms will go away. Get medical help right away. Call your local emergency services (911 in the [place] Do not drive yourself to the hospital. Summary  If a person has gastroesophageal reflux disease (GERD), food and stomach acid move back up into the esophagus and cause symptoms or problems such as damage to the esophagus.  Treatment will depend on how bad your symptoms are.  Follow a diet as told by your doctor.  Take all medicines only as told by your doctor. This information is not intended to replace advice given to you by your health care provider. Make sure you discuss any questions you have with your health care provider. Document Revised: 09/25/2019 Document Reviewed: 09/25/2019 Elsevier Patient Education  [employer].

## 2020-06-10 NOTE — Progress Notes (Signed)
Jonathon Bellows MD, MRCP(U.K) [address]  [place], [place] [address]  Main: [phone]  Fax: [phone]   Gastroenterology Consultation  Referring Provider:     Lavera Guise, MD Primary Care Physician:  Lavera Guise, MD Primary Gastroenterologist:  Dr. Jonathon Bellows  Reason for Consultation:     GERD and colon cancer screening        HPI:   [name] [name] is a 65 y.o. y/o female referred for consultation & management  by Dr. Humphrey Rolls, Timoteo Gaul, MD.    She has been referred for GERD and colon cancer screening  She recollects her last colonoscopy was 7 to 8 years back.  No polyps noted.  No family history of colon cancer or polyps.  No change in bowel habits, rectal bleeding, change in shape of her stool, unintentional weight loss.  She has a history of GERD for which she has been taking Protonix 40 mg once a day with resolution of all symptoms which were regurgitation prior to starting on a PPI.  She has not tried dropping the dose in the past.  No symptoms at this point of time.  Past Medical History:  Diagnosis Date  . Allergic rhinitis   . Depression   . GERD (gastroesophageal reflux disease)   . Hypertension   . Postmenopausal disorder     Past Surgical History:  Procedure Laterality Date  . BREAST BIOPSY Right 2016   cor bx   BENIGN BREAST LOBULES AND FIBROADIPOSE TISSUE   . ruptured disc  1992    Prior to Admission medications   Medication Sig Start Date End Date Taking? Authorizing Provider  Ascorbic Acid (VITAMIN C PO) Take 1,000 mg by mouth daily.    [provider]  aspirin EC 81 MG tablet Take 81 mg by mouth daily.    [provider]  chlorhexidine (PERIDEX) 0.12 % solution Swish and spit with 41mls BID prn 03/12/20   Ronnell Freshwater, NP  Cholecalciferol (VITAMIN D3 PO) Take 10,000 Units by mouth daily.    [provider]  Clobetasol Propionate 0.05 % shampoo Use as directed twice weekly prn 06/29/19   Ronnell Freshwater,  NP  conjugated estrogens (PREMARIN) vaginal cream Use 1 applicatorful vaginally 2 times weekly 05/17/17   Ronnell Freshwater, NP  cyclobenzaprine (FLEXERIL) 10 MG tablet TAKE 1 TABLET BY MOUTH EVERYDAY AT BEDTIME 05/02/20   Lavera Guise, MD  desloratadine (CLARINEX) 5 MG tablet Take 1 tablet (5 mg total) by mouth daily. 04/11/20   Ronnell Freshwater, NP  DULoxetine (CYMBALTA) 30 MG capsule Take 1 capsule (30 mg total) by mouth daily. 01/24/20   Lavera Guise, MD  estradiol (VIVELLE-DOT) 0.0375 MG/24HR Place 1 patch onto the skin 2 (two) times a week. 02/08/20   Lavera Guise, MD  fluticasone [facility]) 50 MCG/ACT nasal spray Place 2 sprays into both nostrils as needed. 02/28/20   Lavera Guise, MD  gabapentin (NEURONTIN) 300 MG capsule Take 1 capsule (300 mg total) by mouth at bedtime. 07/26/19   Ronnell Freshwater, NP  meloxicam (MOBIC) 15 MG tablet Take 1 tablet (15 mg total) by mouth daily. 04/12/20   Lavera Guise, MD  mirabegron ER (MYRBETRIQ) 50 MG TB24 tablet Take 1 tablet (50 mg total) by mouth daily. 05/06/20   Lavera Guise, MD  pantoprazole (PROTONIX) 40 MG tablet Take 1 tablet (40 mg total) by mouth daily. 04/29/20   Lavera Guise,  MD  triamcinolone lotion (KENALOG) 0.1 % Apply topically 2 (two) times daily. 02/06/20   Lavera Guise, MD  VITAMIN E PO Take 450 mg by mouth daily.    [provider]  zolpidem (AMBIEN) 10 MG tablet Take 1/2 to 1 tablet po QHS prn insomnia 04/12/20   Ronnell Freshwater, NP    Family History  Problem Relation Age of Onset  . Asthma Mother   . Diabetes Mother   . Hypertension Mother   . Stroke Mother   . Epilepsy Mother   . Coronary artery disease Mother   . Breast cancer Neg Hx      Social History   Tobacco Use  . Smoking status: Never Smoker  . Smokeless tobacco: Never Used  Vaping Use  . Vaping Use: Never used  Substance Use Topics  . Alcohol use: Yes    Comment: holidays, very rarely  . Drug use: Never    Allergies as of 06/10/2020 - Review  Complete 04/30/2020  Allergen Reaction Noted  . Amoxicillin Nausea Only 09/06/2012  . Azithromycin Nausea Only and Other (See Comments) 12/11/2013  . Ciprofloxacin Nausea Only 04/13/2017  . Dicyclomine Nausea Only and Nausea And Vomiting 09/06/2012  . Other Nausea Only 09/06/2012  . Oxybutynin Nausea Only and Nausea And Vomiting 09/06/2012  . Penicillins Nausea And Vomiting and Nausea Only 09/06/2012  . Phenazopyridine Nausea Only 11/08/2017    Review of Systems:    All systems reviewed and negative except where noted in HPI.   Physical Exam:  LMP 08/09/2014  Patient's last menstrual period was 08/09/2014. Psych:  Alert and cooperative. Normal mood and affect. General:   Alert,  Well-developed, well-nourished, pleasant and cooperative in NAD Head:  Normocephalic and atraumatic. Eyes:  Sclera clear, no icterus.   Conjunctiva pink. Ears:  Normal auditory acuity. Lungs:  Respirations even and unlabored.  Clear throughout to auscultation.   No wheezes, crackles, or rhonchi. No acute distress. Heart:  Regular rate and rhythm; no murmurs, clicks, rubs, or gallops. Abdomen:  Normal bowel sounds.  No bruits.  Soft, non-tender and non-distended without masses, hepatosplenomegaly or hernias noted.  No guarding or rebound tenderness.    Neurologic:  Alert and oriented x3;  grossly normal neurologically. Psych:  Alert and cooperative. Normal mood and affect.  Imaging Studies: SLEEP STUDY DOCUMENTS  Result Date: 06/05/2020 Ordered by an unspecified provider.   Assessment and Plan:   [name] [name] is a 65 y.o. y/o female has been referred for GERD and colon cancer screening  Plan 1.  GERD : Counseled on life style changes, suggest to use PPI first thing in the morning on empty stomach and eat 30 minutes after. Advised on the use of a wedge pillow at night , avoid meals for 2 hours prior to bed time. Discussed the risks and benefits of long term PPI use including but not limited to bone  loss, chronic kidney disease, infections , low magnesium . Aim to use at the lowest dose for the shortest period of time   I have suggested her to decrease the dose of Protonix from 40 mg once a day to 20 mg once a day.  If she does well then will consider changing to Pepcid 40 mg once a day at night   2.  Colon cancer screening screening colonoscopy: She states she has had it 7 to 8 years back with no polyps noted.  I explained that if it was less than 10 years that we  are not due yet for a procedure.  She is going to go home try and obtain the report and call my office to inform due date.  Based on the results we will discuss timing of the procedure.  We will also discuss EGD to screen for Barrett's if proceeding with a colonoscopy.   I have discussed alternative options, risks & benefits,  which include, but are not limited to, bleeding, infection, perforation,respiratory complication & drug reaction.  The patient agrees with this plan & written consent will be obtained.    Follow up in 8 weeks telephone visit  Dr Jonathon Bellows MD,MRCP(U.K)

## 2020-06-26 ENCOUNTER — Telehealth: Payer: Self-pay | Admitting: Internal Medicine

## 2020-06-26 NOTE — Progress Notes (Signed)
  Chronic Care Management   Outreach Note  06/26/2020 Name: [name] [name] MRN: [id number] DOB: 03-21-1956  Referred by: Lavera Guise, MD Reason for referral : No chief complaint on file.   An unsuccessful telephone outreach was attempted today. The patient was referred to the pharmacist for assistance with care management and care coordination.   Follow Up Plan:   [name] [employer] Scheduler

## 2020-06-27 ENCOUNTER — Other Ambulatory Visit: Payer: Self-pay | Admitting: Nurse Practitioner

## 2020-06-27 DIAGNOSIS — E010 Iodine-deficiency related diffuse (endemic) goiter: Secondary | ICD-10-CM | POA: Diagnosis not present

## 2020-06-27 DIAGNOSIS — R5383 Other fatigue: Secondary | ICD-10-CM | POA: Diagnosis not present

## 2020-06-28 ENCOUNTER — Telehealth: Payer: Self-pay | Admitting: Internal Medicine

## 2020-06-28 DIAGNOSIS — M1711 Unilateral primary osteoarthritis, right knee: Secondary | ICD-10-CM | POA: Diagnosis not present

## 2020-06-28 DIAGNOSIS — M25561 Pain in right knee: Secondary | ICD-10-CM | POA: Diagnosis not present

## 2020-06-28 LAB — T3: T3, Total: 111 ng/dL (ref 71–180)

## 2020-06-28 LAB — T4, FREE: Free T4: 0.96 ng/dL (ref 0.82–1.77)

## 2020-06-28 LAB — THYROGLOBULIN ANTIBODY: Thyroglobulin Antibody: <1 IU/mL (ref 0.0–0.9)

## 2020-06-28 LAB — THYROID PEROXIDASE ANTIBODY: Thyroperoxidase Ab SerPl-aCnc: 8 IU/mL (ref 0–34)

## 2020-06-28 LAB — TSH: TSH: 1.09 u[IU]/mL (ref 0.450–4.500)

## 2020-06-28 NOTE — Progress Notes (Signed)
  Chronic Care Management   Outreach Note  06/28/2020 Name: [name] [name] MRN: [id number] DOB: [birth year]  Referred by: Lavera Guise, MD Reason for referral : No chief complaint on file.   A second unsuccessful telephone outreach was attempted today. The patient was referred to pharmacist for assistance with care management and care coordination.  Follow Up Plan:   [name] [employer] Scheduler

## 2020-07-01 ENCOUNTER — Telehealth: Payer: Self-pay | Admitting: Internal Medicine

## 2020-07-01 NOTE — Progress Notes (Signed)
  Chronic Care Management   Note  07/01/2020 Name: WRIGLEY WINBORNE MRN: [id number] DOB: [birth year]  ZENITA KISTER is a 65 y.o. year old female who is a primary care patient of Lavera Guise, MD. I reached out to Emmaline Life by phone today in response to a referral sent by Ms. Connye Burkitt Fredericksen's PCP, Lavera Guise, MD.   Ms. Asche was given information about Chronic Care Management services today including:  1. CCM service includes personalized support from designated clinical staff supervised by her physician, including individualized plan of care and coordination with other care providers 2. 24/7 contact phone numbers for assistance for urgent and routine care needs. 3. Service will only be billed when office clinical staff spend 20 minutes or more in a month to coordinate care. 4. Only one practitioner may furnish and bill the service in a calendar month. 5. The patient may stop CCM services at any time (effective at the end of the month) by phone call to the office staff.   Patient agreed to services and verbal consent obtained.   Follow up plan:   [name] [employer] Scheduler

## 2020-07-15 DIAGNOSIS — M25561 Pain in right knee: Secondary | ICD-10-CM | POA: Diagnosis not present

## 2020-07-15 DIAGNOSIS — M1711 Unilateral primary osteoarthritis, right knee: Secondary | ICD-10-CM | POA: Diagnosis not present

## 2020-07-16 ENCOUNTER — Encounter: Payer: Self-pay | Admitting: Hospice and Palliative Medicine

## 2020-07-16 ENCOUNTER — Ambulatory Visit (INDEPENDENT_AMBULATORY_CARE_PROVIDER_SITE_OTHER): Payer: Medicare HMO | Admitting: Hospice and Palliative Medicine

## 2020-07-16 ENCOUNTER — Other Ambulatory Visit: Payer: Self-pay

## 2020-07-16 VITALS — BP 138/79 | HR 84 | Temp 97.8°F | Resp 16 | Ht 64.0 in | Wt 149.0 lb

## 2020-07-16 DIAGNOSIS — R319 Hematuria, unspecified: Secondary | ICD-10-CM | POA: Diagnosis not present

## 2020-07-16 DIAGNOSIS — B373 Candidiasis of vulva and vagina: Secondary | ICD-10-CM | POA: Diagnosis not present

## 2020-07-16 DIAGNOSIS — R300 Dysuria: Secondary | ICD-10-CM

## 2020-07-16 DIAGNOSIS — B3731 Acute candidiasis of vulva and vagina: Secondary | ICD-10-CM

## 2020-07-16 DIAGNOSIS — N390 Urinary tract infection, site not specified: Secondary | ICD-10-CM

## 2020-07-16 LAB — POCT URINALYSIS DIPSTICK
Bilirubin, UA: NEGATIVE
Glucose, UA: NEGATIVE
Ketones, UA: NEGATIVE
Nitrite, UA: NEGATIVE
Protein, UA: POSITIVE — AB
Spec Grav, UA: 1.01 (ref 1.010–1.025)
Urobilinogen, UA: 0.2 E.U./dL
pH, UA: 5 (ref 5.0–8.0)

## 2020-07-16 MED ORDER — NITROFURANTOIN MONOHYD MACRO 100 MG PO CAPS: 100.0000 mg | ORAL_CAPSULE | Freq: Two times a day (BID) | ORAL | 0 refills | Status: DC

## 2020-07-16 MED ORDER — FLUCONAZOLE 150 MG PO TABS: ORAL_TABLET | ORAL | 0 refills | Status: DC

## 2020-07-16 NOTE — Progress Notes (Signed)
[facility] [place], [place] [address]  Internal MEDICINE  Office Visit Note  Patient Name: [name] [name]  [id number]  [id number]  Date of Service: 07/23/2020  Chief Complaint  Patient presents with  . Urinary Tract Infection     HPI Pt is here for a sick visit. Complaining of vaginal irritation and itching with white and thick discharge--started first on Sunday and symptoms do not seem to be improving Denies vaginal odor No urinary symptoms at this time  Current Medication:  Outpatient Encounter Medications as of 07/16/2020  Medication Sig  . aspirin EC 81 MG tablet Take 81 mg by mouth daily.  . Cholecalciferol (VITAMIN D3 PO) Take 10,000 Units by mouth daily.  . Clobetasol Propionate 0.05 % shampoo Use as directed twice weekly prn  . conjugated estrogens (PREMARIN) vaginal cream Use 1 applicatorful vaginally 2 times weekly  . cyclobenzaprine (FLEXERIL) 10 MG tablet TAKE 1 TABLET BY MOUTH EVERYDAY AT BEDTIME  . DULoxetine (CYMBALTA) 30 MG capsule Take 1 capsule (30 mg total) by mouth daily.  Marland Kitchen estradiol (VIVELLE-DOT) 0.0375 MG/24HR Place 1 patch onto the skin 2 (two) times a week.  . fluconazole (DIFLUCAN) 150 MG tablet Take one tablet by mouth for one dose, may repeat dosing in 3 days for recurring symptoms, may repeat for two additional doses.  . fluticasone (FLONASE) 50 MCG/ACT nasal spray Place 2 sprays into both nostrils as needed.  . mirabegron ER (MYRBETRIQ) 50 MG TB24 tablet Take 1 tablet (50 mg total) by mouth daily.  . nitrofurantoin, macrocrystal-monohydrate, (MACROBID) 100 MG capsule Take 1 capsule (100 mg total) by mouth 2 (two) times daily.  . pantoprazole (PROTONIX) 40 MG tablet Take 1 tablet (40 mg total) by mouth daily.  Marland Kitchen triamcinolone lotion (KENALOG) 0.1 % Apply topically 2 (two) times daily.  Marland Kitchen VITAMIN E PO Take 450 mg by mouth daily.  . Ascorbic Acid (VITAMIN C PO) Take 1,000 mg by mouth daily.  . chlorhexidine (PERIDEX) 0.12  % solution Swish and spit with 68mls BID prn  . desloratadine (CLARINEX) 5 MG tablet Take 1 tablet (5 mg total) by mouth daily.  Marland Kitchen gabapentin (NEURONTIN) 300 MG capsule Take 1 capsule (300 mg total) by mouth at bedtime.  . meloxicam (MOBIC) 15 MG tablet Take 1 tablet (15 mg total) by mouth daily.  Marland Kitchen zolpidem (AMBIEN) 10 MG tablet Take 1/2 to 1 tablet po QHS prn insomnia   No facility-administered encounter medications on file as of 07/16/2020.      Medical History: Past Medical History:  Diagnosis Date  . Allergic rhinitis   . Depression   . GERD (gastroesophageal reflux disease)   . Hypertension   . Postmenopausal disorder      Vital Signs: BP 138/79   Pulse 84   Temp 97.8 F (36.6 C)   Resp 16   Ht 5\' 4"  (1.626 m)   Wt 149 lb (67.6 kg)   LMP 08/09/2014   SpO2 98%   BMI 25.58 kg/m    Review of Systems  Constitutional: Negative for chills, diaphoresis and fatigue.  HENT: Negative for ear pain, postnasal drip and sinus pressure.   Eyes: Negative for photophobia, discharge, redness, itching and visual disturbance.  Respiratory: Negative for cough, shortness of breath and wheezing.   Cardiovascular: Negative for chest pain, palpitations and leg swelling.  Gastrointestinal: Negative for abdominal pain, constipation, diarrhea, nausea and vomiting.  Genitourinary: Positive for vaginal discharge. Negative for dysuria and flank pain.  Vaginal itching and irritation  Musculoskeletal: Negative for arthralgias, back pain, gait problem and neck pain.  Skin: Negative for color change.  Allergic/Immunologic: Negative for environmental allergies and food allergies.  Neurological: Negative for dizziness and headaches.  Hematological: Does not bruise/bleed easily.  Psychiatric/Behavioral: Negative for agitation, behavioral problems (depression) and hallucinations.    Physical Exam Vitals reviewed.  Constitutional:      Appearance: Normal appearance. She is normal weight.   Cardiovascular:     Rate and Rhythm: Normal rate and regular rhythm.     Pulses: Normal pulses.     Heart sounds: Normal heart sounds.  Pulmonary:     Effort: Pulmonary effort is normal.     Breath sounds: Normal breath sounds.  Skin:    General: Skin is warm.  Neurological:     General: No focal deficit present.     Mental Status: She is alert and oriented to person, place, and time. Mental status is at baseline.  Psychiatric:        Mood and Affect: Mood normal.        Behavior: Behavior normal.        Thought Content: Thought content normal.        Judgment: Judgment normal.    Assessment/Plan: 1. Vaginal candidiasis Treat with fluconazole, advised to contact office if symptoms do not improve within 1 week - fluconazole (DIFLUCAN) 150 MG tablet; Take one tablet by mouth for one dose, may repeat dosing in 3 days for recurring symptoms, may repeat for two additional doses.  Dispense: 3 tablet; Refill: 0  2. Dysuria Will start treatment with Macrobid due to presence of leukocytes as well as blood in urine Await culture results and adjust therapy as indicated - POCT Urinalysis Dipstick - CULTURE, URINE COMPREHENSIVE - nitrofurantoin, macrocrystal-monohydrate, (MACROBID) 100 MG capsule; Take 1 capsule (100 mg total) by mouth 2 (two) times daily.  Dispense: 20 capsule; Refill: 0  General Counseling: [name] [name] verbalizes understanding of the findings of todays visit and agrees with plan of treatment. I have discussed any further diagnostic evaluation that may be needed or ordered today. We also reviewed her medications today. she has been encouraged to call the office with any questions or concerns that should arise related to todays visit.   Orders Placed This Encounter  Procedures  . CULTURE, URINE COMPREHENSIVE  . POCT Urinalysis Dipstick    Meds ordered this encounter  Medications  . nitrofurantoin, macrocrystal-monohydrate, (MACROBID) 100 MG capsule    Sig: Take 1 capsule  (100 mg total) by mouth 2 (two) times daily.    Dispense:  20 capsule    Refill:  0  . fluconazole (DIFLUCAN) 150 MG tablet    Sig: Take one tablet by mouth for one dose, may repeat dosing in 3 days for recurring symptoms, may repeat for two additional doses.    Dispense:  3 tablet    Refill:  0    Time spent: 25 Minutes Time spent includes review of chart, medications, test results and follow-up plan with the patient.  This patient was seen by Theodoro Grist AGNP-C in Collaboration with Dr Lavera Guise as a part of collaborative care agreement.  Tanna Furry [facility]

## 2020-07-20 LAB — CULTURE, URINE COMPREHENSIVE

## 2020-07-23 ENCOUNTER — Ambulatory Visit: Payer: Medicare HMO | Admitting: Internal Medicine

## 2020-07-23 ENCOUNTER — Encounter: Payer: Self-pay | Admitting: Hospice and Palliative Medicine

## 2020-07-24 DIAGNOSIS — G4733 Obstructive sleep apnea (adult) (pediatric): Secondary | ICD-10-CM | POA: Diagnosis not present

## 2020-07-24 DIAGNOSIS — G3184 Mild cognitive impairment, so stated: Secondary | ICD-10-CM | POA: Diagnosis not present

## 2020-07-24 DIAGNOSIS — R0683 Snoring: Secondary | ICD-10-CM | POA: Diagnosis not present

## 2020-07-25 ENCOUNTER — Telehealth: Payer: Self-pay

## 2020-07-25 NOTE — Telephone Encounter (Signed)
Pt advised that urine don't showed any growth of infection its mixed flora drink plenty of water

## 2020-07-26 ENCOUNTER — Other Ambulatory Visit: Payer: Self-pay | Admitting: Neurology

## 2020-07-26 ENCOUNTER — Other Ambulatory Visit (HOSPITAL_COMMUNITY): Payer: Self-pay | Admitting: Neurology

## 2020-07-26 DIAGNOSIS — G3184 Mild cognitive impairment, so stated: Secondary | ICD-10-CM

## 2020-07-30 ENCOUNTER — Telehealth: Payer: Self-pay | Admitting: Pharmacist

## 2020-07-30 NOTE — Progress Notes (Addendum)
Chronic Care Management Pharmacy Assistant   Name: [name] [name]  MRN: [id number] DOB: Mar 04, 1956  [name] [name] is an 65 y.o. year old female who presents for his initial CCM visit with the clinical pharmacist.  Reason for Encounter: Chart Prep   Conditions to be addressed/monitored: GERD, Insomnia, Memory loss  Primary concerns for visit include: Memory changes  Recent office visits:   07/16/20 Luiz Ochoa, NP. For vaginal candidiasis. STARTED Fluconazole 150 mg Take one tablet by mouth for one dose, may repeat dosing in 3 days for recurring symptoms, may repeat for two additional doses and Nitrofurantion Monohyd Macro 100 mg 2 times daily. 04/30/20 Lavera Guise, MD. For menopausal. STOPPED Doxycycline Hyclate 04/12/20 Lavera Guise, MD. For acute right hip pain/hypertension. STARTED Cyclobenzaprine 10 mg daily at bedtime and Meloxicam 15 mg daily.  04/11/20 (Telephone) Per note patient started back taking Ambien 10 mg 1/2 tablet po at bedtime as needed. 03/28/20 Ronnell Freshwater, NP. For acute recurrent frontal sinusitis/depression. STARTED Doxycycline Hyclate 100 mg 2 times daily. 03/12/20 Ronnell Freshwater, NP. For acute nonrecurring pansinuitis. Per note: The patient recently fell and hit her head but did not lose consciousness. STARTED Chlorhexidine Gluconate 0.12% BID prn and Sulfamethoxazole-trimethoprim 400-80 mg 1 tablet 2 times daily.  02/06/20 Lavera Guise, MD. For menopausal/Depression/Hypertension. STARTED Estradiol 0.0375 MG/24HR.STOPPED Biotin and multiple vitamins-minerals.   Recent consult visits:  06/10/20 Joellyn Rued, MD. For GERD. No medication changes.  03/04/20 Orthopedic Surgery Retia Passe. For low back pain and right knee pain. 03/14/20 Neurology Sunday Corn Donalee Citrin, [place]. For memory loss. Per note: A home sleep study is being ordered and encouraged physical activity on a regular basis. STARTED Namenda 5 mg take 1 tablet two times daily. Norlene Campbell visits:  None in previous 6 months   Medication History: Gabapentin 300 mg   Medications: Outpatient Encounter Medications as of 07/30/2020  Medication Sig   Ascorbic Acid (VITAMIN C PO) Take 1,000 mg by mouth daily.   aspirin EC 81 MG tablet Take 81 mg by mouth daily.   chlorhexidine (PERIDEX) 0.12 % solution Swish and spit with 45mls BID prn   Cholecalciferol (VITAMIN D3 PO) Take 10,000 Units by mouth daily.   Clobetasol Propionate 0.05 % shampoo Use as directed twice weekly prn   conjugated estrogens (PREMARIN) vaginal cream Use 1 applicatorful vaginally 2 times weekly   cyclobenzaprine (FLEXERIL) 10 MG tablet TAKE 1 TABLET BY MOUTH EVERYDAY AT BEDTIME   desloratadine (CLARINEX) 5 MG tablet Take 1 tablet (5 mg total) by mouth daily.   DULoxetine (CYMBALTA) 30 MG capsule Take 1 capsule (30 mg total) by mouth daily.   estradiol (VIVELLE-DOT) 0.0375 MG/24HR Place 1 patch onto the skin 2 (two) times a week.   fluconazole (DIFLUCAN) 150 MG tablet Take one tablet by mouth for one dose, may repeat dosing in 3 days for recurring symptoms, may repeat for two additional doses.   fluticasone (FLONASE) 50 MCG/ACT nasal spray Place 2 sprays into both nostrils as needed.   gabapentin (NEURONTIN) 300 MG capsule Take 1 capsule (300 mg total) by mouth at bedtime.   meloxicam (MOBIC) 15 MG tablet Take 1 tablet (15 mg total) by mouth daily.   mirabegron ER (MYRBETRIQ) 50 MG TB24 tablet Take 1 tablet (50 mg total) by mouth daily.   nitrofurantoin, macrocrystal-monohydrate, (MACROBID) 100 MG capsule Take 1 capsule (100 mg total) by mouth 2 (two) times daily.   pantoprazole (PROTONIX)  40 MG tablet Take 1 tablet (40 mg total) by mouth daily.   triamcinolone lotion (KENALOG) 0.1 % Apply topically 2 (two) times daily.   VITAMIN E PO Take 450 mg by mouth daily.   zolpidem (AMBIEN) 10 MG tablet Take 1/2 to 1 tablet po QHS prn insomnia   No facility-administered encounter medications on file  as of 07/30/2020.   Have you seen any other providers since your last visit? Patient stated no.  Any changes in your medications or health? Patient stated her memantine has been increased to 10 mg.  Any side effects from any medications? Patient stated no.  Do you have an symptoms or problems not managed by your medications? Patient stated no.  Any concerns about your health right now? Patient stated no.  Has your provider asked that you check blood pressure, blood sugar, or follow special diet at home? Patient stated she is checks her blood pressure some times   Do you get any type of exercise on a regular basis? Patient stated she walks 3 times a week for about 20-30 min  Can you think of a goal you would like to reach for your health? Patient stated mediterranean diet.   Do you have any problems getting your medications? Patient stated no.  Is there anything that you would like to discuss during the appointment? Patient stated no.  Please bring medications and supplements to appointment, patient reminded of her OTP appointment on 07/31/20 at 3 pm.   Rosalia, [place] Pharmacist Assistant [phone]

## 2020-07-31 ENCOUNTER — Ambulatory Visit: Payer: Medicare HMO | Admitting: Pharmacist

## 2020-07-31 DIAGNOSIS — K219 Gastro-esophageal reflux disease without esophagitis: Secondary | ICD-10-CM

## 2020-07-31 DIAGNOSIS — F321 Major depressive disorder, single episode, moderate: Secondary | ICD-10-CM

## 2020-07-31 DIAGNOSIS — E038 Other specified hypothyroidism: Secondary | ICD-10-CM

## 2020-07-31 NOTE — Progress Notes (Signed)
Chronic Care Management Pharmacy Note  08/01/2020 Name:  [name] [name] MRN:  [id number] DOB:  May 22, 1955  Subjective: [name] [name] is an 65 y.o. year old female who is a primary patient of Humphrey Rolls Timoteo Gaul, MD.  The CCM team was consulted for assistance with disease management and care coordination needs.    Engaged with patient by telephone for initial visit in response to provider referral for pharmacy case management and/or care coordination services.   Consent to Services:  The patient was given the following information about Chronic Care Management services today, agreed to services, and gave verbal consent: 1. CCM service includes personalized support from designated clinical staff supervised by the primary care provider, including individualized plan of care and coordination with other care providers 2. 24/7 contact phone numbers for assistance for urgent and routine care needs. 3. Service will only be billed when office clinical staff spend 20 minutes or more in a month to coordinate care. 4. Only one practitioner may furnish and bill the service in a calendar month. 5.The patient may stop CCM services at any time (effective at the end of the month) by phone call to the office staff. 6. The patient will be responsible for cost sharing (co-pay) of up to 20% of the service fee (after annual deductible is met). Patient agreed to services and consent obtained.  Patient Care Team: Lavera Guise, MD as PCP - General (Internal Medicine) Edythe Clarity, [facility] as Pharmacist (Pharmacist)  Recent office visits: 07/16/20 Luiz Ochoa, NP. For vaginal candidiasis. STARTED Fluconazole 150 mgTake one tablet by mouth for one dose, may repeat dosing in 3 days for recurring symptoms, may repeat for two additional doses and Nitrofurantion Monohyd Macro 100 mg 2 times daily. 04/30/20 Lavera Guise, MD. For menopausal. STOPPED Doxycycline Hyclate 04/12/20 Lavera Guise, MD. For acute right hip  pain/hypertension. STARTED Cyclobenzaprine 10 mg daily at bedtime and Meloxicam 15 mg daily.  04/11/20 (Telephone) Per note patient started back taking Ambien 10 mg 1/2 tablet po at bedtime as needed. 03/28/20 Ronnell Freshwater, NP. For acute recurrent frontal sinusitis/depression. STARTED Doxycycline Hyclate 100 mg 2 times daily. 03/12/20 Ronnell Freshwater, NP. For acute nonrecurring pansinuitis. Per note: The paient recently fell and hit her head but did not lose consciousness. STARTED Chlorhexidine Gluconate 0.12% BID prn and Sulfamethoxazole-trimethoprim 400-80 mg 1 tablet 2 times daily.  02/06/20 Lavera Guise, MD. For menopausal/Depression/Hypertneison. STARTED Estradiol 0.0375 MG/24HR.STOPPED Biotin and multiple vitamins-minerals.   Recent consult visits: 06/10/20 Joellyn Rued, MD. For GERD. No medication changes.  03/04/20 Orthopedic Surgery Retia Passe. For low back pain and right knee pain. 03/14/20 Neurology Sunday Corn Donalee Citrin, [place]. For memory loss. Per note: A home sleep study is being ordered and encouraged physical activity on a regular basis. STARTED Namenda 5 mg take 1 tablet two times daily. Norlene Campbell visits: None in previous 6 months  Objective:  Lab Results  Component Value Date   CREATININE 0.77 06/27/2019   BUN 12 06/27/2019   GFRNONAA 81 06/27/2019   GFRAA 94 06/27/2019   NA 138 06/27/2019   K 4.4 06/27/2019   CALCIUM 9.6 06/27/2019   CO2 24 06/27/2019   GLUCOSE 80 06/27/2019    No results found for: HGBA1C, FRUCTOSAMINE, GFR, MICROALBUR  Last diabetic Eye exam: No results found for: HMDIABEYEEXA  Last diabetic Foot exam: No results found for: HMDIABFOOTEX   Lab Results  Component Value Date   CHOL 195  06/27/2019   HDL 63 06/27/2019   LDLCALC 112 (H) 06/27/2019   TRIG 111 06/27/2019   CHOLHDL 3.1 06/27/2019    Hepatic Function Latest Ref Rng & Units 06/27/2019 06/21/2018 04/22/2017  Total Protein 6.0 - 8.5 g/dL 7.0 6.6 6.9  Albumin  3.8 - 4.8 g/dL 4.5 4.2 4.3  AST 0 - 40 IU/L $Remov'18 14 19  'JDCQke$ ALT 0 - 32 IU/L $Remov'13 9 8  'iktksE$ Alk Phosphatase 39 - 117 IU/L 87 56 60  Total Bilirubin 0.0 - 1.2 mg/dL 0.7 0.4 0.7    Lab Results  Component Value Date/Time   TSH 1.090 06/27/2020 01:20 PM   TSH 1.140 09/18/2019 11:27 AM   FREET4 0.96 06/27/2020 01:20 PM   FREET4 1.27 09/18/2019 11:27 AM    CBC Latest Ref Rng & Units 12/08/2019 09/18/2019 06/27/2019  WBC 3.4 - 10.8 x10E3/uL 5.9 5.1 4.6  Hemoglobin 11.1 - 15.9 g/dL 11.6 11.8 13.0  Hematocrit 34.0 - 46.6 % 36.1 35.0 39.6  Platelets 150 - 450 x10E3/uL 260 247 258    Lab Results  Component Value Date/Time   VD25OH 48.9 06/21/2018 08:17 AM    Clinical ASCVD: No  The ASCVD Risk score ([place]., et al., 2013) failed to calculate for the following reasons:   Unable to determine if patient is Non-Hispanic African American    Depression screen [facility] 2/9 04/30/2020 02/06/2020 12/08/2019  Decreased Interest 0 0 0  Down, Depressed, Hopeless 0 0 0  PHQ - 2 Score 0 0 0     Social History   Tobacco Use  Smoking Status Never Smoker  Smokeless Tobacco Never Used   BP Readings from Last 3 Encounters:  07/16/20 138/79  06/10/20 (!) 157/88  04/30/20 135/78   Pulse Readings from Last 3 Encounters:  07/16/20 84  06/10/20 71  04/30/20 91   Wt Readings from Last 3 Encounters:  07/16/20 149 lb (67.6 kg)  06/10/20 149 lb 9.6 oz (67.9 kg)  04/30/20 150 lb 9.6 oz (68.3 kg)   BMI Readings from Last 3 Encounters:  07/16/20 25.58 kg/m  06/10/20 25.68 kg/m  04/30/20 25.85 kg/m    Assessment/Interventions: Review of patient past medical history, allergies, medications, health status, including review of consultants reports, laboratory and other test data, was performed as part of comprehensive evaluation and provision of chronic care management services.   SDOH:  (Social Determinants of Health) assessments and interventions performed: Yes   Financial Resource Strain: Low Risk   .  Difficulty of Paying Living Expenses: Not very hard    SDOH Screenings   Alcohol Screen: Low Risk   . Last Alcohol Screening Score (AUDIT): 1  Depression (PHQ2-9): Low Risk   . PHQ-2 Score: 0  Financial Resource Strain: Low Risk   . Difficulty of Paying Living Expenses: Not very hard  Food Insecurity: Not on file  Housing: Not on file  Physical Activity: Not on file  Social Connections: Not on file  Stress: Not on file  Tobacco Use: Low Risk   . Smoking Tobacco Use: Never Smoker  . Smokeless Tobacco Use: Never Used  Transportation Needs: Not on file    CCM Care Plan  Allergies  Allergen Reactions  . Amoxicillin Nausea Only  . Azithromycin Nausea Only and Other (See Comments)    Z-Pak: Questionable  . Ciprofloxacin Nausea Only  . Dicyclomine Nausea Only and Nausea And Vomiting  . Other Nausea Only    "Pnenazopyrid"  . Oxybutynin Nausea Only and Nausea And Vomiting  .  Penicillins Nausea And Vomiting and Nausea Only  . Phenazopyridine Nausea Only    Medications Reviewed Today    Reviewed by Edythe Clarity, [facility] (Pharmacist) on 08/01/20 at 1433  Med List Status: <None>  Medication Order Taking? Sig Documenting Provider Last Dose Status Informant        Discontinued 07/31/20 1505 (Discontinued by provider)   aspirin EC 81 MG tablet [id number] Yes Take 81 mg by mouth daily. [provider] Taking Active         Discontinued 07/31/20 1506 (Completed Course)   Cholecalciferol (VITAMIN D3 PO) [id number] No Take 10,000 Units by mouth daily.  Patient not taking: Reported on 07/31/2020   [provider] Not Taking Active   Clobetasol Propionate 0.05 % shampoo [id number] Yes Use as directed twice weekly prn Ronnell Freshwater, NP Taking Active         Discontinued 07/31/20 1532 (Patient Preference)   cyclobenzaprine (FLEXERIL) 10 MG tablet [id number] No TAKE 1 TABLET BY MOUTH EVERYDAY AT BEDTIME  Patient not taking: Reported on 07/31/2020   Lavera Guise, MD Not  Taking Active         Discontinued 07/31/20 1512 (Patient Preference)   DULoxetine (CYMBALTA) 30 MG capsule [id number] Yes Take 1 capsule (30 mg total) by mouth daily. Lavera Guise, MD Taking Active   estradiol (VIVELLE-DOT) 0.0375 Rubbie Battiest [id number] Yes Place 1 patch onto the skin 2 (two) times a week. Lavera Guise, MD Taking Active         Discontinued 07/31/20 1525 (Completed Course)   fluticasone (FLONASE) 50 MCG/ACT nasal spray [id number] Yes Place 2 sprays into both nostrils as needed. Lavera Guise, MD Taking Active   gabapentin (NEURONTIN) 300 MG capsule [id number]  Take 1 capsule (300 mg total) by mouth at bedtime. Ronnell Freshwater, NP  Active         Discontinued 07/31/20 1507 (Patient Preference)   memantine (NAMENDA) 10 MG tablet [id number] Yes Take 10 mg by mouth 2 (two) times daily. [provider] Taking Active   mirabegron ER (MYRBETRIQ) 50 MG TB24 tablet [id number] Yes Take 1 tablet (50 mg total) by mouth daily. Lavera Guise, MD Taking Active         Discontinued 07/31/20 1525 (Completed Course)   pantoprazole (PROTONIX) 40 MG tablet [id number] Yes Take 1 tablet (40 mg total) by mouth daily.  Patient taking differently: Take 20 mg by mouth daily.   Lavera Guise, MD Taking Active   triamcinolone lotion (KENALOG) 0.1 % [id number] Yes Apply topically 2 (two) times daily. Lavera Guise, MD Taking Active   VITAMIN E PO [id number] Yes Take 450 mg by mouth daily. [provider] Taking Active   zolpidem (AMBIEN) 10 MG tablet [id number]  Take 1/2 to 1 tablet po QHS prn insomnia Ronnell Freshwater, NP  Active           Patient Active Problem List   Diagnosis Date Noted  . Acute recurrent frontal sinusitis 04/18/2020  . Acute non-recurrent pansinusitis 04/15/2020  . Oral mucositis 04/15/2020  . Chronic pansinusitis 12/14/2019  . Chronic allergic rhinitis 12/14/2019  . Episode of recurrent major depressive disorder ([place]) 12/14/2019  . Thyromegaly 09/24/2019  .  Encounter for general adult medical examination with abnormal findings 07/12/2019  . Subclinical hypothyroidism 07/12/2019  . Menopausal and perimenopausal disorder 07/12/2019  . Psoriasis of scalp 07/12/2019  . Lymphadenitis 04/24/2019  . Radicular pain in right arm 03/29/2019  . Cervical disc disorder with  radiculopathy 03/29/2019  . Lumbar disc disease with radiculopathy 03/29/2019  . Elevated blood pressure reading 03/29/2019  . Muscle spasm 02/09/2019  . Depression, major, single episode, moderate ([place]) 02/09/2019  . Primary insomnia 01/19/2019  . Pain of both hip joints 11/24/2018  . Grief reaction 10/12/2018  . Memory changes 10/07/2018  . Other fatigue 10/07/2018  . Screening for osteoporosis 06/20/2018  . Leg cramps 06/20/2018  . Chest pain 06/20/2018  . Gastroesophageal reflux disease without esophagitis 06/20/2018  . Primary narcolepsy without cataplexy 06/20/2018  . Screening for cervical cancer 06/20/2018  . Urinary tract infection with hematuria 03/28/2018  . Dysuria 03/28/2018  . Pharyngoesophageal dysphagia 10/07/2017  . Laryngopharyngeal reflux (LPR) 10/07/2017  . Hoarseness 10/07/2017  . Ovarian mass 07/08/2017  . Cysts of both ovaries 05/26/2017  . Increased endometrial stripe thickness 05/26/2017  . Post-menopausal bleeding 05/26/2017  . Unspecified menopausal and perimenopausal disorder 05/26/2017  . Overactive bladder 05/26/2017    Immunization History  Administered Date(s) Administered  . Influenza Inj Mdck Quad Pf 01/20/2017  . Influenza, High Dose Seasonal PF 11/25/2018  . Influenza, Quadrivalent, Recombinant, Inj, Pf 12/04/2017  . PFIZER(Purple Top)SARS-COV-2 Vaccination 05/14/2019, 06/06/2019, 01/27/2020  . Zoster Recombinat (Shingrix) 12/04/2017, 03/25/2018    Conditions to be addressed/monitored:  Allergic rhinitis, GERD, Hypothyroidism, Insomnia, Depression  Care Plan : General Pharmacy (Adult)  Updates made by Edythe Clarity, RPH  since 08/01/2020 12:00 AM    Problem: Allergic rhinitis, GERD, Hypothyroidism, Insomnia, Depression, HLD   Priority: High  Onset Date: 07/31/2020    Long-Range Goal: Patient-Specific Goal   Start Date: 07/31/2020  Expected End Date: 02/01/2021  This Visit's Progress: On track  Priority: High  Note:   Current Barriers:  . Unable to achieve control of cholesterol.   Pharmacist Clinical Goal(s):  Marland Kitchen Patient will achieve adherence to monitoring guidelines and medication adherence to achieve therapeutic efficacy . achieve control of cholesterol as evidenced by lipid screening . contact provider office for questions/concerns as evidenced notation of same in electronic health record through collaboration with PharmD and provider.   Interventions: . 1:1 collaboration with Lavera Guise, MD regarding development and update of comprehensive plan of care as evidenced by provider attestation and co-signature . Inter-disciplinary care team collaboration (see longitudinal plan of care) . Comprehensive medication review performed; medication list updated in electronic medical record  Depression/Anxiety (Goal: Control symptoms) -Controlled -Current treatment: . Duloxetine $RemoveBefo'30mg'yzZkZSdAyLv$  capsule -Medications previously tried/failed: none noted -Educated on Benefits of medication for symptom control secondary effect to help with nerve pain. -Recommended to continue current medication Could consider dose increase in the future if patient still having some neuropathy.  Hyperlipidemia?: (LDL goal < 100) -Not ideally controlled -Current treatment: . None -Medications previously tried: none noted  -Current dietary patterns: typically mediterranean style diet -Current exercise habits: walks 20-30 minutes a few times per week -Educated on Cholesterol goals;  Importance of limiting foods high in cholesterol; Discussed most recent lipid panel  -Patient would benefit from updated lipid panel, it was not done in her  last lab workup. -Recommended repeat lipid panel, if LDL still elevated she may benefit from addition of statin medication for ASCVD risk.   GERD (Goal: Control Symptoms) -Controlled -Current treatment  . Pantoprazole $RemoveBefore'40mg'[id number]$  daily -Medications previously tried: none noted -Counseled on taking 30-60 minutes before meal that causes the most symptoms on empty stomach.  Patient has been taking accordingly.  Reports her symptoms have been controlled.   Hypothyroidism (Goal: Maintain TSH) -Uncontrolled -Current treatment  .  None -Medications previously tried: none -TSH WNL at last check patient on not thyroid management at this time. -Recommended continue with yearly screenings of TSH,  Allergic Rhinitis (Goal: Minimize symptoms) -Controlled -Current treatment  . Flonase 75mcg nasal spray . Cetirizine $RemoveBefo'10mg'HDIdllaWchl$  OTC -Medications previously tried: Desloratadine (Cost) -Reports Clarinex was expensive at pharmacy so she switched to OTC cetirizine.  Seems to be working.  -Counseled on alternative of Xyzal (levocetirizine) that we may also be able to call in as Rx so she can use insurance to help with copay.   Patient Goals/Self-Care Activities . Patient will:  - take medications as prescribed check blood pressure weekly, document, and provide at future appointments target a minimum of 150 minutes of moderate intensity exercise weekly  Follow Up Plan: The care management team will reach out to the patient again over the next 180 days.         Medication Assistance: None required.  Patient affirms current coverage meets needs.  Patient's preferred pharmacy is:  [id number] [address], [place] - [place] [address] Phone: [phone] Fax: [place], [place] - [address] [place] [address] Phone: [phone] Fax: [phone]  [place], [place] [address] Phone: [phone] Fax: [place], [place] [address] Phone: [phone] Fax: [phone]  Uses pill box? Yes Pt endorses 100% compliance  We discussed: Benefits of medication synchronization, packaging and delivery as well as enhanced pharmacist oversight with [employer]. Patient decided to: Continue current medication management strategy  Care Plan and Follow Up Patient Decision:  Patient agrees to Care Plan and Follow-up.  Plan: The care management team will reach out to the patient again over the next 180 days.  Beverly Milch, PharmD Clinical Pharmacist [facility] [phone]

## 2020-08-01 NOTE — Patient Instructions (Addendum)
Visit Information  Goals Addressed            This Visit's Progress   . Manage My Medicine       Timeframe:  Long-Range Goal Priority:  High Start Date:     07/31/20                        Expected End Date:    01/31/21                   Follow Up Date 10/31/20   - keep a list of all the medicines I take; vitamins and herbals too - use a pillbox to sort medicine - use an alarm clock or phone to remind me to take my medicine    Why is this important?   . These steps will help you keep on track with your medicines.   Notes:       Patient Care Plan: General Pharmacy (Adult)    Problem Identified: Allergic rhinitis, GERD, Hypothyroidism, Insomnia, Depression, HLD   Priority: High  Onset Date: 07/31/2020    Long-Range Goal: Patient-Specific Goal   Start Date: 07/31/2020  Expected End Date: 02/01/2021  This Visit's Progress: On track  Priority: High  Note:   Current Barriers:  . Unable to achieve control of cholesterol.   Pharmacist Clinical Goal(s):  Marland Kitchen Patient will achieve adherence to monitoring guidelines and medication adherence to achieve therapeutic efficacy . achieve control of cholesterol as evidenced by lipid screening . contact provider office for questions/concerns as evidenced notation of same in electronic health record through collaboration with PharmD and provider.   Interventions: . 1:1 collaboration with Lavera Guise, MD regarding development and update of comprehensive plan of care as evidenced by provider attestation and co-signature . Inter-disciplinary care team collaboration (see longitudinal plan of care) . Comprehensive medication review performed; medication list updated in electronic medical record  Depression/Anxiety (Goal: Control symptoms) -Controlled -Current treatment: . Duloxetine 30mg  capsule -Medications previously tried/failed: none noted -Educated on Benefits of medication for symptom control secondary effect to help with nerve  pain. -Recommended to continue current medication Could consider dose increase in the future if patient still having some neuropathy.  Hyperlipidemia?: (LDL goal < 100) -Not ideally controlled -Current treatment: . None -Medications previously tried: none noted  -Current dietary patterns: typically mediterranean style diet -Current exercise habits: walks 20-30 minutes a few times per week -Educated on Cholesterol goals;  Importance of limiting foods high in cholesterol; Discussed most recent lipid panel  -Patient would benefit from updated lipid panel, it was not done in her last lab workup. -Recommended repeat lipid panel, if LDL still elevated she may benefit from addition of statin medication for ASCVD risk.   GERD (Goal: Control Symptoms) -Controlled -Current treatment  . Pantoprazole 40mg  daily -Medications previously tried: none noted -Counseled on taking 30-60 minutes before meal that causes the most symptoms on empty stomach.  Patient has been taking accordingly.  Reports her symptoms have been controlled.   Hypothyroidism (Goal: Maintain TSH) -Uncontrolled -Current treatment  . None -Medications previously tried: none -TSH WNL at last check patient on not thyroid management at this time. -Recommended continue with yearly screenings of TSH,  Allergic Rhinitis (Goal: Minimize symptoms) -Controlled -Current treatment  . Flonase 39mcg nasal spray . Cetirizine 10mg  OTC -Medications previously tried: Desloratadine (Cost) -Reports Clarinex was expensive at pharmacy so she switched to OTC cetirizine.  Seems to be working.  -Counseled on alternative  of Xyzal (levocetirizine) that we may also be able to call in as Rx so she can use insurance to help with copay.   Patient Goals/Self-Care Activities . Patient will:  - take medications as prescribed check blood pressure weekly, document, and provide at future appointments target a minimum of 150 minutes of moderate  intensity exercise weekly  Follow Up Plan: The care management team will reach out to the patient again over the next 180 days.        Ms. Ladouceur was given information about Chronic Care Management services today including:  1. CCM service includes personalized support from designated clinical staff supervised by her physician, including individualized plan of care and coordination with other care providers 2. 24/7 contact phone numbers for assistance for urgent and routine care needs. 3. Standard insurance, coinsurance, copays and deductibles apply for chronic care management only during months in which we provide at least 20 minutes of these services. Most insurances cover these services at 100%, however patients may be responsible for any copay, coinsurance and/or deductible if applicable. This service may help you avoid the need for more expensive face-to-face services. 4. Only one practitioner may furnish and bill the service in a calendar month. 5. The patient may stop CCM services at any time (effective at the end of the month) by phone call to the office staff.  Patient agreed to services and verbal consent obtained.   The patient verbalized understanding of instructions, educational materials, and care plan provided today and agreed to receive a mailed copy of patient instructions, educational materials, and care plan.  Telephone follow up appointment with pharmacy team member scheduled for: 4 months  [facility], [employer] for Gastroesophageal Reflux Disease, Adult When you have gastroesophageal reflux disease (GERD), the foods you eat and your eating habits are very important. Choosing the right foods can help ease the discomfort of GERD. Consider working with a dietitian to help you make healthy food choices. What are tips for following this plan? Reading food labels  Look for foods that are low in saturated fat. Foods that have less than 5% of daily value (DV)  of fat and 0 g of trans fats may help with your symptoms. Cooking  Cook foods using methods other than frying. This may include baking, steaming, grilling, or broiling. These are all methods that do not need a lot of fat for cooking.  To add flavor, try to use herbs that are low in spice and acidity. Meal planning  Choose healthy foods that are low in fat, such as fruits, vegetables, whole grains, low-fat dairy products, lean meats, fish, and poultry.  Eat frequent, small meals instead of three large meals each day. Eat your meals slowly, in a relaxed setting. Avoid bending over or lying down until 2-3 hours after eating.  Limit high-fat foods such as fatty meats or fried foods.  Limit your intake of fatty foods, such as oils, butter, and shortening.  Avoid the following as told by your health care provider: ? Foods that cause symptoms. These may be different for different people. Keep a food diary to keep track of foods that cause symptoms. ? Alcohol. ? Drinking large amounts of liquid with meals. ? Eating meals during the 2-3 hours before bed.   Lifestyle  Maintain a healthy weight. Ask your health care provider what weight is healthy for you. If you need to lose weight, work with your health care provider to do so safely.  Exercise for at least 30 minutes on 5 or more days each week, or as told by your health care provider.  Avoid wearing clothes that fit tightly around your waist and chest.  Do not use any products that contain nicotine or tobacco. These products include cigarettes, chewing tobacco, and vaping devices, such as e-cigarettes. If you need help quitting, ask your health care provider.  Sleep with the head of your bed raised. Use a wedge under the mattress or blocks under the bed frame to raise the head of the bed.  Chew sugar-free gum after mealtimes. What foods should I eat? Eat a healthy, well-balanced diet of fruits, vegetables, whole grains, low-fat dairy  products, lean meats, fish, and poultry. Each person is different. Foods that may trigger symptoms in one person may not trigger any symptoms in another person. Work with your health care provider to identify foods that are safe for you. The items listed above may not be a complete list of recommended foods and beverages. Contact a dietitian for more information.   What foods should I avoid? Limiting some of these foods may help manage the symptoms of GERD. Everyone is different. Consult a dietitian or your health care provider to help you identify the exact foods to avoid, if any. Fruits Any fruits prepared with added fat. Any fruits that cause symptoms. For some people this may include citrus fruits, such as oranges, grapefruit, pineapple, and lemons. Vegetables Deep-fried vegetables. [place] fries. Any vegetables prepared with added fat. Any vegetables that cause symptoms. For some people, this may include tomatoes and tomato products, chili peppers, onions and garlic, and horseradish. Grains Pastries or quick breads with added fat. Meats and other proteins High-fat meats, such as fatty beef or pork, hot dogs, ribs, ham, sausage, salami, and bacon. Fried meat or protein, including fried fish and fried chicken. Nuts and nut butters, in large amounts. Dairy Whole milk and chocolate milk. Sour cream. Cream. Ice cream. Cream cheese. Milkshakes. Fats and oils Butter. Margarine. Shortening. Ghee. Beverages Coffee and tea, with or without caffeine. Carbonated beverages. Sodas. Energy drinks. Fruit juice made with acidic fruits, such as orange or grapefruit. Tomato juice. Alcoholic drinks. Sweets and desserts Chocolate and cocoa. Donuts. Seasonings and condiments Pepper. Peppermint and spearmint. Added salt. Any condiments, herbs, or seasonings that cause symptoms. For some people, this may include curry, hot sauce, or vinegar-based salad dressings. The items listed above may not be a complete list  of foods and beverages to avoid. Contact a dietitian for more information. Questions to ask your health care provider Diet and lifestyle changes are usually the first steps that are taken to manage symptoms of GERD. If diet and lifestyle changes do not improve your symptoms, talk with your health care provider about taking medicines. Where to find more information  International Foundation for Gastrointestinal Disorders: aboutgerd.org Summary  When you have gastroesophageal reflux disease (GERD), food and lifestyle choices may be very helpful in easing the discomfort of GERD.  Eat frequent, small meals instead of three large meals each day. Eat your meals slowly, in a relaxed setting. Avoid bending over or lying down until 2-3 hours after eating.  Limit high-fat foods such as fatty meats or fried foods. This information is not intended to replace advice given to you by your health care provider. Make sure you discuss any questions you have with your health care provider. Document Revised: 09/25/2019 Document Reviewed: 09/25/2019 Elsevier Patient Education  [place].

## 2020-08-05 ENCOUNTER — Other Ambulatory Visit: Payer: Self-pay

## 2020-08-05 ENCOUNTER — Ambulatory Visit (INDEPENDENT_AMBULATORY_CARE_PROVIDER_SITE_OTHER): Payer: Medicare HMO | Admitting: Physician Assistant

## 2020-08-05 ENCOUNTER — Encounter: Payer: Self-pay | Admitting: Physician Assistant

## 2020-08-05 DIAGNOSIS — E041 Nontoxic single thyroid nodule: Secondary | ICD-10-CM | POA: Diagnosis not present

## 2020-08-05 DIAGNOSIS — Z0001 Encounter for general adult medical examination with abnormal findings: Secondary | ICD-10-CM

## 2020-08-05 DIAGNOSIS — N951 Menopausal and female climacteric states: Secondary | ICD-10-CM | POA: Diagnosis not present

## 2020-08-05 DIAGNOSIS — Z1212 Encounter for screening for malignant neoplasm of rectum: Secondary | ICD-10-CM | POA: Diagnosis not present

## 2020-08-05 DIAGNOSIS — R21 Rash and other nonspecific skin eruption: Secondary | ICD-10-CM | POA: Diagnosis not present

## 2020-08-05 DIAGNOSIS — Z124 Encounter for screening for malignant neoplasm of cervix: Secondary | ICD-10-CM | POA: Diagnosis not present

## 2020-08-05 DIAGNOSIS — Z1211 Encounter for screening for malignant neoplasm of colon: Secondary | ICD-10-CM

## 2020-08-05 DIAGNOSIS — R300 Dysuria: Secondary | ICD-10-CM | POA: Diagnosis not present

## 2020-08-05 DIAGNOSIS — R5382 Chronic fatigue, unspecified: Secondary | ICD-10-CM

## 2020-08-05 DIAGNOSIS — Z113 Encounter for screening for infections with a predominantly sexual mode of transmission: Secondary | ICD-10-CM

## 2020-08-05 DIAGNOSIS — R69 Illness, unspecified: Secondary | ICD-10-CM | POA: Diagnosis not present

## 2020-08-05 MED ORDER — DULOXETINE HCL 30 MG PO CPEP: 30.0000 mg | ORAL_CAPSULE | Freq: Every day | ORAL | 1 refills | Status: DC

## 2020-08-05 NOTE — Progress Notes (Signed)
[name] [place], [place] [address]  Internal MEDICINE  Office Visit Note  Patient Name: [name] [name]  [id number]  [id number]  Date of Service: 08/06/2020  Chief Complaint  Patient presents with  . Medicare Wellness    Red rash on right forearm with raised little spots, started yesterday, update med list   . Depression  . Hypertension     HPI Pt is here for routine health maintenance examination -Rash on right arm just started a few days ago, not very bothersome. Denies any medication, detergent or lotion changes. -takes 1/2 tab prempro every other day and will have pink discharge if she forgets it. Also on estradiol patch. -She is due for her pap and colonoscopy, she is not due for her mammogram until August -She is requesting her cymbalta refill today and states she does well on this  Current Medication: Outpatient Encounter Medications as of 08/05/2020  Medication Sig  . aspirin EC 81 MG tablet Take 81 mg by mouth daily.  . Clobetasol Propionate 0.05 % shampoo Use as directed twice weekly prn  . estradiol (VIVELLE-DOT) 0.0375 MG/24HR Place 1 patch onto the skin 2 (two) times a week.  . fluticasone (FLONASE) 50 MCG/ACT nasal spray Place 2 sprays into both nostrils as needed.  . memantine (NAMENDA) 10 MG tablet Take 10 mg by mouth 2 (two) times daily.  . mirabegron ER (MYRBETRIQ) 50 MG TB24 tablet Take 1 tablet (50 mg total) by mouth daily.  . pantoprazole (PROTONIX) 40 MG tablet Take 1 tablet (40 mg total) by mouth daily. (Patient taking differently: Take 20 mg by mouth daily.)  . triamcinolone lotion (KENALOG) 0.1 % Apply topically 2 (two) times daily.  Marland Kitchen VITAMIN E PO Take 450 mg by mouth daily.  . [DISCONTINUED] DULoxetine (CYMBALTA) 30 MG capsule Take 1 capsule (30 mg total) by mouth daily.  . DULoxetine (CYMBALTA) 30 MG capsule Take 1 capsule (30 mg total) by mouth daily.  . [DISCONTINUED] Cholecalciferol (VITAMIN D3 PO) Take 10,000 Units  by mouth daily. (Patient not taking: Reported on 07/31/2020)  . [DISCONTINUED] cyclobenzaprine (FLEXERIL) 10 MG tablet TAKE 1 TABLET BY MOUTH EVERYDAY AT BEDTIME (Patient not taking: Reported on 07/31/2020)  . [DISCONTINUED] gabapentin (NEURONTIN) 300 MG capsule Take 1 capsule (300 mg total) by mouth at bedtime.  . [DISCONTINUED] zolpidem (AMBIEN) 10 MG tablet Take 1/2 to 1 tablet po QHS prn insomnia   No facility-administered encounter medications on file as of 08/05/2020.    Surgical History: Past Surgical History:  Procedure Laterality Date  . BREAST BIOPSY Right 2016   cor bx   BENIGN BREAST LOBULES AND FIBROADIPOSE TISSUE   . ruptured disc  1992    Medical History: Past Medical History:  Diagnosis Date  . Allergic rhinitis   . Depression   . GERD (gastroesophageal reflux disease)   . Hypertension   . Postmenopausal disorder     Family History: Family History  Problem Relation Age of Onset  . Asthma Mother   . Diabetes Mother   . Hypertension Mother   . Stroke Mother   . Epilepsy Mother   . Coronary artery disease Mother   . Breast cancer Neg Hx       Review of Systems  Constitutional: Negative for chills, fatigue and unexpected weight change.  HENT: Negative for congestion, postnasal drip, rhinorrhea, sneezing and sore throat.   Eyes: Negative for redness.  Respiratory: Negative for cough, chest tightness and shortness of breath.  Cardiovascular: Negative for chest pain and palpitations.  Gastrointestinal: Negative for abdominal pain, constipation, diarrhea, nausea and vomiting.  Genitourinary: Positive for vaginal discharge. Negative for dysuria and frequency.  Musculoskeletal: Negative for arthralgias, back pain, joint swelling and neck pain.  Skin: Positive for rash.       Rash on right arm  Neurological: Negative.  Negative for tremors and numbness.  Hematological: Negative for adenopathy. Does not bruise/bleed easily.  Psychiatric/Behavioral: Negative for  behavioral problems (Depression), sleep disturbance and suicidal ideas. The patient is not nervous/anxious.      Vital Signs: BP (!) 148/88   Pulse 80   Temp (!) 97.3 F (36.3 C)   Resp 16   Ht 5\' 5"  (1.651 m)   Wt 148 lb 3.2 oz (67.2 kg)   LMP 08/09/2014   SpO2 98%   BMI 24.66 kg/m    Physical Exam Vitals and nursing note reviewed. Exam conducted with a chaperone present.  Constitutional:      General: She is not in acute distress.    Appearance: She is well-developed and normal weight. She is not diaphoretic.  HENT:     Head: Normocephalic and atraumatic.     Right Ear: External ear normal.     Left Ear: External ear normal.     Nose: Nose normal.     Mouth/Throat:     Pharynx: No oropharyngeal exudate.  Eyes:     General: No scleral icterus.       Right eye: No discharge.        Left eye: No discharge.     Conjunctiva/sclera: Conjunctivae normal.     Pupils: Pupils are equal, round, and reactive to light.  Neck:     Thyroid: No thyromegaly.     Vascular: No JVD.     Trachea: No tracheal deviation.  Cardiovascular:     Rate and Rhythm: Normal rate and regular rhythm.     Heart sounds: Normal heart sounds. No murmur heard. No friction rub. No gallop.   Pulmonary:     Effort: Pulmonary effort is normal. No respiratory distress.     Breath sounds: Normal breath sounds. No stridor. No wheezing or rales.  Chest:     Chest wall: No tenderness.  Breasts:     Right: Normal. No mass.     Left: Normal. No mass.    Abdominal:     General: Bowel sounds are normal. There is no distension.     Palpations: Abdomen is soft. There is no mass.     Tenderness: There is no abdominal tenderness. There is no guarding or rebound.  Genitourinary:    Exam position: Lithotomy position.     Cervix: Friability present.  Musculoskeletal:        General: No tenderness or deformity. Normal range of motion.     Cervical back: Normal range of motion and neck supple.  Lymphadenopathy:      Cervical: No cervical adenopathy.  Skin:    General: Skin is warm and dry.     Coloration: Skin is not pale.     Findings: Rash present. No erythema.     Comments: Pink rash on R forearm, non vesicular  Neurological:     Mental Status: She is alert.     Cranial Nerves: No cranial nerve deficit.     Motor: No abnormal muscle tone.     Coordination: Coordination normal.     Deep Tendon Reflexes: Reflexes are normal and symmetric.  Psychiatric:  Behavior: Behavior normal.        Thought Content: Thought content normal.        Judgment: Judgment normal.      LABS: Recent Results (from the past 2160 hour(s))  T4, free     Status: None   Collection Time: 06/27/20  1:20 PM  Result Value Ref Range   Free T4 0.96 0.82 - 1.77 ng/dL  TSH     Status: None   Collection Time: 06/27/20  1:20 PM  Result Value Ref Range   TSH 1.090 0.450 - 4.500 uIU/mL  T3     Status: None   Collection Time: 06/27/20  1:20 PM  Result Value Ref Range   T3, Total 111 71 - 180 ng/dL  Thyroglobulin antibody     Status: None   Collection Time: 06/27/20  1:20 PM  Result Value Ref Range   Thyroglobulin Antibody <1.0 0.0 - 0.9 IU/mL    Comment: Thyroglobulin Antibody measured by Beckman Coulter Methodology  Thyroid peroxidase antibody     Status: None   Collection Time: 06/27/20  1:20 PM  Result Value Ref Range   Thyroperoxidase Ab SerPl-aCnc 8 0 - 34 IU/mL  POCT Urinalysis Dipstick     Status: Abnormal   Collection Time: 07/16/20  3:34 PM  Result Value Ref Range   Color, UA     Clarity, UA     Glucose, UA Negative Negative   Bilirubin, UA Negative    Ketones, UA Negative    Spec Grav, UA 1.010 1.010 - 1.025   Blood, UA large    pH, UA 5.0 5.0 - 8.0   Protein, UA Positive (A) Negative    Comment: trace   Urobilinogen, UA 0.2 0.2 or 1.0 E.U./dL   Nitrite, UA negative    Leukocytes, UA Moderate (2+) (A) Negative   Appearance     Odor    CULTURE, URINE COMPREHENSIVE     Status: None    Collection Time: 07/16/20  3:36 PM   Specimen: Urine   Urine  Result Value Ref Range   Urine Culture, Comprehensive Final report    Organism ID, Bacteria Comment     Comment: Mixed urogenital flora 10,000-25,000 colony forming units per mL   UA/M w/rflx Culture, Routine     Status: Abnormal   Collection Time: 08/05/20  9:43 AM   Specimen: Urine   Urine  Result Value Ref Range   Specific Gravity, UA 1.023 1.005 - 1.030   pH, UA 6.5 5.0 - 7.5   Color, UA Yellow Yellow   Appearance Ur Clear Clear   Leukocytes,UA Negative Negative   Protein,UA Trace Negative/Trace   Glucose, UA Negative Negative   Ketones, UA Negative Negative   RBC, UA 3+ (A) Negative   Bilirubin, UA Negative Negative   Urobilinogen, Ur 1.0 0.2 - 1.0 mg/dL   Nitrite, UA Negative Negative   Microscopic Examination See below:     Comment: Microscopic was indicated and was performed.   Urinalysis Reflex Comment     Comment: This specimen will not reflex to a Urine Culture.  Microscopic Examination     Status: Abnormal   Collection Time: 08/05/20  9:43 AM   Urine  Result Value Ref Range   WBC, UA None seen 0 - 5 /hpf   RBC 0-2 0 - 2 /hpf   Epithelial Cells (non renal) >10 (A) 0 - 10 /hpf   Casts None seen None seen /lpf   Bacteria, UA Few None seen/Few  Assessment/Plan: 1. Menopausal and female climacteric states Continue estradiol patch and take 1/2 tab prempro daily. Once done with prempro will consider transition to low dose progesterone capsule later on (50mg  po qd)  2. Rash Monitor closely. May use cortisone cream if itchy or bothersome.  3. Benign thyroid cyst Will need monitoring q1-2 years  4. Routine cervical smear - IGP, Aptima HPV  5. Screening for STDs (sexually transmitted diseases) - NuSwab Vaginitis Plus (VG+)  6. Screening for colorectal cancer - Ambulatory referral to Gastroenterology  7. Chronic fatigue Doing well on cymbalta, needs refill today - DULoxetine (CYMBALTA)  30 MG capsule; Take 1 capsule (30 mg total) by mouth daily.  Dispense: 90 capsule; Refill: 1  8. Dysuria - UA/M w/rflx Culture, Routine - Microscopic Examination    General Counseling: khalila miska understanding of the findings of todays visit and agrees with plan of treatment. I have discussed any further diagnostic evaluation that may be needed or ordered today. We also reviewed her medications today. she has been encouraged to call the office with any questions or concerns that should arise related to todays visit.    Counseling:    Orders Placed This Encounter  Procedures  . Microscopic Examination  . UA/M w/rflx Culture, Routine  . NuSwab Vaginitis Plus (VG+)  . Ambulatory referral to Gastroenterology    Meds ordered this encounter  Medications  . DULoxetine (CYMBALTA) 30 MG capsule    Sig: Take 1 capsule (30 mg total) by mouth daily.    Dispense:  90 capsule    Refill:  1    This patient was seen by Drema Dallas, PA-C in collaboration with Dr. Clayborn Bigness as a part of collaborative care agreement.  Total time spent:40 Minutes  Time spent includes review of chart, medications, test results, and follow up plan with the patient.     Lavera Guise, MD  Internal Medicine

## 2020-08-06 LAB — MICROSCOPIC EXAMINATION
Casts: NONE SEEN /lpf
Epithelial Cells (non renal): >10 /hpf — AB (ref 0–10)
WBC, UA: NONE SEEN /hpf (ref 0–5)

## 2020-08-06 LAB — UA/M W/RFLX CULTURE, ROUTINE
Bilirubin, UA: NEGATIVE
Glucose, UA: NEGATIVE
Ketones, UA: NEGATIVE
Leukocytes,UA: NEGATIVE
Nitrite, UA: NEGATIVE
Specific Gravity, UA: 1.023 (ref 1.005–1.030)
Urobilinogen, Ur: 1 mg/dL (ref 0.2–1.0)
pH, UA: 6.5 (ref 5.0–7.5)

## 2020-08-07 LAB — NUSWAB VAGINITIS PLUS (VG+)
Candida albicans, NAA: NEGATIVE
Candida glabrata, NAA: NEGATIVE
Chlamydia trachomatis, NAA: NEGATIVE
Neisseria gonorrhoeae, NAA: NEGATIVE
Trich vag by NAA: NEGATIVE

## 2020-08-08 LAB — IGP, APTIMA HPV: HPV Aptima: NEGATIVE

## 2020-08-09 ENCOUNTER — Ambulatory Visit (HOSPITAL_COMMUNITY)
Admission: RE | Admit: 2020-08-09 | Discharge: 2020-08-09 | Disposition: A | Discharge status: Home / Self Care | Payer: Medicare HMO | Source: Ambulatory Visit | Attending: Neurology | Admitting: Neurology

## 2020-08-09 ENCOUNTER — Other Ambulatory Visit: Payer: Self-pay

## 2020-08-09 DIAGNOSIS — G3184 Mild cognitive impairment, so stated: Secondary | ICD-10-CM | POA: Insufficient documentation

## 2020-08-12 ENCOUNTER — Telehealth (INDEPENDENT_AMBULATORY_CARE_PROVIDER_SITE_OTHER): Payer: Medicare HMO | Admitting: Gastroenterology

## 2020-08-12 DIAGNOSIS — K219 Gastro-esophageal reflux disease without esophagitis: Secondary | ICD-10-CM

## 2020-08-12 DIAGNOSIS — Z1211 Encounter for screening for malignant neoplasm of colon: Secondary | ICD-10-CM | POA: Diagnosis not present

## 2020-08-12 NOTE — Progress Notes (Signed)
Jonathon Bellows , MD [address]  [place]  [place], [place] [address]  Main: [phone]  Fax: [phone]   Primary Care Physician: Lavera Guise, MD  Virtual Visit via Video Note  I connected with patient on 08/12/20 at  2:00 PM EDT by video and verified that I am speaking with the correct person using two identifiers.   I discussed the limitations, risks, security and privacy concerns of performing an evaluation and management service by video  and the availability of in person appointments. I also discussed with the patient that there may be a patient responsible charge related to this service. The patient expressed understanding and agreed to proceed.  Location of Patient: Home Location of Provider: Home Persons involved: Patient and provider only   History of Present Illness:   GERD and colon cancer screening follow up    HPI: [name] [name] is a 65 y.o. female   Summary of history :  Initially referred and seen on 06/10/2020 for GERD and colon cancer screening, her last colonoscopy was 7 to 8 years back.  No polyps noted.  No family history of colon cancer or polyps.  No change in bowel habits, rectal bleeding, change in shape of her stool, unintentional weight loss.     She has a history of GERD for which she has been taking Protonix 40 mg once a day with resolution of all symptoms which were regurgitation prior to starting on a PPI.  She had not tried dropping the dose in the past.     Interval history 06/10/2020-08/12/2020   She is doing very well on Protonix 20 mg once a day first thing in the morning on an empty stomach.  While on it she has absolutely no symptoms.  She states that she her last colonoscopy was 10 years back and is ready to schedule       Current Outpatient Medications  Medication Sig Dispense Refill  . aspirin EC 81 MG tablet Take 81 mg by mouth daily.    . Clobetasol Propionate 0.05 % shampoo Use as directed twice weekly prn 118  mL 5  . DULoxetine (CYMBALTA) 30 MG capsule Take 1 capsule (30 mg total) by mouth daily. 90 capsule 1  . estradiol (VIVELLE-DOT) 0.0375 MG/24HR Place 1 patch onto the skin 2 (two) times a week. 8 patch 12  . fluticasone (FLONASE) 50 MCG/ACT nasal spray Place 2 sprays into both nostrils as needed. 16 mL 2  . memantine (NAMENDA) 10 MG tablet Take 10 mg by mouth 2 (two) times daily.    . mirabegron ER (MYRBETRIQ) 50 MG TB24 tablet Take 1 tablet (50 mg total) by mouth daily. 30 tablet 3  . pantoprazole (PROTONIX) 40 MG tablet Take 1 tablet (40 mg total) by mouth daily. (Patient taking differently: Take 20 mg by mouth daily.) 90 tablet 1  . triamcinolone lotion (KENALOG) 0.1 % Apply topically 2 (two) times daily. 60 mL 1  . VITAMIN E PO Take 450 mg by mouth daily.     No current facility-administered medications for this visit.    Allergies as of 08/12/2020 - Review Complete 08/05/2020  Allergen Reaction Noted  . Amoxicillin Nausea Only 09/06/2012  . Azithromycin Nausea Only and Other (See Comments) 12/11/2013  . Ciprofloxacin Nausea Only 04/13/2017  . Dicyclomine Nausea Only and Nausea And Vomiting 09/06/2012  . Other Nausea Only 09/06/2012  . Oxybutynin Nausea Only and Nausea And Vomiting 09/06/2012  . Penicillins Nausea And Vomiting  and Nausea Only 09/06/2012  . Phenazopyridine Nausea Only 11/08/2017    Review of Systems:    All systems reviewed and negative except where noted in HPI.  General Appearance:    Alert, cooperative, no distress, appears stated age  Head:    Normocephalic, without obvious abnormality, atraumatic  Eyes:    PERRL, conjunctiva/corneas clear,  Ears:    Grossly normal hearing    Neurologic:  Grossly normal    Observations/Objective:  Labs: CMP     Component Value Date/Time   NA 138 06/27/2019 1307   K 4.4 06/27/2019 1307   CL 103 06/27/2019 1307   CO2 24 06/27/2019 1307   GLUCOSE 80 06/27/2019 1307   BUN 12 06/27/2019 1307   CREATININE 0.77  06/27/2019 1307   CALCIUM 9.6 06/27/2019 1307   PROT 7.0 06/27/2019 1307   ALBUMIN 4.5 06/27/2019 1307   AST 18 06/27/2019 1307   ALT 13 06/27/2019 1307   ALKPHOS 87 06/27/2019 1307   BILITOT 0.7 06/27/2019 1307   GFRNONAA 81 06/27/2019 1307   GFRAA 94 06/27/2019 1307   Lab Results  Component Value Date   WBC 5.9 12/08/2019   HGB 11.6 12/08/2019   HCT 36.1 12/08/2019   MCV 95 12/08/2019   PLT 260 12/08/2019    Imaging Studies: MR BRAIN WO CONTRAST  Result Date: 08/10/2020 CLINICAL DATA:  Mild cognitive impairment. EXAM: MRI HEAD WITHOUT CONTRAST TECHNIQUE: Multiplanar, multiecho pulse sequences of the brain and surrounding structures were obtained without intravenous contrast. Additionally, using NeuroQuant software a 3D volumetric analysis of the brain was performed and is compared to a normative database adjusted for age, gender and intracranial volume. COMPARISON:  Brain MRI 09/28/2011. FINDINGS: Brain: Mild multifocal T2/FLAIR hyperintensity within the cerebral white matter, nonspecific but compatible with chronic small vessel ischemic disease. There is no acute infarct. No evidence of intracranial mass. No chronic intracranial blood products. No extra-axial fluid collection. No midline shift. Vascular: Expected proximal arterial flow voids. Non dominant intracranial left vertebral artery. Skull and upper cervical spine: No focal marrow lesion. Sinuses/Orbits: Visualized orbits show no acute finding. Trace ethmoid sinus mucosal thickening. NeuroQuant Findings: Volumetric analysis of the brain was performed, with a fully detailed report in [facility]. Briefly, the comparison with age and gender matched reference reveals a whole brain volume normative percentile of 4. IMPRESSION: No evidence of acute intracranial abnormality. Mild cerebral white matter chronic small vessel ischemic disease, progressed from the brain MRI of 09/28/2011. NeuroQuant volumetric analysis of the brain, see  details on [employer]. Briefly, the comparison with age and gender matched reference reveals a whole brain volume normative percentile of 23. Minimal ethmoid sinus mucosal thickening. Electronically Signed   By: Kellie Simmering DO   On: 08/10/2020 12:02    Assessment and Plan:   [name] [name] is a 65 y.o. y/o female here to follow-up for GERD and colon cancer screening  Plan 1.  GERD : Continue lifestyle changes which she is adhering to.  Stop Protonix as she is having no symptoms while on it and plan to change to famotidine 20 mg twice daily.  She has been given instructions if the famotidine works well for her at twice daily dosing can drop it to once a day at nighttime.  Aim of this is to change her to the safest medication to be taken long-term.    2.  Colon cancer screening screening colonoscopy: Ready to be scheduled  I have discussed alternative options, risks & benefits,  which include, but are not limited to, bleeding, infection, perforation,respiratory complication & drug reaction.  The patient agrees with this plan & written consent will be obtained.         I discussed the assessment and treatment plan with the patient. The patient was provided an opportunity to ask questions and all were answered. The patient agreed with the plan and demonstrated an understanding of the instructions.   The patient was advised to call back or seek an in-person evaluation if the symptoms worsen or if the condition fails to improve as anticipated.  I provided 20 minutes of face-to-face time during this encounter.  Dr Jonathon Bellows MD,MRCP [facility]) Gastroenterology/Hepatology Pager: [phone]   Speech recognition software was used to dictate this note.

## 2020-08-27 ENCOUNTER — Telehealth: Payer: Self-pay | Admitting: Pharmacist

## 2020-08-27 DIAGNOSIS — K219 Gastro-esophageal reflux disease without esophagitis: Secondary | ICD-10-CM | POA: Diagnosis not present

## 2020-08-27 DIAGNOSIS — E038 Other specified hypothyroidism: Secondary | ICD-10-CM | POA: Diagnosis not present

## 2020-08-27 DIAGNOSIS — J309 Allergic rhinitis, unspecified: Secondary | ICD-10-CM | POA: Diagnosis not present

## 2020-08-27 NOTE — Progress Notes (Addendum)
    Chronic Care Management Pharmacy Assistant   Name: [name] [name]  MRN: [id number] DOB: [birth year]  Reason for Encounter: Adherence Review  Recent office visits:  08/05/20 Mylinda Latina PA-C. For follow-up. No medication changes.   Recent consult visits:  08/12/20 Joellyn Rued, MD. For follow-up. No medication changes.   Hospital visits:  None since 07/31/20  Medications: Outpatient Encounter Medications as of 08/27/2020  Medication Sig   aspirin EC 81 MG tablet Take 81 mg by mouth daily.   Clobetasol Propionate 0.05 % shampoo Use as directed twice weekly prn   DULoxetine (CYMBALTA) 30 MG capsule Take 1 capsule (30 mg total) by mouth daily.   estradiol (VIVELLE-DOT) 0.0375 MG/24HR Place 1 patch onto the skin 2 (two) times a week.   fluticasone (FLONASE) 50 MCG/ACT nasal spray Place 2 sprays into both nostrils as needed.   memantine (NAMENDA) 10 MG tablet Take 10 mg by mouth 2 (two) times daily.   mirabegron ER (MYRBETRIQ) 50 MG TB24 tablet Take 1 tablet (50 mg total) by mouth daily.   pantoprazole (PROTONIX) 40 MG tablet Take 1 tablet (40 mg total) by mouth daily. (Patient taking differently: Take 20 mg by mouth daily.)   triamcinolone lotion (KENALOG) 0.1 % Apply topically 2 (two) times daily.   VITAMIN E PO Take 450 mg by mouth daily.   No facility-administered encounter medications on file as of 08/27/2020.   Reviewed the patients chart for any medical/health changes an/or medication changes there were not any at this time, documented above.   Follow-Up:Pharmacist Review   Charlann Lange, [place] Pharmacist Assistant [phone]

## 2020-08-28 ENCOUNTER — Other Ambulatory Visit: Payer: Self-pay

## 2020-08-28 DIAGNOSIS — Z1211 Encounter for screening for malignant neoplasm of colon: Secondary | ICD-10-CM

## 2020-08-28 MED ORDER — NA SULFATE-K SULFATE-MG SULF 17.5-3.13-1.6 GM/177ML PO SOLN: 1.0000 | Freq: Once | ORAL | 0 refills | Status: AC

## 2020-08-31 ENCOUNTER — Other Ambulatory Visit: Payer: Self-pay | Admitting: Internal Medicine

## 2020-09-09 ENCOUNTER — Other Ambulatory Visit: Payer: Self-pay | Admitting: Internal Medicine

## 2020-09-09 DIAGNOSIS — L409 Psoriasis, unspecified: Secondary | ICD-10-CM

## 2020-09-27 ENCOUNTER — Telehealth: Payer: Medicare HMO

## 2020-10-02 ENCOUNTER — Encounter: Payer: Self-pay | Admitting: Gastroenterology

## 2020-10-03 ENCOUNTER — Ambulatory Visit: Payer: Medicare HMO | Admitting: Anesthesiology

## 2020-10-03 ENCOUNTER — Encounter
Admission: RE | Disposition: A | Discharge status: Home / Self Care | Payer: Self-pay | Source: Home / Self Care | Attending: Gastroenterology

## 2020-10-03 ENCOUNTER — Ambulatory Visit
Admission: RE | Admit: 2020-10-03 | Discharge: 2020-10-03 | Disposition: A | Discharge status: Home / Self Care | Payer: Medicare HMO | Attending: Gastroenterology | Admitting: Gastroenterology

## 2020-10-03 ENCOUNTER — Encounter: Payer: Self-pay | Admitting: Gastroenterology

## 2020-10-03 DIAGNOSIS — Z880 Allergy status to penicillin: Secondary | ICD-10-CM | POA: Diagnosis not present

## 2020-10-03 DIAGNOSIS — K635 Polyp of colon: Secondary | ICD-10-CM | POA: Diagnosis not present

## 2020-10-03 DIAGNOSIS — K640 First degree hemorrhoids: Secondary | ICD-10-CM | POA: Diagnosis not present

## 2020-10-03 DIAGNOSIS — D120 Benign neoplasm of cecum: Secondary | ICD-10-CM | POA: Diagnosis not present

## 2020-10-03 DIAGNOSIS — Z7982 Long term (current) use of aspirin: Secondary | ICD-10-CM | POA: Insufficient documentation

## 2020-10-03 DIAGNOSIS — Z79899 Other long term (current) drug therapy: Secondary | ICD-10-CM | POA: Insufficient documentation

## 2020-10-03 DIAGNOSIS — Z888 Allergy status to other drugs, medicaments and biological substances status: Secondary | ICD-10-CM | POA: Insufficient documentation

## 2020-10-03 DIAGNOSIS — Z79890 Hormone replacement therapy (postmenopausal): Secondary | ICD-10-CM | POA: Insufficient documentation

## 2020-10-03 DIAGNOSIS — Z881 Allergy status to other antibiotic agents status: Secondary | ICD-10-CM | POA: Insufficient documentation

## 2020-10-03 DIAGNOSIS — Z1211 Encounter for screening for malignant neoplasm of colon: Secondary | ICD-10-CM | POA: Diagnosis not present

## 2020-10-03 DIAGNOSIS — K649 Unspecified hemorrhoids: Secondary | ICD-10-CM | POA: Diagnosis not present

## 2020-10-03 HISTORY — PX: COLONOSCOPY WITH PROPOFOL: SHX5780

## 2020-10-03 SURGERY — COLONOSCOPY WITH PROPOFOL
Anesthesia: General

## 2020-10-03 MED ORDER — PROPOFOL 500 MG/50ML IV EMUL
INTRAVENOUS | Status: AC
  Filled 2020-10-03: qty 50

## 2020-10-03 MED ORDER — SODIUM CHLORIDE 0.9 % IV SOLN: INTRAVENOUS | Status: DC

## 2020-10-03 MED ORDER — PROPOFOL 500 MG/50ML IV EMUL
INTRAVENOUS | Status: DC | PRN
  Administered 2020-10-03: 120 ug/kg/min via INTRAVENOUS

## 2020-10-03 NOTE — Anesthesia Preprocedure Evaluation (Signed)
Anesthesia Evaluation  Patient identified by MRN, date of birth, ID band Patient awake    Reviewed: Allergy & Precautions, H&P , NPO status , Patient's Chart, lab work & pertinent test results, reviewed documented beta blocker date and time   History of Anesthesia Complications Negative for: history of anesthetic complications  Airway Mallampati: II  TM Distance: >3 FB Neck ROM: full    Dental  (+) Dental Advidsory Given, Caps, Teeth Intact Permanent retainer on the top:   Pulmonary neg pulmonary ROS,    Pulmonary exam normal breath sounds clear to auscultation       Cardiovascular Exercise Tolerance: Good hypertension, negative cardio ROS Normal cardiovascular exam Rhythm:regular Rate:Normal     Neuro/Psych negative neurological ROS  negative psych ROS   GI/Hepatic Neg liver ROS, GERD  ,  Endo/Other  negative endocrine ROS  Renal/GU negative Renal ROS  negative genitourinary   Musculoskeletal   Abdominal   Peds  Hematology negative hematology ROS (+)   Anesthesia Other Findings Past Medical History: No date: Allergic rhinitis No date: Depression No date: GERD (gastroesophageal reflux disease) No date: Hypertension No date: Postmenopausal disorder   Reproductive/Obstetrics negative OB ROS                             Anesthesia Physical Anesthesia Plan  ASA: 2  Anesthesia Plan: General   Post-op Pain Management:    Induction: Intravenous  PONV Risk Score and Plan: 2 and TIVA and Propofol infusion  Airway Management Planned: Natural Airway and Nasal Cannula  Additional Equipment:   Intra-op Plan:   Post-operative Plan:   Informed Consent: I have reviewed the patients History and Physical, chart, labs and discussed the procedure including the risks, benefits and alternatives for the proposed anesthesia with the patient or authorized representative who has indicated  his/her understanding and acceptance.     Dental Advisory Given  Plan Discussed with: Anesthesiologist, CRNA and Surgeon  Anesthesia Plan Comments:         Anesthesia Quick Evaluation

## 2020-10-03 NOTE — Anesthesia Procedure Notes (Signed)
Date/Time: 10/03/2020 8:27 AM Performed by: Vaughan Sine Pre-anesthesia Checklist: Patient identified, Emergency Drugs available, Suction available, Patient being monitored and Timeout performed Patient Re-evaluated:Patient Re-evaluated prior to induction Oxygen Delivery Method: Nasal cannula Preoxygenation: Pre-oxygenation with 100% oxygen Induction Type: IV induction Placement Confirmation: positive ETCO2 and CO2 detector

## 2020-10-03 NOTE — Op Note (Signed)
[facility] Gastroenterology Patient Name: [name] [name] Procedure Date: 10/03/2020 8:20 AM MRN: [id number] Account #: [id number] Date of Birth: Apr 19, 1955 Admit Type: Outpatient Age: 65 Room: [facility] ENDO ROOM 3 Gender: Female Note Status: Finalized Procedure:             Colonoscopy Indications:           Screening for colorectal malignant neoplasm Providers:             Jonathon Bellows MD, MD Referring MD:          Lavera Guise, MD (Referring MD) Medicines:             Monitored Anesthesia Care Complications:         No immediate complications. Procedure:             Pre-Anesthesia Assessment:                        - Prior to the procedure, a History and Physical was                         performed, and patient medications, allergies and                         sensitivities were reviewed. The patient's tolerance                         of previous anesthesia was reviewed.                        - The risks and benefits of the procedure and the                         sedation options and risks were discussed with the                         patient. All questions were answered and informed                         consent was obtained.                        - ASA Grade Assessment: II - A patient with mild                         systemic disease.                        After obtaining informed consent, the colonoscope was                         passed under direct vision. Throughout the procedure,                         the patient's blood pressure, pulse, and oxygen                         saturations were monitored continuously. The                         Colonoscope was introduced through the anus  and                         advanced to the the cecum, identified by the                         appendiceal orifice. The colonoscopy was performed                         with ease. The patient tolerated the procedure well.                         The quality of  the bowel preparation was good. Findings:      The perianal and digital rectal examinations were normal.      Non-bleeding internal hemorrhoids were found during retroflexion. The       hemorrhoids were large and Grade I (internal hemorrhoids that do not       prolapse).      A 10 mm polyp was found in the cecum. The polyp was sessile. The polyp       was removed with a cold snare. Resection and retrieval were complete.      A 5 mm polyp was found in the sigmoid colon. The polyp was sessile. The       polyp was removed with a cold snare. Resection and retrieval were       complete.      The exam was otherwise without abnormality on direct and retroflexion       views. Impression:            - Non-bleeding internal hemorrhoids.                        - One 10 mm polyp in the cecum, removed with a cold                         snare. Resected and retrieved.                        - One 5 mm polyp in the sigmoid colon, removed with a                         cold snare. Resected and retrieved.                        - The examination was otherwise normal on direct and                         retroflexion views. Recommendation:        - Discharge patient to home (with escort).                        - Resume previous diet.                        - Continue present medications.                        - Await pathology results.                        -  Repeat colonoscopy for surveillance based on                         pathology results. Procedure Code(s):     --- Professional ---                        [id number], Colonoscopy, flexible; with removal of                         tumor(s), polyp(s), or other lesion(s) by snare                         technique Diagnosis Code(s):     --- Professional ---                        K63.5, Polyp of colon                        Z12.11, Encounter for screening for malignant neoplasm                         of colon                        K64.0, First  degree hemorrhoids CPT copyright 2019 American Medical Association. All rights reserved. The codes documented in this report are preliminary and upon coder review may  be revised to meet current compliance requirements. Jonathon Bellows, MD Jonathon Bellows MD, MD 10/03/2020 8:48:07 AM This report has been signed electronically. Number of Addenda: 0 Note Initiated On: 10/03/2020 8:20 AM Scope Withdrawal Time: 0 hours 11 minutes 57 seconds  Total Procedure Duration: 0 hours 21 minutes 48 seconds  Estimated Blood Loss:  Estimated blood loss: none.      [facility]

## 2020-10-03 NOTE — Anesthesia Postprocedure Evaluation (Signed)
Anesthesia Post Note  Patient: [name] [name]  Procedure(s) Performed: COLONOSCOPY WITH PROPOFOL  Patient location during evaluation: Endoscopy Anesthesia Type: General Level of consciousness: awake and alert Pain management: pain level controlled Vital Signs Assessment: post-procedure vital signs reviewed and stable Respiratory status: spontaneous breathing, nonlabored ventilation, respiratory function stable and patient connected to nasal cannula oxygen Cardiovascular status: blood pressure returned to baseline and stable Postop Assessment: no apparent nausea or vomiting Anesthetic complications: no   No notable events documented.   Last Vitals:  Vitals:   10/03/20 0918 10/03/20 0919  BP: (!) 168/80 (!) 168/80  Pulse: (!) 57 (!) 55  Resp: 17 12  Temp:    SpO2: 100% 100%    Last Pain:  Vitals:   10/03/20 0919  TempSrc:   PainSc: 0-No pain                 Martha Clan

## 2020-10-03 NOTE — H&P (Signed)
Jonathon Bellows, MD [address], [place], [place], [place], [address] [address], [place], [place], [place], [address] Phone: [phone]  Fax: [phone]  Primary Care Physician:  Lavera Guise, MD   Pre-Procedure History & Physical: HPI:  [name] [name] is a 65 y.o. female is here for an colonoscopy.   Past Medical History:  Diagnosis Date   Allergic rhinitis    Depression    GERD (gastroesophageal reflux disease)    Hypertension    Postmenopausal disorder     Past Surgical History:  Procedure Laterality Date   BREAST BIOPSY Right 2016   cor bx   BENIGN BREAST LOBULES AND FIBROADIPOSE TISSUE    ruptured disc  1992    Prior to Admission medications   Medication Sig Start Date End Date Taking? Authorizing Provider  aspirin EC 81 MG tablet Take 81 mg by mouth daily.   Yes [provider]  Clobetasol Propionate 0.05 % shampoo Use as directed twice weekly prn 06/29/19  Yes Boscia, Heather E, NP  DULoxetine (CYMBALTA) 30 MG capsule Take 1 capsule (30 mg total) by mouth daily. 08/05/20  Yes McDonough, Lauren K, PA-C  estradiol (VIVELLE-DOT) 0.0375 MG/24HR Place 1 patch onto the skin 2 (two) times a week. 02/08/20  Yes Lavera Guise, MD  fluticasone [facility]) 50 MCG/ACT nasal spray Place 2 sprays into both nostrils as needed. 02/28/20  Yes Lavera Guise, MD  memantine (NAMENDA) 10 MG tablet Take 10 mg by mouth 2 (two) times daily.   Yes [provider]  MYRBETRIQ 50 MG TB24 tablet TAKE 1 TABLET BY MOUTH EVERY DAY 09/02/20  Yes Lavera Guise, MD  pantoprazole (PROTONIX) 40 MG tablet Take 1 tablet (40 mg total) by mouth daily. Patient taking differently: Take 20 mg by mouth daily. 04/29/20  Yes Lavera Guise, MD  triamcinolone lotion (KENALOG) 0.1 % APPLY TO AFFECTED AREA TWICE A DAY 09/09/20  Yes Lavera Guise, MD  VITAMIN E PO Take 450 mg by mouth daily.   Yes [provider]    Allergies as of 08/28/2020 - Review Complete 08/05/2020  Allergen  Reaction Noted   Amoxicillin Nausea Only 09/06/2012   Azithromycin Nausea Only and Other (See Comments) 12/11/2013   Ciprofloxacin Nausea Only 04/13/2017   Dicyclomine Nausea Only and Nausea And Vomiting 09/06/2012   Other Nausea Only 09/06/2012   Oxybutynin Nausea Only and Nausea And Vomiting 09/06/2012   Penicillins Nausea And Vomiting and Nausea Only 09/06/2012   Phenazopyridine Nausea Only 11/08/2017    Family History  Problem Relation Age of Onset   Asthma Mother    Diabetes Mother    Hypertension Mother    Stroke Mother    Epilepsy Mother    Coronary artery disease Mother    Breast cancer Neg Hx     Social History   Socioeconomic History   Marital status: Married    Spouse name: Not on file   Number of children: Not on file   Years of education: Not on file   Highest education level: Not on file  Occupational History   Not on file  Tobacco Use   Smoking status: Never   Smokeless tobacco: Never  Vaping Use   Vaping Use: Never used  Substance and Sexual Activity   Alcohol use: Yes    Comment: holidays, very rarely   Drug use: Never   Sexual activity: Not on file  Other Topics Concern   Not on file  Social History Narrative   Not on file   Social Determinants of Health   Financial Resource Strain: Low Risk    Difficulty of Paying Living Expenses: Not very hard  Food Insecurity: Not on file  Transportation Needs: Not on file  Physical Activity: Not on file  Stress: Not on file  Social Connections: Not on file  Intimate Partner Violence: Not on file    Review of Systems: See HPI, otherwise negative ROS  Physical Exam: BP 138/90   Pulse 75   Temp (!) 96.1 F (35.6 C) (Temporal)   Resp 17   Ht 5\' 5"  (1.651 m)   Wt 65.8 kg   LMP 08/09/2014   SpO2 100%   BMI 24.13 kg/m  General:   Alert,  pleasant and cooperative in NAD Head:  Normocephalic and atraumatic. Neck:  Supple; no masses or thyromegaly. Lungs:  Clear throughout to auscultation,  normal respiratory effort.    Heart:  +S1, +S2, Regular rate and rhythm, No edema. Abdomen:  Soft, nontender and nondistended. Normal bowel sounds, without guarding, and without rebound.   Neurologic:  Alert and  oriented x4;  grossly normal neurologically.  Impression/Plan: ICEL CASTLES is here for an colonoscopy to be performed for Screening colonoscopy average risk   Risks, benefits, limitations, and alternatives regarding  colonoscopy have been reviewed with the patient.  Questions have been answered.  All parties agreeable.   Jonathon Bellows, MD  10/03/2020, 8:12 AM

## 2020-10-03 NOTE — Transfer of Care (Signed)
Immediate Anesthesia Transfer of Care Note  Patient: [name] [name]  Procedure(s) Performed: COLONOSCOPY WITH PROPOFOL  Patient Location: PACU  Anesthesia Type:General  Level of Consciousness: awake and sedated  Airway & Oxygen Therapy: Patient Spontanous Breathing and Patient connected to nasal cannula oxygen  Post-op Assessment: Report given to RN and Post -op Vital signs reviewed and stable  Post vital signs: Reviewed and stable  Last Vitals:  Vitals Value Taken Time  BP    Temp    Pulse    Resp    SpO2      Last Pain:  Vitals:   10/03/20 0744  TempSrc: Temporal  PainSc: 0-No pain         Complications: No notable events documented.

## 2020-10-04 ENCOUNTER — Encounter: Payer: Self-pay | Admitting: Gastroenterology

## 2020-10-04 LAB — SURGICAL PATHOLOGY

## 2020-10-08 ENCOUNTER — Encounter: Payer: Self-pay | Admitting: Gastroenterology

## 2020-10-10 ENCOUNTER — Other Ambulatory Visit: Payer: Self-pay | Admitting: Internal Medicine

## 2020-10-10 DIAGNOSIS — Z1231 Encounter for screening mammogram for malignant neoplasm of breast: Secondary | ICD-10-CM

## 2020-10-24 ENCOUNTER — Telehealth: Payer: Self-pay

## 2020-10-25 ENCOUNTER — Other Ambulatory Visit: Payer: Self-pay | Admitting: Physician Assistant

## 2020-10-25 DIAGNOSIS — N3281 Overactive bladder: Secondary | ICD-10-CM

## 2020-10-25 MED ORDER — SOLIFENACIN SUCCINATE 5 MG PO TABS: 5.0000 mg | ORAL_TABLET | Freq: Every day | ORAL | 1 refills | Status: DC

## 2020-10-25 NOTE — Telephone Encounter (Signed)
Pt notified that we change med to vesicare

## 2020-11-12 ENCOUNTER — Other Ambulatory Visit: Payer: Self-pay

## 2020-11-12 ENCOUNTER — Ambulatory Visit
Admission: RE | Admit: 2020-11-12 | Discharge: 2020-11-12 | Disposition: A | Discharge status: Home / Self Care | Payer: Medicare HMO | Source: Ambulatory Visit | Attending: Internal Medicine | Admitting: Internal Medicine

## 2020-11-12 DIAGNOSIS — Z1231 Encounter for screening mammogram for malignant neoplasm of breast: Secondary | ICD-10-CM | POA: Diagnosis not present

## 2020-12-03 ENCOUNTER — Ambulatory Visit: Payer: Medicare HMO | Admitting: Physician Assistant

## 2020-12-05 ENCOUNTER — Ambulatory Visit: Payer: Medicare HMO | Admitting: Physician Assistant

## 2020-12-09 ENCOUNTER — Ambulatory Visit: Payer: Medicare HMO | Admitting: Physician Assistant

## 2020-12-16 ENCOUNTER — Ambulatory Visit: Payer: Medicare HMO | Admitting: Physician Assistant

## 2020-12-18 ENCOUNTER — Telehealth: Payer: Self-pay | Admitting: Pharmacist

## 2020-12-18 NOTE — Progress Notes (Signed)
    Chronic Care Management Pharmacy Assistant   Name: [name] [name]  MRN: [id number] DOB: 07-13-1955  Reason for Encounter: General Disease State Call   Conditions to be addressed/monitored: Allergic rhinitis, GERD, Hypothyroidism, Insomnia, Depression  Recent office visits:  12/19/20 Mylinda Latina, PA-C. For follow-up. No medication changes.   Recent consult visits:  None since 08/27/20  Hospital visits:  10/03/20 [facility] (2 Hours) Jonathon Bellows, MD. For Colonoscopy with Propofol.   Medications: Outpatient Encounter Medications as of 12/18/2020  Medication Sig   aspirin EC 81 MG tablet Take 81 mg by mouth daily.   Clobetasol Propionate 0.05 % shampoo Use as directed twice weekly prn   DULoxetine (CYMBALTA) 30 MG capsule Take 1 capsule (30 mg total) by mouth daily.   estradiol (VIVELLE-DOT) 0.0375 MG/24HR Place 1 patch onto the skin 2 (two) times a week.   fluticasone (FLONASE) 50 MCG/ACT nasal spray Place 2 sprays into both nostrils as needed.   memantine (NAMENDA) 10 MG tablet Take 10 mg by mouth 2 (two) times daily.   pantoprazole (PROTONIX) 40 MG tablet Take 1 tablet (40 mg total) by mouth daily.   solifenacin (VESICARE) 5 MG tablet Take 1 tablet (5 mg total) by mouth daily.   triamcinolone lotion (KENALOG) 0.1 % APPLY TO AFFECTED AREA TWICE A DAY   VITAMIN E PO Take 450 mg by mouth daily.   No facility-administered encounter medications on file as of 12/18/2020.   GEN CALL: Patient stated she does not have any questions or concerns about her medications at this time. She stated she seen Dr. Humphrey Rolls today and went over her health in great detail. She stated she is active on a daily basics. She stated she has a good appetite and drinks enough fluids.   Care Gaps:None.   Star Rating Drugs:N/A.  Follow-Up:Pharmacist Review  Charlann Lange, [place] Pharmacist Assistant [phone]

## 2020-12-19 ENCOUNTER — Encounter: Payer: Self-pay | Admitting: Physician Assistant

## 2020-12-19 ENCOUNTER — Ambulatory Visit (INDEPENDENT_AMBULATORY_CARE_PROVIDER_SITE_OTHER): Payer: Medicare HMO | Admitting: Physician Assistant

## 2020-12-19 ENCOUNTER — Other Ambulatory Visit: Payer: Self-pay

## 2020-12-19 DIAGNOSIS — N3281 Overactive bladder: Secondary | ICD-10-CM

## 2020-12-19 DIAGNOSIS — G4733 Obstructive sleep apnea (adult) (pediatric): Secondary | ICD-10-CM | POA: Diagnosis not present

## 2020-12-19 DIAGNOSIS — N951 Menopausal and female climacteric states: Secondary | ICD-10-CM

## 2020-12-19 NOTE — Progress Notes (Signed)
[facility] [place], [place] [address]  Internal MEDICINE  Office Visit Note  Patient Name: [name] [name]  [id number]  [id number]  Date of Service: 12/25/2020  Chief Complaint  Patient presents with   Follow-up   Depression   Hypertension    HPI Pt is here for routine follow up -She has been wearing Estradiol patch twice per week and 1/2 tab prempro Am and PM and no longer spotting. Denies any hot flashes. -Sleeping well, but Sr. Manuella Ghazi ordered sleep study and showed mild OSA with overall AHI of 8, but severe in REM. They are supposed to come out to show her supplies in the next few weeks.  -Does feel some fatigue -Vesicare is working well after switching from [employer] due to cost -Colonoscopy done, and advised to repeat in 3 years?  Current Medication: Outpatient Encounter Medications as of 12/19/2020  Medication Sig   aspirin EC 81 MG tablet Take 81 mg by mouth daily.   DULoxetine (CYMBALTA) 30 MG capsule Take 1 capsule (30 mg total) by mouth daily.   estradiol (VIVELLE-DOT) 0.0375 MG/24HR Place 1 patch onto the skin 2 (two) times a week.   estrogen, conjugated,-medroxyprogesterone (PREMPRO) 0.625-5 MG tablet Take 1 tablet by mouth daily.   fluticasone (FLONASE) 50 MCG/ACT nasal spray Place 2 sprays into both nostrils as needed.   memantine (NAMENDA) 10 MG tablet Take 10 mg by mouth 2 (two) times daily.   pantoprazole (PROTONIX) 40 MG tablet Take 1 tablet (40 mg total) by mouth daily.   solifenacin (VESICARE) 5 MG tablet Take 1 tablet (5 mg total) by mouth daily.   triamcinolone lotion (KENALOG) 0.1 % APPLY TO AFFECTED AREA TWICE A DAY   VITAMIN E PO Take 450 mg by mouth daily.   [DISCONTINUED] Clobetasol Propionate 0.05 % shampoo Use as directed twice weekly prn   No facility-administered encounter medications on file as of 12/19/2020.    Surgical History: Past Surgical History:  Procedure Laterality Date   BREAST BIOPSY Right 2016   cor bx    BENIGN BREAST LOBULES AND FIBROADIPOSE TISSUE    COLONOSCOPY WITH PROPOFOL N/A 10/03/2020   Procedure: COLONOSCOPY WITH PROPOFOL;  Surgeon: Jonathon Bellows, MD;  Location: [facility] ENDOSCOPY;  Service: Gastroenterology;  Laterality: N/A;   ruptured disc  1992    Medical History: Past Medical History:  Diagnosis Date   Allergic rhinitis    Depression    GERD (gastroesophageal reflux disease)    Hypertension    Postmenopausal disorder     Family History: Family History  Problem Relation Age of Onset   Asthma Mother    Diabetes Mother    Hypertension Mother    Stroke Mother    Epilepsy Mother    Coronary artery disease Mother    Breast cancer Neg Hx     Social History   Socioeconomic History   Marital status: Married    Spouse name: Not on file   Number of children: Not on file   Years of education: Not on file   Highest education level: Not on file  Occupational History   Not on file  Tobacco Use   Smoking status: Never   Smokeless tobacco: Never  Vaping Use   Vaping Use: Never used  Substance and Sexual Activity   Alcohol use: Yes    Comment: holidays, very rarely   Drug use: Never   Sexual activity: Not on file  Other Topics Concern   Not on file  Social  History Narrative   Not on file   Social Determinants of Health   Financial Resource Strain: Low Risk    Difficulty of Paying Living Expenses: Not very hard  Food Insecurity: Not on file  Transportation Needs: Not on file  Physical Activity: Not on file  Stress: Not on file  Social Connections: Not on file  Intimate Partner Violence: Not on file      Review of Systems  Constitutional:  Positive for fatigue. Negative for chills and unexpected weight change.  HENT:  Negative for congestion, postnasal drip, rhinorrhea, sneezing and sore throat.   Eyes:  Negative for redness.  Respiratory:  Negative for cough, chest tightness and shortness of breath.   Cardiovascular:  Negative for chest pain and  palpitations.  Gastrointestinal:  Negative for abdominal pain, constipation, diarrhea, nausea and vomiting.  Genitourinary:  Negative for dysuria and frequency.  Musculoskeletal:  Negative for arthralgias, back pain, joint swelling and neck pain.  Skin:  Negative for rash.  Neurological: Negative.  Negative for tremors and numbness.  Hematological:  Negative for adenopathy. Does not bruise/bleed easily.  Psychiatric/Behavioral:  Negative for behavioral problems (Depression), sleep disturbance and suicidal ideas. The patient is not nervous/anxious.    Vital Signs: BP 140/80   Pulse 76   Temp 97.8 F (36.6 C)   Resp 16   Ht 5\' 5"  (1.651 m)   Wt 148 lb (67.1 kg)   LMP 08/09/2014   SpO2 98%   BMI 24.63 kg/m    Physical Exam Vitals and nursing note reviewed.  Constitutional:      General: She is not in acute distress.    Appearance: She is well-developed and normal weight. She is not diaphoretic.  HENT:     Head: Normocephalic and atraumatic.     Mouth/Throat:     Pharynx: No oropharyngeal exudate.  Eyes:     Pupils: Pupils are equal, round, and reactive to light.  Neck:     Thyroid: No thyromegaly.     Vascular: No JVD.     Trachea: No tracheal deviation.  Cardiovascular:     Rate and Rhythm: Normal rate and regular rhythm.     Heart sounds: Normal heart sounds. No murmur heard.   No friction rub. No gallop.  Pulmonary:     Effort: Pulmonary effort is normal. No respiratory distress.     Breath sounds: No wheezing or rales.  Chest:     Chest wall: No tenderness.  Abdominal:     General: Bowel sounds are normal.     Palpations: Abdomen is soft.  Musculoskeletal:        General: Normal range of motion.     Cervical back: Normal range of motion and neck supple.  Lymphadenopathy:     Cervical: No cervical adenopathy.  Skin:    General: Skin is warm and dry.  Neurological:     Mental Status: She is alert and oriented to person, place, and time.     Cranial Nerves: No  cranial nerve deficit.  Psychiatric:        Behavior: Behavior normal.        Thought Content: Thought content normal.        Judgment: Judgment normal.       Assessment/Plan: 1. Menopausal and female climacteric states Stable, will continue current medications. May need to retry tapering prempro and change to progesterone tab in future  2. Overactive bladder Stable, continue vesicare  3. OSA (obstructive sleep apnea) Followed by Dr.  Manuella Ghazi, awaiting setup   General Counseling: Sandy Salaam understanding of the findings of todays visit and agrees with plan of treatment. I have discussed any further diagnostic evaluation that may be needed or ordered today. We also reviewed her medications today. she has been encouraged to call the office with any questions or concerns that should arise related to todays visit.    No orders of the defined types were placed in this encounter.   No orders of the defined types were placed in this encounter.   This patient was seen by Drema Dallas, PA-C in collaboration with Dr. Clayborn Bigness as a part of collaborative care agreement.   Total time spent:30 Minutes Time spent includes review of chart, medications, test results, and follow up plan with the patient.      Dr Lavera Guise Internal medicine

## 2020-12-20 ENCOUNTER — Other Ambulatory Visit: Payer: Self-pay

## 2020-12-20 DIAGNOSIS — L409 Psoriasis, unspecified: Secondary | ICD-10-CM

## 2020-12-20 MED ORDER — CLOBETASOL PROPIONATE 0.05 % EX SHAM: MEDICATED_SHAMPOO | CUTANEOUS | 5 refills | Status: DC

## 2020-12-26 ENCOUNTER — Ambulatory Visit (INDEPENDENT_AMBULATORY_CARE_PROVIDER_SITE_OTHER): Payer: Medicare HMO | Admitting: Internal Medicine

## 2020-12-26 ENCOUNTER — Encounter: Payer: Self-pay | Admitting: Internal Medicine

## 2020-12-26 ENCOUNTER — Other Ambulatory Visit: Payer: Self-pay

## 2020-12-26 VITALS — BP 142/80 | HR 69 | Temp 97.8°F | Resp 16 | Ht 64.0 in | Wt 150.0 lb

## 2020-12-26 DIAGNOSIS — N951 Menopausal and female climacteric states: Secondary | ICD-10-CM | POA: Diagnosis not present

## 2020-12-26 DIAGNOSIS — R5382 Chronic fatigue, unspecified: Secondary | ICD-10-CM

## 2020-12-26 DIAGNOSIS — N950 Postmenopausal bleeding: Secondary | ICD-10-CM

## 2020-12-26 DIAGNOSIS — R5383 Other fatigue: Secondary | ICD-10-CM | POA: Diagnosis not present

## 2020-12-26 DIAGNOSIS — I10 Essential (primary) hypertension: Secondary | ICD-10-CM

## 2020-12-26 DIAGNOSIS — G4733 Obstructive sleep apnea (adult) (pediatric): Secondary | ICD-10-CM | POA: Diagnosis not present

## 2020-12-26 MED ORDER — DULOXETINE HCL 30 MG PO CPEP: 30.0000 mg | ORAL_CAPSULE | Freq: Every day | ORAL | 1 refills | Status: DC

## 2020-12-26 MED ORDER — AMLODIPINE BESYLATE 2.5 MG PO TABS: 2.5000 mg | ORAL_TABLET | Freq: Every day | ORAL | 1 refills | Status: DC

## 2020-12-26 MED ORDER — CLONAZEPAM 0.5 MG PO TABS: ORAL_TABLET | ORAL | 1 refills | Status: DC

## 2020-12-26 NOTE — Progress Notes (Signed)
[facility] [place], [place] [address]  Internal MEDICINE  Office Visit Note  Patient Name: [name] [name]  [id number]  [id number]  Date of Service: 12/30/2020  Chief Complaint  Patient presents with   Acute Visit   Vaginal Bleeding    Last 3 days     HPI Pt is here with concerns of post menopausal bleeding. She has been on HRT, Has done well with vasomotor symptoms. Blood pressure has been elevated over the last few appointments    Current Medication: Outpatient Encounter Medications as of 12/26/2020  Medication Sig   amLODipine (NORVASC) 2.5 MG tablet Take 1 tablet (2.5 mg total) by mouth daily.   aspirin EC 81 MG tablet Take 81 mg by mouth daily.   Clobetasol Propionate 0.05 % shampoo Use as directed twice weekly prn   clonazePAM (KLONOPIN) 0.5 MG tablet Take one tab po qhs for hot flashes   fluticasone (FLONASE) 50 MCG/ACT nasal spray Place 2 sprays into both nostrils as needed.   memantine (NAMENDA) 10 MG tablet Take 10 mg by mouth 2 (two) times daily.   pantoprazole (PROTONIX) 40 MG tablet Take 1 tablet (40 mg total) by mouth daily.   solifenacin (VESICARE) 5 MG tablet Take 1 tablet (5 mg total) by mouth daily.   triamcinolone lotion (KENALOG) 0.1 % APPLY TO AFFECTED AREA TWICE A DAY   VITAMIN E PO Take 450 mg by mouth daily.   [DISCONTINUED] DULoxetine (CYMBALTA) 30 MG capsule Take 1 capsule (30 mg total) by mouth daily.   [DISCONTINUED] estradiol (VIVELLE-DOT) 0.0375 MG/24HR Place 1 patch onto the skin 2 (two) times a week.   [DISCONTINUED] estrogen, conjugated,-medroxyprogesterone (PREMPRO) 0.625-5 MG tablet 1/2 tab po daily   DULoxetine (CYMBALTA) 30 MG capsule Take 1 capsule (30 mg total) by mouth daily.   No facility-administered encounter medications on file as of 12/26/2020.    Surgical History: Past Surgical History:  Procedure Laterality Date   BREAST BIOPSY Right 2016   cor bx   BENIGN BREAST LOBULES AND FIBROADIPOSE TISSUE     COLONOSCOPY WITH PROPOFOL N/A 10/03/2020   Procedure: COLONOSCOPY WITH PROPOFOL;  Surgeon: [name] Bellows, MD;  Location: [facility] ENDOSCOPY;  Service: Gastroenterology;  Laterality: N/A;   ruptured disc  1992    Medical History: Past Medical History:  Diagnosis Date   Allergic rhinitis    Depression    GERD (gastroesophageal reflux disease)    Hypertension    Postmenopausal disorder     Family History: Family History  Problem Relation Age of Onset   Asthma Mother    Diabetes Mother    Hypertension Mother    Stroke Mother    Epilepsy Mother    Coronary artery disease Mother    Breast cancer Neg Hx     Social History   Socioeconomic History   Marital status: Married    Spouse name: Not on file   Number of children: Not on file   Years of education: Not on file   Highest education level: Not on file  Occupational History   Not on file  Tobacco Use   Smoking status: Never   Smokeless tobacco: Never  Vaping Use   Vaping Use: Never used  Substance and Sexual Activity   Alcohol use: Yes    Comment: holidays, very rarely   Drug use: Never   Sexual activity: Not on file  Other Topics Concern   Not on file  Social History Narrative   Not on file  Social Determinants of Health   Financial Resource Strain: Low Risk    Difficulty of Paying Living Expenses: Not very hard  Food Insecurity: Not on file  Transportation Needs: Not on file  Physical Activity: Not on file  Stress: Not on file  Social Connections: Not on file  Intimate Partner Violence: Not on file      Review of Systems  Constitutional:  Negative for fatigue and fever.  HENT:  Negative for congestion, mouth sores and postnasal drip.   Respiratory:  Negative for cough.   Cardiovascular:  Negative for chest pain.  Genitourinary:  Positive for vaginal bleeding. Negative for flank pain.  Musculoskeletal:  Positive for arthralgias.  Psychiatric/Behavioral: Negative.     Vital Signs: BP (!) 142/80   Pulse  69   Temp 97.8 F (36.6 C)   Resp 16   Ht 5\' 4"  (1.626 m)   Wt 150 lb (68 kg)   LMP 08/09/2014   SpO2 99%   BMI 25.75 kg/m    Physical Exam Constitutional:      Appearance: Normal appearance.  HENT:     Head: Normocephalic and atraumatic.     Nose: Nose normal.     Mouth/Throat:     Mouth: Mucous membranes are moist.     Pharynx: No posterior oropharyngeal erythema.  Eyes:     Extraocular Movements: Extraocular movements intact.     Pupils: Pupils are equal, round, and reactive to light.  Cardiovascular:     Pulses: Normal pulses.     Heart sounds: Normal heart sounds.  Pulmonary:     Effort: Pulmonary effort is normal.     Breath sounds: Normal breath sounds.  Abdominal:     Tenderness: There is abdominal tenderness.  Neurological:     General: No focal deficit present.     Mental Status: She is alert.  Psychiatric:        Mood and Affect: Mood normal.        Behavior: Behavior normal.       Assessment/Plan: 1. Postmenopausal bleeding DC HRT for now, monitor symptoms if continues to have bleeding will get pelvic U/S   2. Benign hypertension Elevated blood pressure reading for last few appointments. Start 2.5 mg once a day  - amLODipine (NORVASC) 2.5 MG tablet; Take 1 tablet (2.5 mg total) by mouth daily.  Dispense: 90 tablet; Refill: 1  3. Menopausal and female climacteric states DC HRT,  - clonazePAM (KLONOPIN) 0.5 MG tablet; Take one tab po qhs for hot flashes  Dispense: 30 tablet; Refill: 1  4. Other fatigue Continue Cymbalta as before  - DULoxetine (CYMBALTA) 30 MG capsule; Take 1 capsule (30 mg total) by mouth daily.  Dispense: 90 capsule; Refill: 1 dc   General Counseling: [name] [name] verbalizes understanding of the findings of todays visit and agrees with plan of treatment. I have discussed any further diagnostic evaluation that may be needed or ordered today. We also reviewed her medications today. she has been encouraged to call the office with any  questions or concerns that should arise related to todays visit.    No orders of the defined types were placed in this encounter.   Meds ordered this encounter  Medications   clonazePAM (KLONOPIN) 0.5 MG tablet    Sig: Take one tab po qhs for hot flashes    Dispense:  30 tablet    Refill:  1   amLODipine (NORVASC) 2.5 MG tablet    Sig: Take 1 tablet (2.5 mg  total) by mouth daily.    Dispense:  90 tablet    Refill:  1   DULoxetine (CYMBALTA) 30 MG capsule    Sig: Take 1 capsule (30 mg total) by mouth daily.    Dispense:  90 capsule    Refill:  1    Total time spent:35 Minutes Time spent includes review of chart, medications, test results, and follow up plan with the patient.   [place] Controlled Substance Database was reviewed by me.   Dr Lavera Guise Internal medicine

## 2020-12-27 DIAGNOSIS — E038 Other specified hypothyroidism: Secondary | ICD-10-CM | POA: Diagnosis not present

## 2020-12-27 DIAGNOSIS — J309 Allergic rhinitis, unspecified: Secondary | ICD-10-CM | POA: Diagnosis not present

## 2020-12-27 DIAGNOSIS — G4733 Obstructive sleep apnea (adult) (pediatric): Secondary | ICD-10-CM | POA: Diagnosis not present

## 2020-12-27 DIAGNOSIS — K219 Gastro-esophageal reflux disease without esophagitis: Secondary | ICD-10-CM | POA: Diagnosis not present

## 2020-12-30 DIAGNOSIS — Z008 Encounter for other general examination: Secondary | ICD-10-CM | POA: Diagnosis not present

## 2020-12-30 DIAGNOSIS — K219 Gastro-esophageal reflux disease without esophagitis: Secondary | ICD-10-CM | POA: Diagnosis not present

## 2020-12-30 DIAGNOSIS — G4733 Obstructive sleep apnea (adult) (pediatric): Secondary | ICD-10-CM | POA: Diagnosis not present

## 2020-12-30 DIAGNOSIS — R32 Unspecified urinary incontinence: Secondary | ICD-10-CM | POA: Diagnosis not present

## 2020-12-30 DIAGNOSIS — R69 Illness, unspecified: Secondary | ICD-10-CM | POA: Diagnosis not present

## 2020-12-30 DIAGNOSIS — F325 Major depressive disorder, single episode, in full remission: Secondary | ICD-10-CM | POA: Diagnosis not present

## 2020-12-30 DIAGNOSIS — Z79899 Other long term (current) drug therapy: Secondary | ICD-10-CM | POA: Diagnosis not present

## 2020-12-30 DIAGNOSIS — I10 Essential (primary) hypertension: Secondary | ICD-10-CM | POA: Diagnosis not present

## 2020-12-30 DIAGNOSIS — M329 Systemic lupus erythematosus, unspecified: Secondary | ICD-10-CM | POA: Diagnosis not present

## 2020-12-30 DIAGNOSIS — Z7722 Contact with and (suspected) exposure to environmental tobacco smoke (acute) (chronic): Secondary | ICD-10-CM | POA: Diagnosis not present

## 2020-12-30 DIAGNOSIS — J309 Allergic rhinitis, unspecified: Secondary | ICD-10-CM | POA: Diagnosis not present

## 2020-12-30 DIAGNOSIS — Z7982 Long term (current) use of aspirin: Secondary | ICD-10-CM | POA: Diagnosis not present

## 2020-12-30 DIAGNOSIS — G3184 Mild cognitive impairment, so stated: Secondary | ICD-10-CM | POA: Diagnosis not present

## 2020-12-30 DIAGNOSIS — Z809 Family history of malignant neoplasm, unspecified: Secondary | ICD-10-CM | POA: Diagnosis not present

## 2021-01-15 ENCOUNTER — Other Ambulatory Visit: Payer: Self-pay | Admitting: Internal Medicine

## 2021-01-15 ENCOUNTER — Telehealth: Payer: Self-pay

## 2021-01-15 NOTE — Telephone Encounter (Signed)
Lmom to pt that she is taking this med who prescribe her

## 2021-01-15 NOTE — Telephone Encounter (Signed)
Lmom to call us back that is taking namenda

## 2021-01-25 DIAGNOSIS — G4733 Obstructive sleep apnea (adult) (pediatric): Secondary | ICD-10-CM | POA: Diagnosis not present

## 2021-01-27 ENCOUNTER — Ambulatory Visit (INDEPENDENT_AMBULATORY_CARE_PROVIDER_SITE_OTHER): Payer: Medicare HMO | Admitting: Nurse Practitioner

## 2021-01-27 ENCOUNTER — Other Ambulatory Visit: Payer: Self-pay

## 2021-01-27 ENCOUNTER — Encounter: Payer: Self-pay | Admitting: Nurse Practitioner

## 2021-01-27 VITALS — BP 136/76 | HR 82 | Temp 98.6°F | Resp 16 | Ht 64.0 in | Wt 150.8 lb

## 2021-01-27 DIAGNOSIS — F5101 Primary insomnia: Secondary | ICD-10-CM | POA: Diagnosis not present

## 2021-01-27 DIAGNOSIS — N950 Postmenopausal bleeding: Secondary | ICD-10-CM

## 2021-01-27 DIAGNOSIS — R69 Illness, unspecified: Secondary | ICD-10-CM | POA: Diagnosis not present

## 2021-01-27 DIAGNOSIS — I10 Essential (primary) hypertension: Secondary | ICD-10-CM

## 2021-01-27 DIAGNOSIS — N951 Menopausal and female climacteric states: Secondary | ICD-10-CM

## 2021-01-27 MED ORDER — VENLAFAXINE HCL ER 37.5 MG PO CP24: 37.5000 mg | ORAL_CAPSULE | Freq: Every day | ORAL | 2 refills | Status: DC

## 2021-01-27 NOTE — Progress Notes (Signed)
[facility] [place], [place] [address]  Internal MEDICINE  Office Visit Note  Patient Name: [name] [name]  [id number]  [id number]  Date of Service: 01/27/2021  Chief Complaint  Patient presents with   Follow-up    Pt was given med for hot flashes, med caused cycle to start back up, pt has stopped med, today is a f/u since med was discontinued    Depression   Gastroesophageal Reflux   Hypertension    HPI [name] [name] presents for a follow up visit for menopausal symptoms and hypertension. She was previously taking estradiol to help with vasomotor symptoms of menopause, specifically hot flashes. This medication caused her cycle to start back up so she stopped taking it. This problem resolved since stopping the medication.  She is still having hot flashes and would like to try something different to help with them.  At her previous office visit with Dr. Humphrey Rolls, she was started on amlodipine for hypertension which has improved  her blood pressure.  She has taken clonazepam some which has not helped much with hot flashes but has helped with sleep and anxiety some.     Current Medication: Outpatient Encounter Medications as of 01/27/2021  Medication Sig   amLODipine (NORVASC) 2.5 MG tablet Take 1 tablet (2.5 mg total) by mouth daily.   aspirin EC 81 MG tablet Take 81 mg by mouth daily.   Clobetasol Propionate 0.05 % shampoo Use as directed twice weekly prn   fluticasone (FLONASE) 50 MCG/ACT nasal spray Place 2 sprays into both nostrils as needed.   memantine (NAMENDA) 10 MG tablet TAKE 1 TABLET BY MOUTH TWICE A DAY   pantoprazole (PROTONIX) 40 MG tablet Take 1 tablet (40 mg total) by mouth daily.   solifenacin (VESICARE) 5 MG tablet Take 1 tablet (5 mg total) by mouth daily.   triamcinolone lotion (KENALOG) 0.1 % APPLY TO AFFECTED AREA TWICE A DAY   VITAMIN E PO Take 450 mg by mouth daily.   [DISCONTINUED] clonazePAM (KLONOPIN) 0.5 MG tablet Take one tab po qhs for  hot flashes   [DISCONTINUED] DULoxetine (CYMBALTA) 30 MG capsule Take 1 capsule (30 mg total) by mouth daily.   [DISCONTINUED] venlafaxine XR (EFFEXOR-XR) 37.5 MG 24 hr capsule Take 1 capsule (37.5 mg total) by mouth daily with breakfast. (Patient not taking: Reported on 02/04/2021)   No facility-administered encounter medications on file as of 01/27/2021.    Surgical History: Past Surgical History:  Procedure Laterality Date   BREAST BIOPSY Right 2016   cor bx   BENIGN BREAST LOBULES AND FIBROADIPOSE TISSUE    COLONOSCOPY WITH PROPOFOL N/A 10/03/2020   Procedure: COLONOSCOPY WITH PROPOFOL;  Surgeon: Jonathon Bellows, MD;  Location: [facility] ENDOSCOPY;  Service: Gastroenterology;  Laterality: N/A;   ruptured disc  1992    Medical History: Past Medical History:  Diagnosis Date   Allergic rhinitis    Depression    GERD (gastroesophageal reflux disease)    Hypertension    Postmenopausal disorder     Family History: Family History  Problem Relation Age of Onset   Asthma Mother    Diabetes Mother    Hypertension Mother    Stroke Mother    Epilepsy Mother    Coronary artery disease Mother    Breast cancer Neg Hx     Social History   Socioeconomic History   Marital status: Married    Spouse name: Not on file   Number of children: Not on file  Years of education: Not on file   Highest education level: Not on file  Occupational History   Not on file  Tobacco Use   Smoking status: Never   Smokeless tobacco: Never  Vaping Use   Vaping Use: Never used  Substance and Sexual Activity   Alcohol use: Yes    Comment: holidays, very rarely   Drug use: Never   Sexual activity: Not on file  Other Topics Concern   Not on file  Social History Narrative   Not on file   Social Determinants of Health   Financial Resource Strain: Low Risk    Difficulty of Paying Living Expenses: Not very hard  Food Insecurity: Not on file  Transportation Needs: Not on file  Physical Activity: Not on  file  Stress: Not on file  Social Connections: Not on file  Intimate Partner Violence: Not on file      Review of Systems  Constitutional:  Negative for chills, fatigue and unexpected weight change.  HENT:  Negative for congestion, rhinorrhea, sneezing and sore throat.   Eyes:  Negative for redness.  Respiratory:  Negative for cough, chest tightness and shortness of breath.   Cardiovascular:  Negative for chest pain and palpitations.  Gastrointestinal:  Negative for abdominal pain, constipation, diarrhea, nausea and vomiting.  Genitourinary:  Negative for dysuria and frequency.  Musculoskeletal:  Negative for arthralgias, back pain, joint swelling and neck pain.  Skin:  Negative for rash.  Neurological: Negative.  Negative for tremors and numbness.  Hematological:  Negative for adenopathy. Does not bruise/bleed easily.  Psychiatric/Behavioral:  Negative for behavioral problems (Depression), sleep disturbance and suicidal ideas. The patient is not nervous/anxious.    Vital Signs: BP 136/76   Pulse 82   Temp 98.6 F (37 C)   Resp 16   Ht 5\' 4"  (1.626 m)   Wt 150 lb 12.8 oz (68.4 kg)   LMP 08/09/2014   SpO2 97%   BMI 25.88 kg/m    Physical Exam Constitutional:      General: She is not in acute distress.    Appearance: Normal appearance. She is normal weight. She is not ill-appearing.  HENT:     Head: Normocephalic and atraumatic.  Eyes:     Pupils: Pupils are equal, round, and reactive to light.  Cardiovascular:     Rate and Rhythm: Normal rate and regular rhythm.  Pulmonary:     Effort: Pulmonary effort is normal. No respiratory distress.  Neurological:     Mental Status: She is alert and oriented to person, place, and time.     Cranial Nerves: No cranial nerve deficit.     Coordination: Coordination normal.     Gait: Gait normal.  Psychiatric:        Mood and Affect: Mood normal.        Behavior: Behavior normal.       Assessment/Plan: 1. Vasomotor  symptoms due to menopause Start venlafaxine 37.5 mg daily to control hot flashes and night sweats.   2. Essential hypertension Well controlled with amlodipine, continue as prescribed.   3. Postmenopausal bleeding Resolved after stopping estradiol  4. Primary insomnia Continue clonazepam as prescribed.    General Counseling: adriyana greenbaum understanding of the findings of todays visit and agrees with plan of treatment. I have discussed any further diagnostic evaluation that may be needed or ordered today. We also reviewed her medications today. she has been encouraged to call the office with any questions or concerns that should arise  related to todays visit.    No orders of the defined types were placed in this encounter.   Meds ordered this encounter  Medications   DISCONTD: venlafaxine XR (EFFEXOR-XR) 37.5 MG 24 hr capsule    Sig: Take 1 capsule (37.5 mg total) by mouth daily with breakfast.    Dispense:  30 capsule    Refill:  2    Discontinue duloxetine, patient to start on venlafaxine    Return in about 4 weeks (around 02/24/2021) for F/U, eval new med, [facility] PCP.   Total time spent:30 Minutes Time spent includes review of chart, medications, test results, and follow up plan with the patient.   [place] Controlled Substance Database was reviewed by me.  This patient was seen by Jonetta Osgood, FNP-C in collaboration with Dr. Clayborn Bigness as a part of collaborative care agreement.   Averill Pons R. Valetta Fuller, MSN, FNP-C Internal medicine

## 2021-02-03 ENCOUNTER — Telehealth: Payer: Self-pay

## 2021-02-03 NOTE — Telephone Encounter (Signed)
Pt advised I made appt tomorrow to discuss med due to pt don't like to take Effexor

## 2021-02-03 NOTE — Telephone Encounter (Signed)
Please advise pt all medicine as side effects, this is good medicine, if she can try for 6 weeks and see if it works then we can switch then

## 2021-02-04 ENCOUNTER — Ambulatory Visit (INDEPENDENT_AMBULATORY_CARE_PROVIDER_SITE_OTHER): Payer: Medicare HMO | Admitting: Internal Medicine

## 2021-02-04 ENCOUNTER — Other Ambulatory Visit: Payer: Self-pay

## 2021-02-04 ENCOUNTER — Encounter: Payer: Self-pay | Admitting: Internal Medicine

## 2021-02-04 VITALS — BP 124/74 | HR 80 | Temp 98.2°F | Resp 16 | Ht 64.0 in | Wt 149.4 lb

## 2021-02-04 DIAGNOSIS — N950 Postmenopausal bleeding: Secondary | ICD-10-CM

## 2021-02-04 DIAGNOSIS — N951 Menopausal and female climacteric states: Secondary | ICD-10-CM | POA: Diagnosis not present

## 2021-02-04 DIAGNOSIS — I10 Essential (primary) hypertension: Secondary | ICD-10-CM

## 2021-02-04 MED ORDER — CLONAZEPAM 0.5 MG PO TABS: ORAL_TABLET | ORAL | 2 refills | Status: DC

## 2021-02-04 MED ORDER — SERTRALINE HCL 50 MG PO TABS: 50.0000 mg | ORAL_TABLET | Freq: Every day | ORAL | 3 refills | Status: DC

## 2021-02-04 NOTE — Progress Notes (Signed)
[facility] [place],  [address]  Internal MEDICINE  Office Visit Note  Patient Name: [name] [name]  [id number]  [id number]  Date of Service: 03/02/2021  Chief Complaint  Patient presents with   Follow-up    Discuss meds, hot flashes, night sweats    Anxiety   Depression   Hypertension    HPI Patient is here for routine follow-up she has concerns about postmenopausal bleeding her HRT has been stopped.  She is really concerned about hot flashes and night sweats and difficulty sleeping at night Patient has not been responding well to nontraditional therapy for hot flashes like SSRI, she has been on Zoloft, Cymbalta and Effexor  Patient suffers from mild memory loss and has been seen with a neurologist in the past and takes Namenda on a regular basis   Current Medication: Outpatient Encounter Medications as of 02/04/2021  Medication Sig   amLODipine (NORVASC) 2.5 MG tablet Take 1 tablet (2.5 mg total) by mouth daily.   aspirin EC 81 MG tablet Take 81 mg by mouth daily.   Clobetasol Propionate 0.05 % shampoo Use as directed twice weekly prn   clonazePAM (KLONOPIN) 0.5 MG tablet Take one tab po qhs for hot flashes   fluticasone (FLONASE) 50 MCG/ACT nasal spray Place 2 sprays into both nostrils as needed.   memantine (NAMENDA) 10 MG tablet TAKE 1 TABLET BY MOUTH TWICE A DAY   pantoprazole (PROTONIX) 40 MG tablet Take 1 tablet (40 mg total) by mouth daily.   solifenacin (VESICARE) 5 MG tablet Take 1 tablet (5 mg total) by mouth daily.   triamcinolone lotion (KENALOG) 0.1 % APPLY TO AFFECTED AREA TWICE A DAY   VITAMIN E PO Take 450 mg by mouth daily.   [DISCONTINUED] clonazePAM (KLONOPIN) 0.5 MG tablet Take one tab po qhs for hot flashes   [DISCONTINUED] sertraline (ZOLOFT) 50 MG tablet Take 1 tablet (50 mg total) by mouth daily.   [DISCONTINUED] venlafaxine XR (EFFEXOR-XR) 37.5 MG 24 hr capsule Take 1 capsule (37.5 mg total) by mouth daily with  breakfast. (Patient not taking: Reported on 02/04/2021)   No facility-administered encounter medications on file as of 02/04/2021.    Surgical History: Past Surgical History:  Procedure Laterality Date   BREAST BIOPSY Right 2016   cor bx   BENIGN BREAST LOBULES AND FIBROADIPOSE TISSUE    COLONOSCOPY WITH PROPOFOL N/A 10/03/2020   Procedure: COLONOSCOPY WITH PROPOFOL;  Surgeon: Jonathon Bellows, MD;  Location: [facility] ENDOSCOPY;  Service: Gastroenterology;  Laterality: N/A;   ruptured disc  1992    Medical History: Past Medical History:  Diagnosis Date   Allergic rhinitis    Depression    GERD (gastroesophageal reflux disease)    Hypertension    Postmenopausal disorder     Family History: Family History  Problem Relation Age of Onset   Asthma Mother    Diabetes Mother    Hypertension Mother    Stroke Mother    Epilepsy Mother    Coronary artery disease Mother    Breast cancer Neg Hx     Social History   Socioeconomic History   Marital status: Married    Spouse name: Not on file   Number of children: Not on file   Years of education: Not on file   Highest education level: Not on file  Occupational History   Not on file  Tobacco Use   Smoking status: Never   Smokeless tobacco: Never  Vaping Use  Vaping Use: Never used  Substance and Sexual Activity   Alcohol use: Yes    Comment: holidays, very rarely   Drug use: Never   Sexual activity: Not on file  Other Topics Concern   Not on file  Social History Narrative   Not on file   Social Determinants of Health   Financial Resource Strain: Low Risk    Difficulty of Paying Living Expenses: Not very hard  Food Insecurity: Not on file  Transportation Needs: Not on file  Physical Activity: Not on file  Stress: Not on file  Social Connections: Not on file  Intimate Partner Violence: Not on file      Review of Systems  Constitutional:  Negative for fatigue and fever.  HENT:  Negative for congestion, mouth sores and  postnasal drip.   Respiratory:  Negative for cough.   Cardiovascular:  Negative for chest pain.  Gastrointestinal: Negative.   Genitourinary:  Positive for vaginal bleeding. Negative for flank pain.       Night sweats and hot flashes  Neurological:  Negative for weakness.  Psychiatric/Behavioral:  Positive for sleep disturbance.    Vital Signs: BP 124/74 Comment: 142/78  Pulse 80   Temp 98.2 F (36.8 C)   Resp 16   Ht 5\' 4"  (1.626 m)   Wt 149 lb 6.4 oz (67.8 kg)   LMP 08/09/2014   SpO2 99%   BMI 25.64 kg/m    Physical Exam Constitutional:      Appearance: Normal appearance.  HENT:     Head: Normocephalic and atraumatic.     Nose: Nose normal.     Mouth/Throat:     Mouth: Mucous membranes are moist.     Pharynx: No posterior oropharyngeal erythema.  Eyes:     Extraocular Movements: Extraocular movements intact.     Pupils: Pupils are equal, round, and reactive to light.  Cardiovascular:     Pulses: Normal pulses.     Heart sounds: Normal heart sounds.  Pulmonary:     Effort: Pulmonary effort is normal.     Breath sounds: Normal breath sounds.  Musculoskeletal:     Cervical back: Normal range of motion.  Neurological:     General: No focal deficit present.     Mental Status: She is alert.  Psychiatric:        Mood and Affect: Mood normal.        Behavior: Behavior normal.       Assessment/Plan: 1. Menopausal and female climacteric states Will discontinue also order hormone replacement therapy start Zoloft at lower dose as tolerated by the patient had chronic pain for insomnia and hot flashes at night as well - clonazePAM (KLONOPIN) 0.5 MG tablet; Take one tab po qhs for hot flashes  Dispense: 30 tablet; Refill: 2  2. Essential hypertension Blood pressure is under control with Norvasc 2.5 mg once a day we will continue to monitor  3. Postmenopausal bleeding Since cessation of therapy with HRT patient has not had any episodes of bleeding, we will get pelvic  ultrasound or uterine biopsy if it is continues to be a problem,she understands and agrees with the treatment plan  General Counseling: [name] [name] understanding of the findings of todays visit and agrees with plan of treatment. I have discussed any further diagnostic evaluation that may be needed or ordered today. We also reviewed her medications today. she has been encouraged to call the office with any questions or concerns that should arise related to todays visit.  No orders of the defined types were placed in this encounter.   Meds ordered this encounter  Medications   DISCONTD: sertraline (ZOLOFT) 50 MG tablet    Sig: Take 1 tablet (50 mg total) by mouth daily.    Dispense:  30 tablet    Refill:  3   clonazePAM (KLONOPIN) 0.5 MG tablet    Sig: Take one tab po qhs for hot flashes    Dispense:  30 tablet    Refill:  2    Total time spent:30 Minutes Time spent includes review of chart, medications, test results, and follow up plan with the patient.   [place] Controlled Substance Database was reviewed by me.   Dr Lavera Guise Internal medicine

## 2021-02-18 ENCOUNTER — Telehealth: Payer: Self-pay | Admitting: Student-PharmD

## 2021-02-18 NOTE — Telephone Encounter (Signed)
[name],[name]  [id number] 32 years, Female  DOB: [birth year]  M: [phone] General Review Call Chart Review What recent interventions/DTPs have been made by any provider to improve the patient's conditions in the last 3 months?:  12/19/20 McDonough, Si Gaul, PA-C For follow-up/Menopasusal. No medication changes. 12/26/20 Dr. Humphrey Rolls For postmenpasual bleeding. STARTED Amlodipine 2.5 mg daily and Clonazepam 0.5 mg 1 tablet PO QHS for hot flashes. STOPPED Estradiol and PREMPRO 01/27/21 Jonetta Osgood, NP. For follow-up. STARTED Venlafaxine 37.5 mg daily STOPPED Duloxetine.  02/04/21 Dr. Humphrey Rolls For menopasusal. STARTED Sertaline 50 mg daily. STOPPED Venlafaxine.  Any recent hospitalizations or ED visits since last visit with CPP?: No Adherence Review Adherence rates for STAR metric medications: N/A. Adherence rates for medications indicated for disease state being reviewed: N/A Does the patient have >5 day gap between last estimated fill dates for any of the above medications?: No Disease State Questions Able to connect with the Patient?: Yes Did patient have any problems with their health recently?: No Did patient have any problems with their pharmacy?: No Does patient have any issues or side effects with their medications?: No Additional  information to pass to Patient's CPP?: No Anything we can do to help take better care of Patient?: No Misc. Response/Information:: Patient stated her new mesopause medication is helping a lot , she stated now she is not completely wetting the bed at night from hot flashes. She stated she does get hot flashes sometimes but it is not as nearly as bad as it was before. She stated she does not have any questions or concerns about her medications at this time.  Engagement Notes McLemore, Veronica on 02/17/2021 01:19 PM HC Chart Review/Call: 30 min  Pharmacist Review Adherence gaps identified?: No Drug Therapy Problems identified?: No Assessment:  Controlled  10 minutes spent in review, coordination, and documentation.  Reviewed by: Alena Bills, PharmD Clinical Pharmacist [phone]

## 2021-02-23 ENCOUNTER — Encounter: Payer: Self-pay | Admitting: Nurse Practitioner

## 2021-02-24 ENCOUNTER — Ambulatory Visit: Payer: Medicare HMO | Admitting: Nurse Practitioner

## 2021-02-25 DIAGNOSIS — G4733 Obstructive sleep apnea (adult) (pediatric): Secondary | ICD-10-CM | POA: Diagnosis not present

## 2021-02-26 ENCOUNTER — Other Ambulatory Visit: Payer: Self-pay | Admitting: Internal Medicine

## 2021-02-26 DIAGNOSIS — E038 Other specified hypothyroidism: Secondary | ICD-10-CM | POA: Diagnosis not present

## 2021-02-26 DIAGNOSIS — J309 Allergic rhinitis, unspecified: Secondary | ICD-10-CM | POA: Diagnosis not present

## 2021-02-26 DIAGNOSIS — K219 Gastro-esophageal reflux disease without esophagitis: Secondary | ICD-10-CM | POA: Diagnosis not present

## 2021-03-04 ENCOUNTER — Ambulatory Visit: Payer: Medicare HMO | Admitting: Internal Medicine

## 2021-03-14 ENCOUNTER — Other Ambulatory Visit: Payer: Self-pay | Admitting: Internal Medicine

## 2021-03-14 DIAGNOSIS — J309 Allergic rhinitis, unspecified: Secondary | ICD-10-CM

## 2021-03-27 DIAGNOSIS — G4733 Obstructive sleep apnea (adult) (pediatric): Secondary | ICD-10-CM | POA: Diagnosis not present

## 2021-04-10 DIAGNOSIS — Z79899 Other long term (current) drug therapy: Secondary | ICD-10-CM | POA: Diagnosis not present

## 2021-04-10 DIAGNOSIS — R69 Illness, unspecified: Secondary | ICD-10-CM | POA: Diagnosis not present

## 2021-04-10 DIAGNOSIS — G3184 Mild cognitive impairment, so stated: Secondary | ICD-10-CM | POA: Diagnosis not present

## 2021-04-10 DIAGNOSIS — I10 Essential (primary) hypertension: Secondary | ICD-10-CM | POA: Diagnosis not present

## 2021-04-10 DIAGNOSIS — G4733 Obstructive sleep apnea (adult) (pediatric): Secondary | ICD-10-CM | POA: Diagnosis not present

## 2021-04-27 ENCOUNTER — Other Ambulatory Visit: Payer: Self-pay | Admitting: Internal Medicine

## 2021-04-27 DIAGNOSIS — N3281 Overactive bladder: Secondary | ICD-10-CM

## 2021-04-27 DIAGNOSIS — G4733 Obstructive sleep apnea (adult) (pediatric): Secondary | ICD-10-CM | POA: Diagnosis not present

## 2021-05-05 DIAGNOSIS — M542 Cervicalgia: Secondary | ICD-10-CM | POA: Diagnosis not present

## 2021-05-08 ENCOUNTER — Telehealth: Payer: Self-pay | Admitting: Student-PharmD

## 2021-05-08 NOTE — Progress Notes (Addendum)
General Review Call  [place]  [id number] 84 years, Female  DOB: 07-12-1955  M: [phone]  General Review [facility]) Completed by Charlann Lange on 05/08/2021  Chart Review What recent interventions/DTPs have been made by any provider to improve the patient's conditions in the last 3 months?:  Consults: 04/10/21 Geriatric Medicine Alisa Graff, [place]. For MCI (mild cognitive impairment) with memory loss. PER NOTE: Increase your blood pressure medication to Amlodipine 5 mg daily and STOP taking sertraline begin taking Mirtazipine 7.5 mg 30-60 minutes before bedtime.  Any recent hospitalizations or ED visits since last visit with CPP?: No  Adherence Review  Adherence rates for STAR metric medications: None.  Adherence rates for medications indicated for disease state being reviewed: None.  Does the patient have >5 day gap between last estimated fill dates for any of the above medications?: No  Disease State Questions Able to connect with the Patient?: Yes  Did patient have any problems with their health recently?: No  Did patient have any problems with their pharmacy?: No  Does patient have any issues or side effects with their medications?: No  Additional  information to pass to Patient's CPP?: No  Anything we can do to help take better care of Patient?: Yes  Note Patient's response: Patient stated she was cut back to 1/2 tablet on her acid reflux Omeprazole medication and she ahs been feeling some form of chest pain at night and wants to know if its because she cut her Omeprazole to only a half a tablet.  Engagement Notes Charlann Lange on 05/06/2021 04:19 PM [facility] Chart Review: 5 min 05/06/21 [facility] Assessment call time spent: 11 min 05/08/21 CP Review: 10 min 05/08/21  Charlann Lange, [place]  [phone]  Pharmacist Review Adherence gaps identified?: No Drug Therapy Problems identified?: No Assessment: Controlled  Alena Bills Clinical  Pharmacist

## 2021-05-12 DIAGNOSIS — G3184 Mild cognitive impairment, so stated: Secondary | ICD-10-CM | POA: Diagnosis not present

## 2021-05-12 DIAGNOSIS — R0683 Snoring: Secondary | ICD-10-CM | POA: Diagnosis not present

## 2021-05-12 DIAGNOSIS — M5412 Radiculopathy, cervical region: Secondary | ICD-10-CM | POA: Diagnosis not present

## 2021-05-12 DIAGNOSIS — R232 Flushing: Secondary | ICD-10-CM | POA: Diagnosis not present

## 2021-05-20 ENCOUNTER — Other Ambulatory Visit: Payer: Self-pay | Admitting: Nurse Practitioner

## 2021-05-20 DIAGNOSIS — N3281 Overactive bladder: Secondary | ICD-10-CM

## 2021-05-22 ENCOUNTER — Encounter: Payer: Self-pay | Admitting: Gastroenterology

## 2021-05-22 ENCOUNTER — Other Ambulatory Visit: Payer: Self-pay

## 2021-05-22 ENCOUNTER — Ambulatory Visit: Payer: Medicare HMO | Admitting: Gastroenterology

## 2021-05-27 DIAGNOSIS — E038 Other specified hypothyroidism: Secondary | ICD-10-CM

## 2021-05-27 DIAGNOSIS — K219 Gastro-esophageal reflux disease without esophagitis: Secondary | ICD-10-CM

## 2021-05-27 DIAGNOSIS — J309 Allergic rhinitis, unspecified: Secondary | ICD-10-CM

## 2021-05-27 DIAGNOSIS — G4733 Obstructive sleep apnea (adult) (pediatric): Secondary | ICD-10-CM | POA: Diagnosis not present

## 2021-06-03 ENCOUNTER — Other Ambulatory Visit: Payer: Self-pay | Admitting: Internal Medicine

## 2021-06-03 DIAGNOSIS — K219 Gastro-esophageal reflux disease without esophagitis: Secondary | ICD-10-CM

## 2021-06-14 ENCOUNTER — Other Ambulatory Visit: Payer: Self-pay | Admitting: Nurse Practitioner

## 2021-06-14 DIAGNOSIS — N3281 Overactive bladder: Secondary | ICD-10-CM

## 2021-06-16 ENCOUNTER — Ambulatory Visit: Payer: Medicare HMO | Admitting: Physician Assistant

## 2021-06-23 DIAGNOSIS — I69815 Cognitive social or emotional deficit following other cerebrovascular disease: Secondary | ICD-10-CM | POA: Diagnosis not present

## 2021-06-23 DIAGNOSIS — G3184 Mild cognitive impairment, so stated: Secondary | ICD-10-CM | POA: Diagnosis not present

## 2021-06-23 DIAGNOSIS — R413 Other amnesia: Secondary | ICD-10-CM | POA: Diagnosis not present

## 2021-06-25 DIAGNOSIS — G4733 Obstructive sleep apnea (adult) (pediatric): Secondary | ICD-10-CM | POA: Diagnosis not present

## 2021-07-02 ENCOUNTER — Other Ambulatory Visit: Payer: Self-pay | Admitting: Internal Medicine

## 2021-07-02 DIAGNOSIS — K219 Gastro-esophageal reflux disease without esophagitis: Secondary | ICD-10-CM

## 2021-07-03 DIAGNOSIS — I69815 Cognitive social or emotional deficit following other cerebrovascular disease: Secondary | ICD-10-CM | POA: Diagnosis not present

## 2021-07-08 ENCOUNTER — Other Ambulatory Visit: Payer: Self-pay | Admitting: Nurse Practitioner

## 2021-07-08 DIAGNOSIS — M542 Cervicalgia: Secondary | ICD-10-CM | POA: Diagnosis not present

## 2021-07-08 DIAGNOSIS — N3281 Overactive bladder: Secondary | ICD-10-CM

## 2021-07-11 ENCOUNTER — Other Ambulatory Visit: Payer: Self-pay | Admitting: Internal Medicine

## 2021-07-21 DIAGNOSIS — I69815 Cognitive social or emotional deficit following other cerebrovascular disease: Secondary | ICD-10-CM | POA: Diagnosis not present

## 2021-07-25 ENCOUNTER — Telehealth: Payer: Self-pay

## 2021-07-26 DIAGNOSIS — G4733 Obstructive sleep apnea (adult) (pediatric): Secondary | ICD-10-CM | POA: Diagnosis not present

## 2021-07-28 ENCOUNTER — Other Ambulatory Visit: Payer: Self-pay | Admitting: Nurse Practitioner

## 2021-07-28 MED ORDER — CLONAZEPAM 0.5 MG PO TABS: 0.5000 mg | ORAL_TABLET | Freq: Every day | ORAL | 0 refills | Status: DC

## 2021-07-28 NOTE — Telephone Encounter (Signed)
error 

## 2021-07-29 ENCOUNTER — Other Ambulatory Visit: Payer: Self-pay | Admitting: Internal Medicine

## 2021-07-29 DIAGNOSIS — K219 Gastro-esophageal reflux disease without esophagitis: Secondary | ICD-10-CM

## 2021-08-06 ENCOUNTER — Other Ambulatory Visit: Payer: Self-pay | Admitting: Internal Medicine

## 2021-08-07 DIAGNOSIS — G4733 Obstructive sleep apnea (adult) (pediatric): Secondary | ICD-10-CM | POA: Diagnosis not present

## 2021-08-07 DIAGNOSIS — G3184 Mild cognitive impairment, so stated: Secondary | ICD-10-CM | POA: Diagnosis not present

## 2021-08-07 DIAGNOSIS — I10 Essential (primary) hypertension: Secondary | ICD-10-CM | POA: Diagnosis not present

## 2021-08-07 DIAGNOSIS — Z9989 Dependence on other enabling machines and devices: Secondary | ICD-10-CM | POA: Diagnosis not present

## 2021-08-07 DIAGNOSIS — R69 Illness, unspecified: Secondary | ICD-10-CM | POA: Diagnosis not present

## 2021-08-11 ENCOUNTER — Ambulatory Visit: Payer: Medicare HMO | Admitting: Nurse Practitioner

## 2021-08-11 ENCOUNTER — Other Ambulatory Visit: Payer: Self-pay | Admitting: Internal Medicine

## 2021-08-11 DIAGNOSIS — I10 Essential (primary) hypertension: Secondary | ICD-10-CM

## 2021-08-18 ENCOUNTER — Telehealth: Payer: Self-pay

## 2021-08-18 NOTE — Telephone Encounter (Signed)
Left vm and sent mychart message to confirm 08/21/21 appointment-[name]

## 2021-08-21 ENCOUNTER — Other Ambulatory Visit: Payer: Self-pay | Admitting: Nurse Practitioner

## 2021-08-21 ENCOUNTER — Telehealth: Payer: Self-pay

## 2021-08-21 DIAGNOSIS — K219 Gastro-esophageal reflux disease without esophagitis: Secondary | ICD-10-CM

## 2021-08-21 NOTE — Telephone Encounter (Signed)
Left vm and sent mychart message to confirm 08/28/21 appointment-[name]

## 2021-08-22 ENCOUNTER — Ambulatory Visit: Payer: Medicare HMO | Admitting: Nurse Practitioner

## 2021-08-25 ENCOUNTER — Other Ambulatory Visit: Payer: Self-pay | Admitting: Internal Medicine

## 2021-08-25 ENCOUNTER — Other Ambulatory Visit: Payer: Self-pay | Admitting: Nurse Practitioner

## 2021-08-25 DIAGNOSIS — G4733 Obstructive sleep apnea (adult) (pediatric): Secondary | ICD-10-CM | POA: Diagnosis not present

## 2021-08-25 DIAGNOSIS — L409 Psoriasis, unspecified: Secondary | ICD-10-CM

## 2021-08-28 ENCOUNTER — Encounter: Payer: Self-pay | Admitting: Nurse Practitioner

## 2021-08-28 ENCOUNTER — Ambulatory Visit (INDEPENDENT_AMBULATORY_CARE_PROVIDER_SITE_OTHER): Payer: Medicare HMO | Admitting: Nurse Practitioner

## 2021-08-28 VITALS — BP 124/80 | HR 89 | Temp 97.9°F | Resp 16 | Ht 65.0 in | Wt 152.8 lb

## 2021-08-28 DIAGNOSIS — N3281 Overactive bladder: Secondary | ICD-10-CM | POA: Diagnosis not present

## 2021-08-28 DIAGNOSIS — L409 Psoriasis, unspecified: Secondary | ICD-10-CM

## 2021-08-28 DIAGNOSIS — N951 Menopausal and female climacteric states: Secondary | ICD-10-CM

## 2021-08-28 DIAGNOSIS — G3184 Mild cognitive impairment, so stated: Secondary | ICD-10-CM | POA: Diagnosis not present

## 2021-08-28 DIAGNOSIS — D649 Anemia, unspecified: Secondary | ICD-10-CM | POA: Diagnosis not present

## 2021-08-28 DIAGNOSIS — E782 Mixed hyperlipidemia: Secondary | ICD-10-CM | POA: Diagnosis not present

## 2021-08-28 DIAGNOSIS — Z0001 Encounter for general adult medical examination with abnormal findings: Secondary | ICD-10-CM

## 2021-08-28 DIAGNOSIS — K219 Gastro-esophageal reflux disease without esophagitis: Secondary | ICD-10-CM

## 2021-08-28 DIAGNOSIS — R300 Dysuria: Secondary | ICD-10-CM

## 2021-08-28 MED ORDER — TRIAMCINOLONE ACETONIDE 0.1 % EX LOTN: TOPICAL_LOTION | Freq: Every day | CUTANEOUS | 1 refills | Status: DC | PRN

## 2021-08-28 MED ORDER — AMLODIPINE BESYLATE 5 MG PO TABS: 5.0000 mg | ORAL_TABLET | Freq: Every day | ORAL | 3 refills | Status: DC

## 2021-08-28 MED ORDER — CLOBETASOL PROPIONATE 0.05 % EX SHAM: MEDICATED_SHAMPOO | CUTANEOUS | 5 refills | Status: DC

## 2021-08-28 MED ORDER — PANTOPRAZOLE SODIUM 40 MG PO TBEC: 40.0000 mg | DELAYED_RELEASE_TABLET | Freq: Every day | ORAL | 1 refills | Status: DC

## 2021-08-28 MED ORDER — MEMANTINE HCL 10 MG PO TABS: 10.0000 mg | ORAL_TABLET | Freq: Two times a day (BID) | ORAL | 3 refills | Status: DC

## 2021-08-28 MED ORDER — VENLAFAXINE HCL ER 37.5 MG PO CP24: 37.5000 mg | ORAL_CAPSULE | Freq: Every day | ORAL | 2 refills | Status: DC

## 2021-08-28 MED ORDER — CLONAZEPAM 0.5 MG PO TABS: 0.5000 mg | ORAL_TABLET | Freq: Every day | ORAL | 1 refills | Status: DC

## 2021-08-28 NOTE — Progress Notes (Signed)
[facility] [place], [place] [address]  Internal MEDICINE  Office Visit Note  Patient Name: [name] [name]  [id number]  [id number]  Date of Service: 08/28/2021  Chief Complaint  Patient presents with   Medicare Wellness   Depression   Hypertension    HPI Shanecia presents for an annual well visit and physical exam.  She is a well-appearing 66 year old female with hypertension, allergic rhinitis, GERD, hypothyroidism, lumbar radiculopathy, psoriasis, insomnia and depression.  She is due for a routine mammogram in August this year and she will be due for a routine colonoscopy in 2025 as she had one last year in July.  She had a bone density scan 11/17/2018. She is currently taking clonazepam for hot flashes, she is interested in trying something else for vasomotor symptoms of menopause. She is due for routine labs and medication refills. She has no other questions or concerns, and no new or worsening pain.     Current Medication: Outpatient Encounter Medications as of 08/28/2021  Medication Sig   aspirin 81 MG EC tablet Take 1 tablet by mouth daily.   Biotin 2.5 MG CAPS Take 1 capsule by mouth daily.   Cholecalciferol 25 MCG (1000 UT) tablet Take 1 tablet by mouth 1 day or 1 dose.   fluticasone (FLONASE) 50 MCG/ACT nasal spray PLACE 2 SPRAYS INTO BOTH NOSTRILS AS NEEDED.   Multiple Vitamins-Minerals (CENTRUM SILVER) tablet Take 1 tablet by mouth daily.   MYRBETRIQ 50 MG TB24 tablet Take 50 mg by mouth daily.   venlafaxine XR (EFFEXOR-XR) 37.5 MG 24 hr capsule Take 1 capsule (37.5 mg total) by mouth daily with breakfast.   [DISCONTINUED] amLODipine (NORVASC) 5 MG tablet Take 1 tablet by mouth daily.   [DISCONTINUED] Calcium Carb-Cholecalciferol 600-10 MG-MCG TABS Take 1 tablet by mouth in the morning and at bedtime.   [DISCONTINUED] Clobetasol Propionate 0.05 % shampoo Use as directed twice weekly prn   [DISCONTINUED] clonazePAM (KLONOPIN) 0.5 MG tablet Take 1  tablet (0.5 mg total) by mouth at bedtime.   [DISCONTINUED] memantine (NAMENDA) 10 MG tablet Take 1 tablet by mouth 2 (two) times daily.   [DISCONTINUED] mirtazapine (REMERON) 7.5 MG tablet Take 1 tablet by mouth at bedtime.   [DISCONTINUED] modafinil (PROVIGIL) 100 MG tablet Take 1 tablet by mouth daily.   [DISCONTINUED] pantoprazole (PROTONIX) 40 MG tablet TAKE 1 TABLET BY MOUTH EVERY DAY   [DISCONTINUED] sertraline (ZOLOFT) 50 MG tablet TAKE 1 TABLET BY MOUTH EVERY DAY   [DISCONTINUED] triamcinolone lotion (KENALOG) 0.1 % APPLY TO AFFECTED AREA TWICE A DAY   [DISCONTINUED] VITAMIN E PO Take 450 mg by mouth daily.   amLODipine (NORVASC) 5 MG tablet Take 1 tablet (5 mg total) by mouth daily.   Clobetasol Propionate 0.05 % shampoo Use as directed twice weekly prn   clonazePAM (KLONOPIN) 0.5 MG tablet Take 1 tablet (0.5 mg total) by mouth at bedtime.   memantine (NAMENDA) 10 MG tablet Take 1 tablet (10 mg total) by mouth 2 (two) times daily.   pantoprazole (PROTONIX) 40 MG tablet Take 1 tablet (40 mg total) by mouth daily.   triamcinolone lotion (KENALOG) 0.1 % Apply topically daily as needed.   [DISCONTINUED] Apoaequorin 10 MG CAPS Take 1 capsule by mouth daily. (Patient not taking: Reported on 08/28/2021)   [DISCONTINUED] solifenacin (VESICARE) 5 MG tablet TAKE 1 TABLET (5 MG TOTAL) BY MOUTH DAILY. (Patient not taking: Reported on 08/28/2021)   No facility-administered encounter medications on file as of 08/28/2021.  Surgical History: Past Surgical History:  Procedure Laterality Date   BREAST BIOPSY Right 2016   cor bx   BENIGN BREAST LOBULES AND FIBROADIPOSE TISSUE    COLONOSCOPY WITH PROPOFOL N/A 10/03/2020   Procedure: COLONOSCOPY WITH PROPOFOL;  Surgeon: Jonathon Bellows, MD;  Location: [facility] ENDOSCOPY;  Service: Gastroenterology;  Laterality: N/A;   ruptured disc  1992    Medical History: Past Medical History:  Diagnosis Date   Allergic rhinitis    Depression    GERD (gastroesophageal  reflux disease)    Hypertension    Postmenopausal disorder     Family History: Family History  Problem Relation Age of Onset   Asthma Mother    Diabetes Mother    Hypertension Mother    Stroke Mother    Epilepsy Mother    Coronary artery disease Mother    Breast cancer Neg Hx     Social History   Socioeconomic History   Marital status: Married    Spouse name: Not on file   Number of children: Not on file   Years of education: Not on file   Highest education level: Not on file  Occupational History   Not on file  Tobacco Use   Smoking status: Never   Smokeless tobacco: Never  Vaping Use   Vaping Use: Never used  Substance and Sexual Activity   Alcohol use: Yes    Comment: holidays, very rarely   Drug use: Never   Sexual activity: Not on file  Other Topics Concern   Not on file  Social History Narrative   Not on file   Social Determinants of Health   Financial Resource Strain: Not on file  Food Insecurity: Not on file  Transportation Needs: Not on file  Physical Activity: Not on file  Stress: Not on file  Social Connections: Not on file  Intimate Partner Violence: Not on file      Review of Systems  Constitutional:  Negative for activity change, appetite change, chills, fatigue, fever and unexpected weight change.  HENT: Negative.  Negative for congestion, ear pain, rhinorrhea, sore throat and trouble swallowing.   Eyes: Negative.   Respiratory: Negative.  Negative for cough, chest tightness, shortness of breath and wheezing.   Cardiovascular: Negative.  Negative for chest pain.  Gastrointestinal: Negative.  Negative for abdominal pain, blood in stool, constipation, diarrhea, nausea and vomiting.  Endocrine: Negative.   Genitourinary: Negative.  Negative for difficulty urinating, dysuria, frequency, hematuria and urgency.  Musculoskeletal: Negative.  Negative for arthralgias, back pain, joint swelling, myalgias and neck pain.  Skin: Negative.  Negative  for rash and wound.  Allergic/Immunologic: Negative.  Negative for immunocompromised state.  Neurological: Negative.  Negative for dizziness, seizures, numbness and headaches.  Hematological: Negative.   Psychiatric/Behavioral:  Positive for depression. Negative for behavioral problems, self-injury and suicidal ideas. The patient is not nervous/anxious.    Vital Signs: BP 124/80   Pulse 89   Temp 97.9 F (36.6 C)   Resp 16   Ht '5\' 5"'$  (1.651 m)   Wt 152 lb 12.8 oz (69.3 kg)   LMP 08/09/2014   SpO2 96%   BMI 25.43 kg/m    Physical Exam Vitals reviewed.  Constitutional:      General: She is awake. She is not in acute distress.    Appearance: Normal appearance. She is well-developed, well-groomed and normal weight. She is not ill-appearing or diaphoretic.  HENT:     Head: Normocephalic and atraumatic.     Right  Ear: Tympanic membrane, ear canal and external ear normal.     Left Ear: Tympanic membrane, ear canal and external ear normal.     Nose: Nose normal. No congestion or rhinorrhea.     Mouth/Throat:     Lips: Pink.     Mouth: Mucous membranes are moist.     Pharynx: Oropharynx is clear. Uvula midline. No oropharyngeal exudate or posterior oropharyngeal erythema.  Eyes:     General: Lids are normal. Vision grossly intact. Gaze aligned appropriately. No scleral icterus.       Right eye: No discharge.        Left eye: No discharge.     Extraocular Movements: Extraocular movements intact.     Conjunctiva/sclera: Conjunctivae normal.     Pupils: Pupils are equal, round, and reactive to light.     Right eye: Pupil is not sluggish.     Left eye: Pupil is not sluggish.  Neck:     Thyroid: No thyromegaly.     Vascular: No JVD.     Trachea: Trachea and phonation normal. No tracheal deviation.  Cardiovascular:     Rate and Rhythm: Normal rate and regular rhythm.     Pulses: Normal pulses.     Heart sounds: Normal heart sounds, S1 normal and S2 normal. No murmur heard.    No  friction rub. No gallop.  Pulmonary:     Effort: Pulmonary effort is normal. No accessory muscle usage or respiratory distress.     Breath sounds: Normal breath sounds and air entry. No stridor. No wheezing or rales.  Chest:     Chest wall: No tenderness.  Breasts:    Breasts are symmetrical.     Right: Normal. No swelling, bleeding, inverted nipple, mass, nipple discharge, skin change or tenderness.     Left: Normal. No swelling, bleeding, inverted nipple, mass, nipple discharge, skin change or tenderness.  Abdominal:     General: Bowel sounds are normal. There is no distension.     Palpations: Abdomen is soft. There is no shifting dullness, fluid wave, mass or pulsatile mass.     Tenderness: There is no abdominal tenderness. There is no guarding or rebound.  Musculoskeletal:        General: No tenderness or deformity. Normal range of motion.     Cervical back: Normal range of motion and neck supple.     Right lower leg: No edema.     Left lower leg: No edema.  Lymphadenopathy:     Cervical: No cervical adenopathy.     Upper Body:     Right upper body: No supraclavicular, axillary or pectoral adenopathy.     Left upper body: No supraclavicular, axillary or pectoral adenopathy.  Skin:    General: Skin is warm and dry.     Capillary Refill: Capillary refill takes less than 2 seconds.     Coloration: Skin is not pale.     Findings: No erythema or rash.  Neurological:     Mental Status: She is alert and oriented to person, place, and time.     Cranial Nerves: No cranial nerve deficit.     Motor: No abnormal muscle tone.     Coordination: Coordination normal.     Gait: Gait normal.     Deep Tendon Reflexes: Reflexes are normal and symmetric.  Psychiatric:        Mood and Affect: Mood normal.        Behavior: Behavior normal. Behavior is cooperative.  Thought Content: Thought content normal.        Judgment: Judgment normal.        Assessment/Plan: 1. Encounter for  general adult medical examination with abnormal findings Age-appropriate preventive screenings and vaccinations discussed, annual physical exam completed. Routine labs for health maintenance ordered, see below. PHM updated.   2. Vasomotor symptoms due to menopause Venlafaxine prescribed, follow up in 1 month. Takes clonazepam as well. Refills ordered.  - clonazePAM (KLONOPIN) 0.5 MG tablet; Take 1 tablet (0.5 mg total) by mouth at bedtime.  Dispense: 30 tablet; Refill: 1  3. Erythropenia Routine labs ordered.  - CBC with Differential/Platelet - Iron, TIBC and Ferritin Panel - Hepatic function panel  4. MCI (mild cognitive impairment) with memory loss Taking namenda, followed by neurology.   5. Psoriasis of scalp Refills ordered.  - Clobetasol Propionate 0.05 % shampoo; Use as directed twice weekly prn  Dispense: 118 mL; Refill: 5  6. Gastro-esophageal reflux disease without esophagitis Acid reflux is controlled with current medication  7. Mixed hyperlipidemia Routine lab ordered.  - Lipid Profile  8. Dysuria Routine urinalysis done - UA/M w/rflx Culture, Routine - Microscopic Examination - Urine Culture, Reflex  9. Overactive bladder Takes myrbetriq      General Counseling: Sandy Salaam understanding of the findings of todays visit and agrees with plan of treatment. I have discussed any further diagnostic evaluation that may be needed or ordered today. We also reviewed her medications today. she has been encouraged to call the office with any questions or concerns that should arise related to todays visit.    Orders Placed This Encounter  Procedures   UA/M w/rflx Culture, Routine   CBC with Differential/Platelet   Lipid Profile   Iron, TIBC and Ferritin Panel   Hepatic function panel    Meds ordered this encounter  Medications   triamcinolone lotion (KENALOG) 0.1 %    Sig: Apply topically daily as needed.    Dispense:  60 mL    Refill:  1   venlafaxine  XR (EFFEXOR-XR) 37.5 MG 24 hr capsule    Sig: Take 1 capsule (37.5 mg total) by mouth daily with breakfast.    Dispense:  30 capsule    Refill:  2   Clobetasol Propionate 0.05 % shampoo    Sig: Use as directed twice weekly prn    Dispense:  118 mL    Refill:  5   pantoprazole (PROTONIX) 40 MG tablet    Sig: Take 1 tablet (40 mg total) by mouth daily.    Dispense:  90 tablet    Refill:  1   clonazePAM (KLONOPIN) 0.5 MG tablet    Sig: Take 1 tablet (0.5 mg total) by mouth at bedtime.    Dispense:  30 tablet    Refill:  1   memantine (NAMENDA) 10 MG tablet    Sig: Take 1 tablet (10 mg total) by mouth 2 (two) times daily.    Dispense:  180 tablet    Refill:  3   amLODipine (NORVASC) 5 MG tablet    Sig: Take 1 tablet (5 mg total) by mouth daily.    Dispense:  90 tablet    Refill:  3    Return in about 6 months (around 02/27/2022) for F/U, Pegah Segel PCP.   Total time spent:30 Minutes Time spent includes review of chart, medications, test results, and follow up plan with the patient.   [place] Controlled Substance Database was reviewed by me.  This patient was  seen by Jonetta Osgood, FNP-C in collaboration with Dr. Clayborn Bigness as a part of collaborative care agreement.  Marzelle Rutten R. Valetta Fuller, MSN, FNP-C Internal medicine

## 2021-08-31 LAB — UA/M W/RFLX CULTURE, ROUTINE
Bilirubin, UA: NEGATIVE
Glucose, UA: NEGATIVE
Ketones, UA: NEGATIVE
Nitrite, UA: NEGATIVE
Protein,UA: NEGATIVE
RBC, UA: NEGATIVE
Specific Gravity, UA: 1.021 (ref 1.005–1.030)
Urobilinogen, Ur: 0.2 mg/dL (ref 0.2–1.0)
pH, UA: 7 (ref 5.0–7.5)

## 2021-08-31 LAB — URINE CULTURE, REFLEX

## 2021-08-31 LAB — MICROSCOPIC EXAMINATION
Bacteria, UA: NONE SEEN
Casts: NONE SEEN /lpf

## 2021-09-02 DIAGNOSIS — D649 Anemia, unspecified: Secondary | ICD-10-CM | POA: Diagnosis not present

## 2021-09-02 DIAGNOSIS — E782 Mixed hyperlipidemia: Secondary | ICD-10-CM | POA: Diagnosis not present

## 2021-09-03 LAB — CBC WITH DIFFERENTIAL/PLATELET
Basophils Absolute: 0 10*3/uL (ref 0.0–0.2)
Basos: 1 %
EOS (ABSOLUTE): 0.2 10*3/uL (ref 0.0–0.4)
Eos: 4 %
Hematocrit: 39 % (ref 34.0–46.6)
Hemoglobin: 13.2 g/dL (ref 11.1–15.9)
Immature Grans (Abs): 0 10*3/uL (ref 0.0–0.1)
Immature Granulocytes: 0 %
Lymphocytes Absolute: 1.8 10*3/uL (ref 0.7–3.1)
Lymphs: 34 %
MCH: 31.4 pg (ref 26.6–33.0)
MCHC: 33.8 g/dL (ref 31.5–35.7)
MCV: 93 fL (ref 79–97)
Monocytes Absolute: 0.5 10*3/uL (ref 0.1–0.9)
Monocytes: 10 %
Neutrophils Absolute: 2.7 10*3/uL (ref 1.4–7.0)
Neutrophils: 51 %
Platelets: 268 10*3/uL (ref 150–450)
RBC: 4.21 x10E6/uL (ref 3.77–5.28)
RDW: 10.7 % — ABNORMAL LOW (ref 11.7–15.4)
WBC: 5.2 10*3/uL (ref 3.4–10.8)

## 2021-09-03 LAB — LIPID PANEL
Chol/HDL Ratio: 2.9 ratio (ref 0.0–4.4)
Cholesterol, Total: 209 mg/dL — ABNORMAL HIGH (ref 100–199)
HDL: 71 mg/dL (ref >39)
LDL Chol Calc (NIH): 123 mg/dL — ABNORMAL HIGH (ref 0–99)
Triglycerides: 83 mg/dL (ref 0–149)
VLDL Cholesterol Cal: 15 mg/dL (ref 5–40)

## 2021-09-03 LAB — HEPATIC FUNCTION PANEL
ALT: 15 IU/L (ref 0–32)
AST: 19 IU/L (ref 0–40)
Albumin: 4.6 g/dL (ref 3.8–4.8)
Alkaline Phosphatase: 102 IU/L (ref 44–121)
Bilirubin Total: 0.8 mg/dL (ref 0.0–1.2)
Bilirubin, Direct: 0.17 mg/dL (ref 0.00–0.40)
Total Protein: 7.3 g/dL (ref 6.0–8.5)

## 2021-09-03 LAB — IRON,TIBC AND FERRITIN PANEL
Ferritin: 118 ng/mL (ref 15–150)
Iron Saturation: 27 % (ref 15–55)
Iron: 99 ug/dL (ref 27–139)
Total Iron Binding Capacity: 361 ug/dL (ref 250–450)
UIBC: 262 ug/dL (ref 118–369)

## 2021-09-10 DIAGNOSIS — G3184 Mild cognitive impairment, so stated: Secondary | ICD-10-CM | POA: Diagnosis not present

## 2021-09-10 DIAGNOSIS — M5412 Radiculopathy, cervical region: Secondary | ICD-10-CM | POA: Diagnosis not present

## 2021-09-10 DIAGNOSIS — G4733 Obstructive sleep apnea (adult) (pediatric): Secondary | ICD-10-CM | POA: Diagnosis not present

## 2021-09-10 DIAGNOSIS — R232 Flushing: Secondary | ICD-10-CM | POA: Diagnosis not present

## 2021-09-10 DIAGNOSIS — R69 Illness, unspecified: Secondary | ICD-10-CM | POA: Diagnosis not present

## 2021-09-12 DIAGNOSIS — Z961 Presence of intraocular lens: Secondary | ICD-10-CM | POA: Diagnosis not present

## 2021-09-12 DIAGNOSIS — H43813 Vitreous degeneration, bilateral: Secondary | ICD-10-CM | POA: Diagnosis not present

## 2021-09-17 ENCOUNTER — Telehealth: Payer: Self-pay

## 2021-09-22 ENCOUNTER — Other Ambulatory Visit: Payer: Self-pay | Admitting: Nurse Practitioner

## 2021-09-25 DIAGNOSIS — G4733 Obstructive sleep apnea (adult) (pediatric): Secondary | ICD-10-CM | POA: Diagnosis not present

## 2021-10-07 ENCOUNTER — Other Ambulatory Visit: Payer: Self-pay

## 2021-10-07 ENCOUNTER — Encounter: Payer: Self-pay | Admitting: Gastroenterology

## 2021-10-07 ENCOUNTER — Ambulatory Visit: Payer: Medicare HMO | Admitting: Gastroenterology

## 2021-10-07 VITALS — BP 119/69 | HR 77 | Temp 97.6°F | Wt 152.0 lb

## 2021-10-07 DIAGNOSIS — K219 Gastro-esophageal reflux disease without esophagitis: Secondary | ICD-10-CM

## 2021-10-07 NOTE — Progress Notes (Signed)
Jonathon Bellows MD, MRCP(U.K) [address]  [place]  [place], [place] [address]  Main: [phone]  Fax: [phone]   Primary Care Physician: Jonetta Osgood, NP  Primary Gastroenterologist:  Dr. Jonathon Bellows   Chief Complaint  Patient presents with   Abdominal Pain    HPI: [name] [name] is a 66 y.o. female  Summary of history :  Initially referred and seen on 06/10/2020 for GERD and colon cancer screening.  At that time she was on tonics 40 mg a day with resolution of all her symptoms which was mainly regurgitation.  Counseled her about lifestyle changes.    Interval history   06/10/2020-10/07/2021  10/03/2020: Colonoscopy: 10 mm polyp resected in the cecum sessile 5 mm polyp resected in the sigmoid colon: Cecal polyp was a tubular adenoma. She is doing well since her last visit she states she is here today just as a regular follow-up taking 40 mg of Protonix fasting in the morning on an empty stomach and she does take 40 mg of Protonix she has absolutely no symptoms she has dropped the dose to 20 mg a day and has not noticed significant heartburn but she has not done so recently.  No other GI complaints nothing more that she would like to discuss today.  She does not use a wedge pillow she was not aware of it but she does eat 2 to 3 hours before bedtime Current Outpatient Medications  Medication Sig Dispense Refill   amLODipine (NORVASC) 5 MG tablet Take 1 tablet by mouth daily.     aspirin 81 MG EC tablet Take 1 tablet by mouth daily.     Biotin 2.5 MG CAPS Take 1 capsule by mouth daily.     Cholecalciferol 25 MCG (1000 UT) tablet Take 1 tablet by mouth 1 day or 1 dose.     Clobetasol Propionate 0.05 % shampoo Use as directed twice weekly prn 118 mL 5   clonazePAM (KLONOPIN) 0.5 MG tablet Take 1 tablet (0.5 mg total) by mouth at bedtime. 30 tablet 1   conjugated estrogens (PREMARIN) vaginal cream      escitalopram (LEXAPRO) 5 MG tablet Take 1 tablet by mouth daily.      fluticasone (FLONASE) 50 MCG/ACT nasal spray PLACE 2 SPRAYS INTO BOTH NOSTRILS AS NEEDED. 48 mL 3   gabapentin (NEURONTIN) 300 MG capsule Take 2 capsules by mouth daily.     memantine (NAMENDA) 10 MG tablet Take 1 tablet by mouth 2 (two) times daily.     Multiple Vitamins-Minerals (CENTRUM SILVER) tablet Take 1 tablet by mouth daily.     pantoprazole (PROTONIX) 40 MG tablet Take 1 tablet by mouth daily.     sertraline (ZOLOFT) 50 MG tablet Take 1 tablet by mouth daily.     solifenacin (VESICARE) 5 MG tablet Take 1 tablet by mouth daily.     venlafaxine XR (EFFEXOR-XR) 37.5 MG 24 hr capsule TAKE 1 CAPSULE BY MOUTH DAILY WITH BREAKFAST. 90 capsule 1   No current facility-administered medications for this visit.    Allergies as of 10/07/2021 - Review Complete 10/07/2021  Allergen Reaction Noted   Amoxicillin Nausea Only 09/06/2012   Azithromycin Nausea Only and Other (See Comments) 12/11/2013   Ciprofloxacin Nausea Only 04/13/2017   Dicyclomine Nausea Only and Nausea And Vomiting 09/06/2012   Other Nausea Only 09/06/2012   Oxybutynin Nausea Only and Nausea And Vomiting 09/06/2012   Penicillins Nausea And Vomiting and Nausea Only 09/06/2012   Phenazopyridine Nausea  Only 11/08/2017    ROS:  General: Negative for anorexia, weight loss, fever, chills, fatigue, weakness. ENT: Negative for hoarseness, difficulty swallowing , nasal congestion. CV: Negative for chest pain, angina, palpitations, dyspnea on exertion, peripheral edema.  Respiratory: Negative for dyspnea at rest, dyspnea on exertion, cough, sputum, wheezing.  GI: See history of present illness. GU:  Negative for dysuria, hematuria, urinary incontinence, urinary frequency, nocturnal urination.  Endo: Negative for unusual weight change.    Physical Examination:   BP 119/69   Pulse 77   Temp 97.6 F (36.4 C) (Oral)   Wt 152 lb (68.9 kg)   LMP 08/09/2014   BMI 25.29 kg/m   General: Well-nourished, well-developed in no acute  distress.  Eyes: No icterus. Conjunctivae pink. Mouth: Oropharyngeal mucosa moist and pink , no lesions erythema or exudate. Neuro: Alert and oriented x 3.  Grossly intact. Skin: Warm and dry, no jaundice.   Psych: Alert and cooperative, normal mood and affect.   Imaging Studies: No results found.  Assessment and Plan:   [name] [name] is a 66 y.o. y/o female here to follow-up for GERD well controlled on 40 mg of Protonix once a day.  Adhering to lifestyle changes.  We discussed about decreasing the dose due to the benefit risk ratio of the long-term side effects of PPI usage.  She is going to try and reduce the dose to 20 mg of Protonix once a day if she does have symptoms she is going to revert back to 40 mg once a day.  If she does have good response to 20 mg once a day then she has been advised to change to famotidine.  If she has any other questions she will give [place] a call.  We discussed about the use of a wedge pillow I will give her a picture of the same to use as part of her lifestyle changes.    Dr Jonathon Bellows  MD,MRCP [facility]) Follow up in as needed

## 2021-10-07 NOTE — Patient Instructions (Signed)

## 2021-10-15 ENCOUNTER — Encounter: Payer: Self-pay | Admitting: Nurse Practitioner

## 2021-10-16 ENCOUNTER — Other Ambulatory Visit: Payer: Self-pay | Admitting: Internal Medicine

## 2021-10-16 DIAGNOSIS — Z1231 Encounter for screening mammogram for malignant neoplasm of breast: Secondary | ICD-10-CM

## 2021-10-25 DIAGNOSIS — G4733 Obstructive sleep apnea (adult) (pediatric): Secondary | ICD-10-CM | POA: Diagnosis not present

## 2021-10-30 ENCOUNTER — Other Ambulatory Visit: Payer: Self-pay | Admitting: Internal Medicine

## 2021-11-03 ENCOUNTER — Other Ambulatory Visit: Payer: Self-pay

## 2021-11-03 MED ORDER — MIRABEGRON ER 50 MG PO TB24: 50.0000 mg | ORAL_TABLET | Freq: Every day | ORAL | 1 refills | Status: DC

## 2021-11-03 NOTE — Telephone Encounter (Signed)
Pt called for due to neurology  MYRBETRIQ refills asper alyssa ok to send

## 2021-11-14 ENCOUNTER — Ambulatory Visit
Admission: RE | Admit: 2021-11-14 | Discharge: 2021-11-14 | Disposition: A | Discharge status: Home / Self Care | Payer: Medicare HMO | Source: Ambulatory Visit | Attending: Internal Medicine | Admitting: Internal Medicine

## 2021-11-14 DIAGNOSIS — Z1231 Encounter for screening mammogram for malignant neoplasm of breast: Secondary | ICD-10-CM | POA: Diagnosis not present

## 2021-11-25 DIAGNOSIS — G4733 Obstructive sleep apnea (adult) (pediatric): Secondary | ICD-10-CM | POA: Diagnosis not present

## 2021-12-26 DIAGNOSIS — G4733 Obstructive sleep apnea (adult) (pediatric): Secondary | ICD-10-CM | POA: Diagnosis not present

## 2022-01-08 ENCOUNTER — Other Ambulatory Visit: Payer: Self-pay | Admitting: Nurse Practitioner

## 2022-02-09 ENCOUNTER — Ambulatory Visit (INDEPENDENT_AMBULATORY_CARE_PROVIDER_SITE_OTHER): Payer: Medicare HMO | Admitting: Nurse Practitioner

## 2022-02-09 ENCOUNTER — Other Ambulatory Visit: Payer: Self-pay | Admitting: Internal Medicine

## 2022-02-09 ENCOUNTER — Other Ambulatory Visit: Payer: Self-pay | Admitting: Nurse Practitioner

## 2022-02-09 ENCOUNTER — Encounter: Payer: Self-pay | Admitting: Nurse Practitioner

## 2022-02-09 VITALS — BP 139/81 | HR 75 | Temp 96.7°F | Resp 16 | Ht 65.0 in | Wt 150.4 lb

## 2022-02-09 DIAGNOSIS — N951 Menopausal and female climacteric states: Secondary | ICD-10-CM | POA: Diagnosis not present

## 2022-02-09 DIAGNOSIS — Z79899 Other long term (current) drug therapy: Secondary | ICD-10-CM | POA: Diagnosis not present

## 2022-02-09 DIAGNOSIS — I10 Essential (primary) hypertension: Secondary | ICD-10-CM

## 2022-02-09 MED ORDER — VEOZAH 45 MG PO TABS: 45.0000 mg | ORAL_TABLET | Freq: Every day | ORAL | 3 refills | Status: DC

## 2022-02-09 NOTE — Progress Notes (Signed)
[facility] [place], [place] [address]  Internal MEDICINE  Office Visit Note  Patient Name: [name] [name]  [id number]  [id number]  Date of Service: 02/09/2022  Chief Complaint  Patient presents with   Follow-up    Discuss med and hot flashes.    Depression   Gastroesophageal Reflux   Hypertension    HPI [name] presents for a follow up visit to discuss hot flashes and a medication she is interested in trying. She also has hypertension which is still well-controlled with her current medications.  She is interested in trying a new medication for hot flashes called Veozah. Contraindications and side effects are minimal.  Her last hepatic panel was done in June this year and has always been normal    Current Medication: Outpatient Encounter Medications as of 02/09/2022  Medication Sig   amLODipine (NORVASC) 5 MG tablet Take 1 tablet by mouth daily.   aspirin 81 MG EC tablet Take 1 tablet by mouth daily.   Biotin 2.5 MG CAPS Take 1 capsule by mouth daily.   Cholecalciferol 25 MCG (1000 UT) tablet Take 1 tablet by mouth 1 day or 1 dose.   Clobetasol Propionate 0.05 % shampoo Use as directed twice weekly prn   clonazePAM (KLONOPIN) 0.5 MG tablet Take 1 tablet (0.5 mg total) by mouth at bedtime.   conjugated estrogens (PREMARIN) vaginal cream    escitalopram (LEXAPRO) 5 MG tablet Take 1 tablet by mouth daily.   Fezolinetant (VEOZAH) 45 MG TABS Take 45 mg by mouth daily.   fluticasone (FLONASE) 50 MCG/ACT nasal spray PLACE 2 SPRAYS INTO BOTH NOSTRILS AS NEEDED.   gabapentin (NEURONTIN) 300 MG capsule Take 2 capsules by mouth daily.   memantine (NAMENDA) 10 MG tablet Take 1 tablet by mouth 2 (two) times daily.   Multiple Vitamins-Minerals (CENTRUM SILVER) tablet Take 1 tablet by mouth daily.   MYRBETRIQ 50 MG TB24 tablet TAKE 1 TABLET BY MOUTH EVERY DAY   pantoprazole (PROTONIX) 40 MG tablet Take 1 tablet by mouth daily.   sertraline (ZOLOFT) 50 MG tablet  Take 1 tablet by mouth daily.   venlafaxine XR (EFFEXOR-XR) 37.5 MG 24 hr capsule TAKE 1 CAPSULE BY MOUTH DAILY WITH BREAKFAST.   No facility-administered encounter medications on file as of 02/09/2022.    Surgical History: Past Surgical History:  Procedure Laterality Date   BREAST BIOPSY Right 2016   cor bx   BENIGN BREAST LOBULES AND FIBROADIPOSE TISSUE    COLONOSCOPY WITH PROPOFOL N/A 10/03/2020   Procedure: COLONOSCOPY WITH PROPOFOL;  Surgeon: Jonathon Bellows, MD;  Location: [facility] ENDOSCOPY;  Service: Gastroenterology;  Laterality: N/A;   ruptured disc  1992    Medical History: Past Medical History:  Diagnosis Date   Allergic rhinitis    Depression    GERD (gastroesophageal reflux disease)    Hypertension    Postmenopausal disorder     Family History: Family History  Problem Relation Age of Onset   Asthma Mother    Diabetes Mother    Hypertension Mother    Stroke Mother    Epilepsy Mother    Coronary artery disease Mother    Breast cancer Neg Hx     Social History   Socioeconomic History   Marital status: Married    Spouse name: Not on file   Number of children: Not on file   Years of education: Not on file   Highest education level: Not on file  Occupational History   Not on  file  Tobacco Use   Smoking status: Never   Smokeless tobacco: Never  Vaping Use   Vaping Use: Never used  Substance and Sexual Activity   Alcohol use: Yes    Comment: holidays, very rarely   Drug use: Never   Sexual activity: Not on file  Other Topics Concern   Not on file  Social History Narrative   Not on file   Social Determinants of Health   Financial Resource Strain: Low Risk  (08/01/2020)   Overall Financial Resource Strain (CARDIA)    Difficulty of Paying Living Expenses: Not very hard  Food Insecurity: Not on file  Transportation Needs: Not on file  Physical Activity: Not on file  Stress: Not on file  Social Connections: Not on file  Intimate Partner Violence: Not on  file      Review of Systems  Constitutional:  Positive for diaphoresis and fatigue. Negative for fever.  HENT:  Negative for congestion, mouth sores and postnasal drip.   Respiratory: Negative.  Negative for cough, chest tightness, shortness of breath and wheezing.   Cardiovascular: Negative.  Negative for chest pain and palpitations.  Gastrointestinal: Negative.   Endocrine: Positive for heat intolerance.  Genitourinary:  Negative for flank pain.       Night sweats and hot flashes  Neurological:  Negative for weakness.  Psychiatric/Behavioral:  Positive for depression and sleep disturbance.     Vital Signs: BP 139/81   Pulse 75   Temp (!) 96.7 F (35.9 C)   Resp 16   Ht '5\' 5"'$  (1.651 m)   Wt 150 lb 6.4 oz (68.2 kg)   LMP 08/09/2014   SpO2 99%   BMI 25.03 kg/m    Physical Exam Vitals reviewed.  Constitutional:      General: She is not in acute distress.    Appearance: Normal appearance. She is not ill-appearing.  HENT:     Head: Normocephalic and atraumatic.  Eyes:     Pupils: Pupils are equal, round, and reactive to light.  Pulmonary:     Effort: Pulmonary effort is normal. No respiratory distress.  Neurological:     Mental Status: She is alert and oriented to person, place, and time.  Psychiatric:        Mood and Affect: Mood normal.        Behavior: Behavior normal.        Assessment/Plan: 1. Vasomotor symptoms due to menopause Discussed new medication Veozah with patient who has researched the medication herself and discussed with her insurance to make sure that they cover it. Her hepatic function panel was last drawn in June and was normal, with no history of abnormal hepatic function. She has no contraindications for taking this medication. Possible side effects are minimal and low risk. Instructed patient that she can take the medication with or without food but that she should take it at the same time each day. This medication may require a prior  authorization which the patient is aware of so she may not be able to fill the prescription at the pharmacy right away.  - Fezolinetant (VEOZAH) 45 MG TABS; Take 45 mg by mouth daily.  Dispense: 30 tablet; Refill: 3 - Hepatic function panel  2. Essential hypertension BP stable, continue current medications as prescribed.   3. High risk medication use Hepatic function panel should be checked at 3 months, 6 months and 9 months after initiating therapy with Veozah. There is a low risk of elevated liver enzymes as  a side effect of the medication. Follow up in 3 months.  - Fezolinetant (VEOZAH) 45 MG TABS; Take 45 mg by mouth daily.  Dispense: 30 tablet; Refill: 3 - Hepatic function panel   General Counseling: Sandy Salaam understanding of the findings of todays visit and agrees with plan of treatment. I have discussed any further diagnostic evaluation that may be needed or ordered today. We also reviewed her medications today. she has been encouraged to call the office with any questions or concerns that should arise related to todays visit.    No orders of the defined types were placed in this encounter.   Meds ordered this encounter  Medications   Fezolinetant (VEOZAH) 45 MG TABS    Sig: Take 45 mg by mouth daily.    Dispense:  30 tablet    Refill:  3    Note new medication, may need prior authorization, please send this asap if needed.    Return in about 3 months (around 05/12/2022) for F/U, eval new med, with [facility] or Franki Stemen.   Total time spent:30 Minutes Time spent includes review of chart, medications, test results, and follow up plan with the patient.   [place] Controlled Substance Database was reviewed by me.  This patient was seen by Jonetta Osgood, FNP-C in collaboration with Dr. Clayborn Bigness as a part of collaborative care agreement.   Rendon Howell R. Valetta Fuller, MSN, FNP-C Internal medicine

## 2022-02-10 LAB — HEPATIC FUNCTION PANEL
ALT: 16 IU/L (ref 0–32)
AST: 17 IU/L (ref 0–40)
Albumin: 4.3 g/dL (ref 3.9–4.9)
Alkaline Phosphatase: 118 IU/L (ref 44–121)
Bilirubin Total: 0.8 mg/dL (ref 0.0–1.2)
Bilirubin, Direct: 0.2 mg/dL (ref 0.00–0.40)
Total Protein: 6.7 g/dL (ref 6.0–8.5)

## 2022-02-10 NOTE — Telephone Encounter (Signed)
Please take care!

## 2022-02-11 ENCOUNTER — Other Ambulatory Visit: Payer: Self-pay | Admitting: Internal Medicine

## 2022-02-16 ENCOUNTER — Other Ambulatory Visit: Payer: Self-pay

## 2022-02-17 ENCOUNTER — Telehealth: Payer: Self-pay

## 2022-02-17 NOTE — Telephone Encounter (Signed)
Sent P.A. for patient's Veozah.

## 2022-02-24 ENCOUNTER — Telehealth: Payer: Self-pay

## 2022-02-24 NOTE — Telephone Encounter (Signed)
Pt advised that insurance approved for [employer]

## 2022-02-26 ENCOUNTER — Ambulatory Visit: Payer: Medicare HMO | Admitting: Nurse Practitioner

## 2022-02-26 DIAGNOSIS — I10 Essential (primary) hypertension: Secondary | ICD-10-CM | POA: Diagnosis not present

## 2022-02-26 DIAGNOSIS — R69 Illness, unspecified: Secondary | ICD-10-CM | POA: Diagnosis not present

## 2022-02-26 DIAGNOSIS — G3184 Mild cognitive impairment, so stated: Secondary | ICD-10-CM | POA: Diagnosis not present

## 2022-02-27 ENCOUNTER — Other Ambulatory Visit: Payer: Self-pay | Admitting: Nurse Practitioner

## 2022-02-27 DIAGNOSIS — K219 Gastro-esophageal reflux disease without esophagitis: Secondary | ICD-10-CM

## 2022-03-04 ENCOUNTER — Other Ambulatory Visit: Payer: Self-pay | Admitting: Internal Medicine

## 2022-03-04 DIAGNOSIS — J309 Allergic rhinitis, unspecified: Secondary | ICD-10-CM

## 2022-03-09 ENCOUNTER — Encounter: Payer: Self-pay | Admitting: Gastroenterology

## 2022-03-09 ENCOUNTER — Other Ambulatory Visit: Payer: Self-pay | Admitting: Internal Medicine

## 2022-03-09 ENCOUNTER — Other Ambulatory Visit: Payer: Self-pay | Admitting: Nurse Practitioner

## 2022-03-09 ENCOUNTER — Other Ambulatory Visit: Payer: Self-pay

## 2022-03-09 ENCOUNTER — Ambulatory Visit: Payer: Medicare HMO | Admitting: Gastroenterology

## 2022-03-09 VITALS — BP 126/68 | HR 90 | Temp 97.8°F | Wt 147.2 lb

## 2022-03-09 DIAGNOSIS — K219 Gastro-esophageal reflux disease without esophagitis: Secondary | ICD-10-CM

## 2022-03-09 DIAGNOSIS — I10 Essential (primary) hypertension: Secondary | ICD-10-CM

## 2022-03-09 MED ORDER — AMLODIPINE BESYLATE 5 MG PO TABS: 5.0000 mg | ORAL_TABLET | Freq: Every day | ORAL | 3 refills | Status: DC

## 2022-03-09 NOTE — Progress Notes (Signed)
Jonathon Bellows MD, MRCP(U.K) [address]  [place]  [place], [place] [address]  Main: [phone]  Fax: [phone]   Primary Care Physician: Jonetta Osgood, NP  Primary Gastroenterologist:  Dr. Jonathon Bellows   Chief Complaint  Patient presents with   Gastroesophageal Reflux    HPI: [name] [name] is a 66 y.o. female  Summary of history :   Initially referred and seen on 06/10/2020 for GERD and colon cancer screening.  At that time she was on Protonix 40 mg a day with resolution of all her symptoms which was mainly regurgitation.  Counseled her about lifestyle changes.  10/03/2020: Colonoscopy: 10 mm polyp resected in the cecum sessile 5 mm polyp resected in the sigmoid colon: Cecal polyp was a tubular adenoma.    Interval history 10/07/2021-05/10/2021  She is presently using a bed which keep the head end of the bed elevated.  She is on 40 mg once a day of Protonix has tried to reduce the dose in the past but has recurrence of symptoms.  No other complaints presently today    Current Outpatient Medications  Medication Sig Dispense Refill   amLODipine (NORVASC) 5 MG tablet Take 1 tablet by mouth daily.     aspirin 81 MG EC tablet Take 1 tablet by mouth daily.     Biotin 2.5 MG CAPS Take 1 capsule by mouth daily.     Cetirizine HCl 10 MG CAPS Take 1 capsule by mouth daily.     Cholecalciferol 25 MCG (1000 UT) tablet Take 1 tablet by mouth 1 day or 1 dose.     Clobetasol Propionate 0.05 % shampoo Use as directed twice weekly prn 118 mL 5   clonazePAM (KLONOPIN) 0.5 MG tablet Take 1 tablet (0.5 mg total) by mouth at bedtime. 30 tablet 1   conjugated estrogens (PREMARIN) vaginal cream      escitalopram (LEXAPRO) 5 MG tablet Take 1 tablet by mouth daily.     fluticasone (FLONASE) 50 MCG/ACT nasal spray PLACE 2 SPRAYS INTO BOTH NOSTRILS AS NEEDED. 48 mL 3   gabapentin (NEURONTIN) 300 MG capsule Take 2 capsules by mouth daily.     memantine (NAMENDA) 10 MG tablet Take 1  tablet by mouth 2 (two) times daily.     Multiple Vitamins-Minerals (CENTRUM SILVER) tablet Take 1 tablet by mouth daily.     MYRBETRIQ 50 MG TB24 tablet TAKE 1 TABLET BY MOUTH EVERY DAY 30 tablet 1   pantoprazole (PROTONIX) 40 MG tablet Take 1 tablet by mouth daily.     sertraline (ZOLOFT) 50 MG tablet Take 1 tablet by mouth daily.     SPIKEVAX injection Inject 0.5 mLs into the muscle once.     venlafaxine XR (EFFEXOR-XR) 37.5 MG 24 hr capsule TAKE 1 CAPSULE BY MOUTH DAILY WITH BREAKFAST. 90 capsule 1   VEOZAH 45 MG TABS TAKE 1 TABLET BY MOUTH EVERY DAY 30 tablet 3   No current facility-administered medications for this visit.    Allergies as of 03/09/2022 - Review Complete 02/09/2022  Allergen Reaction Noted   Amoxicillin Nausea Only 09/06/2012   Azithromycin Nausea Only and Other (See Comments) 12/11/2013   Ciprofloxacin Nausea Only 04/13/2017   Dicyclomine Nausea Only and Nausea And Vomiting 09/06/2012   Other Nausea Only 09/06/2012   Oxybutynin Nausea Only and Nausea And Vomiting 09/06/2012   Penicillins Nausea And Vomiting and Nausea Only 09/06/2012   Phenazopyridine Nausea Only 11/08/2017    ROS:  General: Negative for anorexia,  weight loss, fever, chills, fatigue, weakness. ENT: Negative for hoarseness, difficulty swallowing , nasal congestion. CV: Negative for chest pain, angina, palpitations, dyspnea on exertion, peripheral edema.  Respiratory: Negative for dyspnea at rest, dyspnea on exertion, cough, sputum, wheezing.  GI: See history of present illness. GU:  Negative for dysuria, hematuria, urinary incontinence, urinary frequency, nocturnal urination.  Endo: Negative for unusual weight change.    Physical Examination:   LMP 08/09/2014   General: Well-nourished, well-developed in no acute distress.  Eyes: No icterus. Conjunctivae pink. Neuro: Alert and oriented x 3.  Grossly intact. Skin: Warm and dry, no jaundice.   Psych: Alert and cooperative, normal mood and  affect.   Imaging Studies: No results found.  Assessment and Plan:   [name] [name] is a 66 y.o. y/o female here to follow-up for GERD.  She is doing well on 40 mg of Protonix once a day adhering to lifestyle changes uses a bed where the head end of the bed kept elevated.  She has tried in the past decreasing the dose of Protonix to 20 mg a day but has had recurrence of her symptoms she is going to try once again.  We discussed the risks versus benefits of long-term PPIs including but not limited to thinning of the bones low magnesium increased risk of infection but potential benefits of improvement in the quality of life, reduce the risk of esophageal cancer.  Based on this we discussed that the aim would be to use the lowest dose for the shortest.  Of time.   Dr Jonathon Bellows  MD,MRCP [facility]) Follow up in as needed

## 2022-03-09 NOTE — Telephone Encounter (Signed)
Please check and send

## 2022-03-12 ENCOUNTER — Encounter: Payer: Self-pay | Admitting: Nurse Practitioner

## 2022-03-12 ENCOUNTER — Ambulatory Visit (INDEPENDENT_AMBULATORY_CARE_PROVIDER_SITE_OTHER): Payer: Medicare HMO | Admitting: Nurse Practitioner

## 2022-03-12 VITALS — BP 141/71 | HR 72 | Temp 97.6°F | Resp 16 | Ht 65.0 in | Wt 148.0 lb

## 2022-03-12 DIAGNOSIS — L42 Pityriasis rosea: Secondary | ICD-10-CM | POA: Diagnosis not present

## 2022-03-12 MED ORDER — TRIAMCINOLONE ACETONIDE 0.1 % EX LOTN: 1.0000 | TOPICAL_LOTION | Freq: Three times a day (TID) | CUTANEOUS | 0 refills | Status: DC

## 2022-03-12 NOTE — Progress Notes (Signed)
[facility] [place], [place] [address]  Internal MEDICINE  Office Visit Note  Patient Name: [name] [name]  [id number]  [id number]  Date of Service: 03/12/2022  Chief Complaint  Patient presents with   Rash    Rash under breast     HPI [name] [name] presents for an acute sick visit for splotchy rash --noticed rash - 1 week ago on right side of neck it has now spread to abdomen, chest, and back and a few small spots on her arms.  Not itchy, not painful, not tender.       Current Medication:  Outpatient Encounter Medications as of 03/12/2022  Medication Sig   amLODipine (NORVASC) 5 MG tablet Take 1 tablet (5 mg total) by mouth daily.   aspirin 81 MG EC tablet Take 1 tablet by mouth daily.   Biotin 2.5 MG CAPS Take 1 capsule by mouth daily.   Cetirizine HCl 10 MG CAPS Take 1 capsule by mouth daily.   Cholecalciferol 25 MCG (1000 UT) tablet Take 1 tablet by mouth 1 day or 1 dose.   Clobetasol Propionate 0.05 % shampoo Use as directed twice weekly prn   fluticasone (FLONASE) 50 MCG/ACT nasal spray PLACE 2 SPRAYS INTO BOTH NOSTRILS AS NEEDED.   memantine (NAMENDA) 10 MG tablet Take 1 tablet by mouth 2 (two) times daily.   Multiple Vitamins-Minerals (CENTRUM SILVER) tablet Take 1 tablet by mouth daily.   MYRBETRIQ 50 MG TB24 tablet TAKE 1 TABLET BY MOUTH EVERY DAY   pantoprazole (PROTONIX) 40 MG tablet Take 1 tablet by mouth daily.   triamcinolone lotion (KENALOG) 0.1 % Apply 1 Application topically 3 (three) times daily.   VEOZAH 45 MG TABS TAKE 1 TABLET BY MOUTH EVERY DAY   [DISCONTINUED] clonazePAM (KLONOPIN) 0.5 MG tablet Take 1 tablet (0.5 mg total) by mouth at bedtime.   [DISCONTINUED] conjugated estrogens (PREMARIN) vaginal cream    [DISCONTINUED] sertraline (ZOLOFT) 50 MG tablet TAKE 1 TABLET BY MOUTH EVERY DAY   [DISCONTINUED] SPIKEVAX injection Inject 0.5 mLs into the muscle once.   [DISCONTINUED] venlafaxine XR (EFFEXOR-XR) 37.5 MG 24 hr capsule  TAKE 1 CAPSULE BY MOUTH DAILY WITH BREAKFAST.   escitalopram (LEXAPRO) 5 MG tablet Take 1 tablet by mouth daily.   No facility-administered encounter medications on file as of 03/12/2022.      Medical History: Past Medical History:  Diagnosis Date   Allergic rhinitis    Depression    GERD (gastroesophageal reflux disease)    Hypertension    Postmenopausal disorder      Vital Signs: BP (!) 141/71   Pulse 72   Temp 97.6 F (36.4 C)   Resp 16   Ht '5\' 5"'$  (1.651 m)   Wt 148 lb (67.1 kg)   LMP 08/09/2014   SpO2 99%   BMI 24.63 kg/m    Review of Systems  Skin:  Positive for rash (splotchy red patches, see HPI).    Physical Exam Vitals reviewed.  Skin:    Findings: Rash present. Rash is macular and papular.          Comments: Marked area designates herald patch Arrows represent spread of rash. Nontender, nonpruritic, and not painful       Assessment/Plan: 1. Pityriasis rosea Discussed OTC interventions for symptom relief if she experiences itching. Provided handout with information about the rash. She had this 20+ years ago and has not had it since then.    General Counseling: zenia guest understanding of the  findings of todays visit and agrees with plan of treatment. I have discussed any further diagnostic evaluation that may be needed or ordered today. We also reviewed her medications today. she has been encouraged to call the office with any questions or concerns that should arise related to todays visit.    Counseling:    No orders of the defined types were placed in this encounter.   Meds ordered this encounter  Medications   triamcinolone lotion (KENALOG) 0.1 %    Sig: Apply 1 Application topically 3 (three) times daily.    Dispense:  60 mL    Refill:  0    Return if symptoms worsen or fail to improve.  [place] Controlled Substance Database was reviewed by me for overdose risk score (ORS)  Time spent:20 Minutes Time spent with patient  included reviewing progress notes, labs, imaging studies, and discussing plan for follow up.   This patient was seen by Jonetta Osgood, FNP-C in collaboration with Dr. Clayborn Bigness as a part of collaborative care agreement.  Darlene Bartelt R. Valetta Fuller, MSN, FNP-C Internal Medicine

## 2022-03-21 ENCOUNTER — Other Ambulatory Visit: Payer: Self-pay | Admitting: Nurse Practitioner

## 2022-05-12 ENCOUNTER — Ambulatory Visit (INDEPENDENT_AMBULATORY_CARE_PROVIDER_SITE_OTHER): Payer: Medicare HMO | Admitting: Nurse Practitioner

## 2022-05-12 ENCOUNTER — Encounter: Payer: Self-pay | Admitting: Nurse Practitioner

## 2022-05-12 VITALS — BP 140/79 | HR 65 | Temp 96.9°F | Resp 16 | Ht 65.0 in | Wt 147.2 lb

## 2022-05-12 DIAGNOSIS — N951 Menopausal and female climacteric states: Secondary | ICD-10-CM | POA: Diagnosis not present

## 2022-05-12 DIAGNOSIS — B360 Pityriasis versicolor: Secondary | ICD-10-CM | POA: Diagnosis not present

## 2022-05-12 DIAGNOSIS — I10 Essential (primary) hypertension: Secondary | ICD-10-CM | POA: Diagnosis not present

## 2022-05-12 MED ORDER — CLOTRIMAZOLE-BETAMETHASONE 1-0.05 % EX CREA: 1.0000 | TOPICAL_CREAM | Freq: Two times a day (BID) | CUTANEOUS | 2 refills | Status: AC

## 2022-05-12 NOTE — Progress Notes (Signed)
[facility] [place], [place] [address]  Internal MEDICINE  Office Visit Note  Patient Name: [name] [name]  [id number]  [id number]  Date of Service: 05/12/2022  Chief Complaint  Patient presents with   Depression   Gastroesophageal Reflux   Hypertension   Follow-up    HPI Marjani presents for a follow up visit for medication refill.  3 month follow up for veozah, doing well, medication is helping with hot flashes.  Has a rash that was thought to be pityriasis rosea but is now looking more consistent with tinea versicolor.  Reports that the rash is slightly itchy sometimes, not painful but the rash looks more like macules of hypopigmentation.    Current Medication: Outpatient Encounter Medications as of 05/12/2022  Medication Sig   amLODipine (NORVASC) 5 MG tablet Take 1 tablet (5 mg total) by mouth daily.   aspirin 81 MG EC tablet Take 1 tablet by mouth daily.   Biotin 2.5 MG CAPS Take 1 capsule by mouth daily.   Cetirizine HCl 10 MG CAPS Take 1 capsule by mouth daily.   Cholecalciferol 25 MCG (1000 UT) tablet Take 1 tablet by mouth 1 day or 1 dose.   Clobetasol Propionate 0.05 % shampoo Use as directed twice weekly prn   clotrimazole-betamethasone (LOTRISONE) cream Apply 1 Application topically 2 (two) times daily.   fluticasone (FLONASE) 50 MCG/ACT nasal spray PLACE 2 SPRAYS INTO BOTH NOSTRILS AS NEEDED.   memantine (NAMENDA) 10 MG tablet Take 1 tablet by mouth 2 (two) times daily.   Multiple Vitamins-Minerals (CENTRUM SILVER) tablet Take 1 tablet by mouth daily.   MYRBETRIQ 50 MG TB24 tablet TAKE 1 TABLET BY MOUTH EVERY DAY   pantoprazole (PROTONIX) 40 MG tablet TAKE 1 TABLET BY MOUTH EVERY DAY   triamcinolone lotion (KENALOG) 0.1 % Apply 1 Application topically 3 (three) times daily.   VEOZAH 45 MG TABS TAKE 1 TABLET BY MOUTH EVERY DAY   escitalopram (LEXAPRO) 5 MG tablet Take 1 tablet by mouth daily.   No facility-administered encounter  medications on file as of 05/12/2022.    Surgical History: Past Surgical History:  Procedure Laterality Date   BREAST BIOPSY Right 2016   cor bx   BENIGN BREAST LOBULES AND FIBROADIPOSE TISSUE    COLONOSCOPY WITH PROPOFOL N/A 10/03/2020   Procedure: COLONOSCOPY WITH PROPOFOL;  Surgeon: Jonathon Bellows, MD;  Location: [facility] ENDOSCOPY;  Service: Gastroenterology;  Laterality: N/A;   ruptured disc  1992    Medical History: Past Medical History:  Diagnosis Date   Allergic rhinitis    Depression    GERD (gastroesophageal reflux disease)    Hypertension    Postmenopausal disorder     Family History: Family History  Problem Relation Age of Onset   Asthma Mother    Diabetes Mother    Hypertension Mother    Stroke Mother    Epilepsy Mother    Coronary artery disease Mother    Breast cancer Neg Hx     Social History   Socioeconomic History   Marital status: Married    Spouse name: Not on file   Number of children: Not on file   Years of education: Not on file   Highest education level: Not on file  Occupational History   Not on file  Tobacco Use   Smoking status: Never   Smokeless tobacco: Never  Vaping Use   Vaping Use: Never used  Substance and Sexual Activity   Alcohol use: Yes  Comment: holidays, very rarely   Drug use: Never   Sexual activity: Not on file  Other Topics Concern   Not on file  Social History Narrative   Not on file   Social Determinants of Health   Financial Resource Strain: Low Risk  (08/01/2020)   Overall Financial Resource Strain (CARDIA)    Difficulty of Paying Living Expenses: Not very hard  Food Insecurity: Not on file  Transportation Needs: Not on file  Physical Activity: Not on file  Stress: Not on file  Social Connections: Not on file  Intimate Partner Violence: Not on file      Review of Systems  Constitutional:  Positive for diaphoresis (improving) and fatigue (improving). Negative for fever.  HENT:  Negative for congestion,  mouth sores and postnasal drip.   Respiratory: Negative.  Negative for cough, chest tightness, shortness of breath and wheezing.   Cardiovascular: Negative.  Negative for chest pain and palpitations.  Gastrointestinal: Negative.   Endocrine: Positive for heat intolerance (improing).  Genitourinary:        Night sweats and hot flashes  Skin:  Positive for rash (scattered slightly itchy areas of hypopigmentation. , see HPI).  Neurological:  Negative for weakness.  Psychiatric/Behavioral:  Positive for sleep disturbance (improving).     Vital Signs: BP (!) 140/79   Pulse 65   Temp (!) 96.9 F (36.1 C)   Resp 16   Ht '5\' 5"'$  (1.651 m)   Wt 147 lb 3.2 oz (66.8 kg)   LMP 08/09/2014   SpO2 97%   BMI 24.50 kg/m    Physical Exam Vitals reviewed.  Constitutional:      General: She is not in acute distress.    Appearance: Normal appearance. She is not ill-appearing.  HENT:     Head: Normocephalic and atraumatic.  Eyes:     Pupils: Pupils are equal, round, and reactive to light.  Pulmonary:     Effort: Pulmonary effort is normal. No respiratory distress.  Skin:    Findings: Rash present. Rash is macular.          Comments: Macular hypopigmented patches. Arrows represent spread of rash. Nontender, slightly pruritic, and not painful  Neurological:     Mental Status: She is alert and oriented to person, place, and time.  Psychiatric:        Mood and Affect: Mood normal.        Behavior: Behavior normal.        Assessment/Plan: 1. Vasomotor symptoms due to menopause Continue veozah as prescribed.   2. Tinea versicolor Topical medication prescribed to treat rash. - clotrimazole-betamethasone (LOTRISONE) cream; Apply 1 Application topically 2 (two) times daily.  Dispense: 45 g; Refill: 2  3. Essential hypertension Stable, continue medication as prescribed.    General Counseling: tequia pelham understanding of the findings of todays visit and agrees with plan of  treatment. I have discussed any further diagnostic evaluation that may be needed or ordered today. We also reviewed her medications today. she has been encouraged to call the office with any questions or concerns that should arise related to todays visit.    No orders of the defined types were placed in this encounter.   Meds ordered this encounter  Medications   clotrimazole-betamethasone (LOTRISONE) cream    Sig: Apply 1 Application topically 2 (two) times daily.    Dispense:  45 g    Refill:  2    Return if symptoms worsen or fail to improve, for previously scheduled,  CPE, Petra Dumler PCP in june.   Total time spent:30 Minutes Time spent includes review of chart, medications, test results, and follow up plan with the patient.    Controlled Substance Database was reviewed by me.  This patient was seen by Jonetta Osgood, FNP-C in collaboration with Dr. Clayborn Bigness as a part of collaborative care agreement.   Clayden Withem R. Valetta Fuller, MSN, FNP-C Internal medicine

## 2022-05-13 ENCOUNTER — Other Ambulatory Visit: Payer: Self-pay | Admitting: Nurse Practitioner

## 2022-05-14 DIAGNOSIS — G4733 Obstructive sleep apnea (adult) (pediatric): Secondary | ICD-10-CM | POA: Diagnosis not present

## 2022-05-14 DIAGNOSIS — Z9989 Dependence on other enabling machines and devices: Secondary | ICD-10-CM | POA: Diagnosis not present

## 2022-05-14 DIAGNOSIS — G3184 Mild cognitive impairment, so stated: Secondary | ICD-10-CM | POA: Diagnosis not present

## 2022-05-14 DIAGNOSIS — F5101 Primary insomnia: Secondary | ICD-10-CM | POA: Diagnosis not present

## 2022-05-14 DIAGNOSIS — Z79899 Other long term (current) drug therapy: Secondary | ICD-10-CM | POA: Diagnosis not present

## 2022-05-14 DIAGNOSIS — R69 Illness, unspecified: Secondary | ICD-10-CM | POA: Diagnosis not present

## 2022-05-21 DIAGNOSIS — R69 Illness, unspecified: Secondary | ICD-10-CM | POA: Diagnosis not present

## 2022-05-21 DIAGNOSIS — G309 Alzheimer's disease, unspecified: Secondary | ICD-10-CM | POA: Diagnosis not present

## 2022-05-23 ENCOUNTER — Encounter: Payer: Self-pay | Admitting: Nurse Practitioner

## 2022-05-25 DIAGNOSIS — G3184 Mild cognitive impairment, so stated: Secondary | ICD-10-CM | POA: Diagnosis not present

## 2022-05-28 ENCOUNTER — Other Ambulatory Visit: Payer: Self-pay | Admitting: Internal Medicine

## 2022-05-28 ENCOUNTER — Other Ambulatory Visit: Payer: Self-pay | Admitting: Nurse Practitioner

## 2022-05-28 DIAGNOSIS — L409 Psoriasis, unspecified: Secondary | ICD-10-CM

## 2022-07-01 DIAGNOSIS — G4733 Obstructive sleep apnea (adult) (pediatric): Secondary | ICD-10-CM | POA: Diagnosis not present

## 2022-07-08 DIAGNOSIS — R9402 Abnormal brain scan: Secondary | ICD-10-CM | POA: Diagnosis not present

## 2022-07-08 DIAGNOSIS — G309 Alzheimer's disease, unspecified: Secondary | ICD-10-CM | POA: Diagnosis not present

## 2022-07-08 DIAGNOSIS — R69 Illness, unspecified: Secondary | ICD-10-CM | POA: Diagnosis not present

## 2022-07-09 DIAGNOSIS — G3184 Mild cognitive impairment, so stated: Secondary | ICD-10-CM | POA: Diagnosis not present

## 2022-07-11 DIAGNOSIS — G4733 Obstructive sleep apnea (adult) (pediatric): Secondary | ICD-10-CM | POA: Diagnosis not present

## 2022-07-13 DIAGNOSIS — R5383 Other fatigue: Secondary | ICD-10-CM | POA: Diagnosis not present

## 2022-07-13 DIAGNOSIS — R413 Other amnesia: Secondary | ICD-10-CM | POA: Diagnosis not present

## 2022-07-20 DIAGNOSIS — G3184 Mild cognitive impairment, so stated: Secondary | ICD-10-CM | POA: Diagnosis not present

## 2022-07-24 ENCOUNTER — Other Ambulatory Visit: Payer: Self-pay | Admitting: Nurse Practitioner

## 2022-07-29 DIAGNOSIS — G3184 Mild cognitive impairment, so stated: Secondary | ICD-10-CM | POA: Diagnosis not present

## 2022-09-03 ENCOUNTER — Ambulatory Visit: Payer: Medicare HMO | Admitting: Nurse Practitioner

## 2022-09-15 ENCOUNTER — Other Ambulatory Visit: Payer: Self-pay | Admitting: Nurse Practitioner

## 2022-09-15 DIAGNOSIS — Z961 Presence of intraocular lens: Secondary | ICD-10-CM | POA: Diagnosis not present

## 2022-09-16 ENCOUNTER — Ambulatory Visit: Payer: Medicare HMO | Admitting: Nurse Practitioner

## 2022-09-29 DIAGNOSIS — L42 Pityriasis rosea: Secondary | ICD-10-CM | POA: Diagnosis not present

## 2022-10-03 ENCOUNTER — Other Ambulatory Visit: Payer: Self-pay | Admitting: Nurse Practitioner

## 2022-10-05 NOTE — Telephone Encounter (Signed)
Ok to send med next appt tomorrow

## 2022-10-06 ENCOUNTER — Ambulatory Visit (INDEPENDENT_AMBULATORY_CARE_PROVIDER_SITE_OTHER): Payer: Medicare HMO | Admitting: Nurse Practitioner

## 2022-10-06 ENCOUNTER — Encounter: Payer: Self-pay | Admitting: Nurse Practitioner

## 2022-10-06 VITALS — BP 126/78 | HR 84 | Temp 98.3°F | Resp 16 | Ht 65.0 in | Wt 145.4 lb

## 2022-10-06 DIAGNOSIS — E782 Mixed hyperlipidemia: Secondary | ICD-10-CM | POA: Diagnosis not present

## 2022-10-06 DIAGNOSIS — Z0001 Encounter for general adult medical examination with abnormal findings: Secondary | ICD-10-CM

## 2022-10-06 DIAGNOSIS — L42 Pityriasis rosea: Secondary | ICD-10-CM | POA: Diagnosis not present

## 2022-10-06 DIAGNOSIS — I10 Essential (primary) hypertension: Secondary | ICD-10-CM

## 2022-10-06 DIAGNOSIS — R300 Dysuria: Secondary | ICD-10-CM

## 2022-10-06 DIAGNOSIS — N951 Menopausal and female climacteric states: Secondary | ICD-10-CM

## 2022-10-06 NOTE — Progress Notes (Signed)
[facility] [address] [place], [place] [address]  Internal MEDICINE  Office Visit Note  Patient Name: [name] [name]  [id number]  [id number]  Date of Service: 10/06/2022  Chief Complaint  Patient presents with   Depression   Gastroesophageal Reflux   Hypertension   Medicare Wellness    HPI Ziyanna presents for an annual well visit and physical exam.  Well-appearing 67 y.o. female with hypertension, allergic rhinitis, GERD, hypothyroidism, lumbar radiculopathy, psoriasis, insomnia and depression  Routine CRC screening: due in 2031 Routine mammogram: due in August this year  DEXA scan: done in 2020 Labs: due for updated lipid panel only New or worsening pain: none Other concerns: veozah is still working. Has some hot flashes and sweats but the severity remains significantly decreased.     Current Medication: Outpatient Encounter Medications as of 10/06/2022  Medication Sig   amLODipine (NORVASC) 5 MG tablet Take 1 tablet (5 mg total) by mouth daily.   aspirin 81 MG EC tablet Take 1 tablet by mouth daily.   Biotin 2.5 MG CAPS Take 1 capsule by mouth daily.   Cetirizine HCl 10 MG CAPS Take 1 capsule by mouth daily.   Cholecalciferol 25 MCG (1000 UT) tablet Take 1 tablet by mouth 1 day or 1 dose.   Clobetasol Propionate 0.05 % shampoo Use as directed twice weekly prn   clotrimazole-betamethasone (LOTRISONE) cream Apply 1 Application topically 2 (two) times daily.   fluticasone (FLONASE) 50 MCG/ACT nasal spray PLACE 2 SPRAYS INTO BOTH NOSTRILS AS NEEDED.   memantine (NAMENDA) 10 MG tablet TAKE 1 TABLET BY MOUTH TWICE A DAY   Multiple Vitamins-Minerals (CENTRUM SILVER) tablet Take 1 tablet by mouth daily.   MYRBETRIQ 50 MG TB24 tablet TAKE 1 TABLET BY MOUTH EVERY DAY   pantoprazole (PROTONIX) 40 MG tablet TAKE 1 TABLET BY MOUTH EVERY DAY   triamcinolone lotion (KENALOG) 0.1 % APPLY TO AFFECTED AREA EVERY DAY AS NEEDED   VEOZAH 45 MG TABS TAKE 1 TABLET BY MOUTH EVERY  DAY   escitalopram (LEXAPRO) 5 MG tablet Take 1 tablet by mouth daily.   No facility-administered encounter medications on file as of 10/06/2022.    Surgical History: Past Surgical History:  Procedure Laterality Date   BREAST BIOPSY Right 2016   cor bx   BENIGN BREAST LOBULES AND FIBROADIPOSE TISSUE    COLONOSCOPY WITH PROPOFOL N/A 10/03/2020   Procedure: COLONOSCOPY WITH PROPOFOL;  Surgeon: Wyline Mood, MD;  Location: [facility] ENDOSCOPY;  Service: Gastroenterology;  Laterality: N/A;   ruptured disc  1992    Medical History: Past Medical History:  Diagnosis Date   Allergic rhinitis    Depression    GERD (gastroesophageal reflux disease)    Hypertension    Postmenopausal disorder     Family History: Family History  Problem Relation Age of Onset   Asthma Mother    Diabetes Mother    Hypertension Mother    Stroke Mother    Epilepsy Mother    Coronary artery disease Mother    Breast cancer Neg Hx     Social History   Socioeconomic History   Marital status: Married    Spouse name: Not on file   Number of children: Not on file   Years of education: Not on file   Highest education level: Not on file  Occupational History   Not on file  Tobacco Use   Smoking status: Never   Smokeless tobacco: Never  Vaping Use   Vaping Use: Never  used  Substance and Sexual Activity   Alcohol use: Yes    Comment: holidays, very rarely   Drug use: Never   Sexual activity: Not on file  Other Topics Concern   Not on file  Social History Narrative   Not on file   Social Determinants of Health   Financial Resource Strain: Low Risk  (08/01/2020)   Overall Financial Resource Strain (CARDIA)    Difficulty of Paying Living Expenses: Not very hard  Food Insecurity: Not on file  Transportation Needs: Not on file  Physical Activity: Not on file  Stress: Not on file  Social Connections: Not on file  Intimate Partner Violence: Not on file      Review of Systems  Constitutional:  Negative  for activity change, appetite change, chills, fatigue, fever and unexpected weight change.  HENT: Negative.  Negative for congestion, ear pain, rhinorrhea, sore throat and trouble swallowing.   Eyes: Negative.   Respiratory: Negative.  Negative for cough, chest tightness, shortness of breath and wheezing.   Cardiovascular: Negative.  Negative for chest pain.  Gastrointestinal: Negative.  Negative for abdominal pain, blood in stool, constipation, diarrhea, nausea and vomiting.  Endocrine: Negative.   Genitourinary: Negative.  Negative for difficulty urinating, dysuria, frequency, hematuria and urgency.  Musculoskeletal: Negative.  Negative for arthralgias, back pain, joint swelling, myalgias and neck pain.  Skin: Negative.  Negative for rash and wound.  Allergic/Immunologic: Negative.  Negative for immunocompromised state.  Neurological: Negative.  Negative for dizziness, seizures, numbness and headaches.  Hematological: Negative.   Psychiatric/Behavioral:  Negative for behavioral problems, self-injury and suicidal ideas. The patient is not nervous/anxious.     Vital Signs: BP 126/78   Pulse 84   Temp 98.3 F (36.8 C)   Resp 16   Ht 5\' 5"  (1.651 m)   Wt 145 lb 6.4 oz (66 kg)   LMP 08/09/2014   SpO2 98%   BMI 24.20 kg/m    Physical Exam Vitals reviewed.  Constitutional:      General: She is awake. She is not in acute distress.    Appearance: Normal appearance. She is well-developed, well-groomed and normal weight. She is not ill-appearing or diaphoretic.  HENT:     Head: Normocephalic and atraumatic.     Right Ear: Tympanic membrane, ear canal and external ear normal.     Left Ear: Tympanic membrane, ear canal and external ear normal.     Nose: Nose normal. No congestion or rhinorrhea.     Mouth/Throat:     Lips: Pink.     Mouth: Mucous membranes are moist.     Pharynx: Oropharynx is clear. Uvula midline. No oropharyngeal exudate or posterior oropharyngeal erythema.  Eyes:      General: Lids are normal. Vision grossly intact. Gaze aligned appropriately. No scleral icterus.       Right eye: No discharge.        Left eye: No discharge.     Extraocular Movements: Extraocular movements intact.     Conjunctiva/sclera: Conjunctivae normal.     Pupils: Pupils are equal, round, and reactive to light.     Right eye: Pupil is not sluggish.     Left eye: Pupil is not sluggish.  Neck:     Thyroid: No thyromegaly.     Vascular: No JVD.     Trachea: Trachea and phonation normal. No tracheal deviation.  Cardiovascular:     Rate and Rhythm: Normal rate and regular rhythm.     Pulses: Normal  pulses.     Heart sounds: Normal heart sounds, S1 normal and S2 normal. No murmur heard.    No friction rub. No gallop.  Pulmonary:     Effort: Pulmonary effort is normal. No accessory muscle usage or respiratory distress.     Breath sounds: Normal breath sounds and air entry. No stridor. No wheezing or rales.  Chest:     Chest wall: No tenderness.  Breasts:    Breasts are symmetrical.     Right: Normal. No swelling, bleeding, inverted nipple, mass, nipple discharge, skin change or tenderness.     Left: Normal. No swelling, bleeding, inverted nipple, mass, nipple discharge, skin change or tenderness.  Abdominal:     General: Bowel sounds are normal. There is no distension.     Palpations: Abdomen is soft. There is no shifting dullness, fluid wave, mass or pulsatile mass.     Tenderness: There is no abdominal tenderness. There is no guarding or rebound.  Musculoskeletal:        General: No tenderness or deformity. Normal range of motion.     Cervical back: Normal range of motion and neck supple.     Right lower leg: No edema.     Left lower leg: No edema.  Lymphadenopathy:     Cervical: No cervical adenopathy.     Upper Body:     Right upper body: No supraclavicular, axillary or pectoral adenopathy.     Left upper body: No supraclavicular, axillary or pectoral adenopathy.   Skin:    General: Skin is warm and dry.     Capillary Refill: Capillary refill takes less than 2 seconds.     Coloration: Skin is not pale.     Findings: No erythema or rash.  Neurological:     Mental Status: She is alert and oriented to person, place, and time.     Cranial Nerves: No cranial nerve deficit.     Motor: No abnormal muscle tone.     Coordination: Coordination normal.     Gait: Gait normal.     Deep Tendon Reflexes: Reflexes are normal and symmetric.  Psychiatric:        Mood and Affect: Mood normal.        Behavior: Behavior normal. Behavior is cooperative.        Thought Content: Thought content normal.        Judgment: Judgment normal.       Assessment/Plan: 1. Encounter for routine adult health examination with abnormal findings Age-appropriate preventive screenings and vaccinations discussed, annual physical exam completed. Routine labs for health maintenance ordered, see below. PHM updated.   2. Vasomotor symptoms due to menopause Continue veozah as prescribed.   3. Essential hypertension Stable, no issues.   4. Mixed hyperlipidemia Routine lab ordered  - Lipid Profile  5. Pityriasis rosea Currently seeing dermatology for recurrent rash  6. Dysuria Routine urinalysis done  - UA/M w/rflx Culture, Routine - Microscopic Examination      General Counseling: arzu mcgaughey understanding of the findings of todays visit and agrees with plan of treatment. I have discussed any further diagnostic evaluation that may be needed or ordered today. We also reviewed her medications today. she has been encouraged to call the office with any questions or concerns that should arise related to todays visit.    Orders Placed This Encounter  Procedures   Microscopic Examination   UA/M w/rflx Culture, Routine   Lipid Profile    No orders of the defined types  were placed in this encounter.   Return in about 6 months (around 04/08/2023) for F/U, Paola Flynt  PCP.   Total time spent:30 Minutes Time spent includes review of chart, medications, test results, and follow up plan with the patient.   [place] Controlled Substance Database was reviewed by me.  This patient was seen by Sallyanne Kuster, FNP-C in collaboration with Dr. Beverely Risen as a part of collaborative care agreement.  Demarquez Ciolek R. Tedd Sias, MSN, FNP-C Internal medicine

## 2022-10-07 ENCOUNTER — Encounter: Payer: Self-pay | Admitting: Nurse Practitioner

## 2022-10-07 DIAGNOSIS — E782 Mixed hyperlipidemia: Secondary | ICD-10-CM | POA: Diagnosis not present

## 2022-10-07 LAB — UA/M W/RFLX CULTURE, ROUTINE
Bilirubin, UA: NEGATIVE
Glucose, UA: NEGATIVE
Ketones, UA: NEGATIVE
Leukocytes,UA: NEGATIVE
Nitrite, UA: NEGATIVE
Protein,UA: NEGATIVE
RBC, UA: NEGATIVE
Specific Gravity, UA: 1.019 (ref 1.005–1.030)
Urobilinogen, Ur: 1 mg/dL (ref 0.2–1.0)
pH, UA: 8 — ABNORMAL HIGH (ref 5.0–7.5)

## 2022-10-07 LAB — MICROSCOPIC EXAMINATION
Bacteria, UA: NONE SEEN
Casts: NONE SEEN /lpf
Epithelial Cells (non renal): NONE SEEN /hpf (ref 0–10)
WBC, UA: NONE SEEN /hpf (ref 0–5)

## 2022-10-08 LAB — LIPID PANEL
Chol/HDL Ratio: 3.1 ratio (ref 0.0–4.4)
Cholesterol, Total: 206 mg/dL — ABNORMAL HIGH (ref 100–199)
HDL: 66 mg/dL (ref >39)
LDL Chol Calc (NIH): 123 mg/dL — ABNORMAL HIGH (ref 0–99)
Triglycerides: 95 mg/dL (ref 0–149)
VLDL Cholesterol Cal: 17 mg/dL (ref 5–40)

## 2022-10-13 ENCOUNTER — Other Ambulatory Visit: Payer: Self-pay | Admitting: Internal Medicine

## 2022-10-13 DIAGNOSIS — Z1231 Encounter for screening mammogram for malignant neoplasm of breast: Secondary | ICD-10-CM

## 2022-10-14 ENCOUNTER — Telehealth: Payer: Self-pay | Admitting: Nurse Practitioner

## 2022-10-14 DIAGNOSIS — R5383 Other fatigue: Secondary | ICD-10-CM

## 2022-10-14 DIAGNOSIS — R630 Anorexia: Secondary | ICD-10-CM

## 2022-10-14 DIAGNOSIS — R5381 Other malaise: Secondary | ICD-10-CM

## 2022-10-14 NOTE — Progress Notes (Signed)
No significant change in cholesterol levels from last year, LDL still elevated at 123.

## 2022-10-14 NOTE — Telephone Encounter (Signed)
Error

## 2022-10-15 NOTE — Addendum Note (Signed)
Addended by: Sallyanne Kuster on: 10/15/2022 06:37 PM   Modules accepted: Orders

## 2022-10-16 DIAGNOSIS — R5381 Other malaise: Secondary | ICD-10-CM | POA: Diagnosis not present

## 2022-10-16 DIAGNOSIS — R5383 Other fatigue: Secondary | ICD-10-CM | POA: Diagnosis not present

## 2022-10-16 DIAGNOSIS — D509 Iron deficiency anemia, unspecified: Secondary | ICD-10-CM | POA: Diagnosis not present

## 2022-10-16 DIAGNOSIS — R630 Anorexia: Secondary | ICD-10-CM | POA: Diagnosis not present

## 2022-10-16 NOTE — Telephone Encounter (Signed)
Pt advised that labs ordered and make her follow up appt

## 2022-10-17 LAB — CBC WITH DIFFERENTIAL/PLATELET
Basophils Absolute: 0.1 10*3/uL (ref 0.0–0.2)
Basos: 1 %
EOS (ABSOLUTE): 0.2 10*3/uL (ref 0.0–0.4)
Eos: 4 %
Hematocrit: 38.9 % (ref 34.0–46.6)
Hemoglobin: 12.8 g/dL (ref 11.1–15.9)
Immature Grans (Abs): 0 10*3/uL (ref 0.0–0.1)
Immature Granulocytes: 0 %
Lymphocytes Absolute: 2 10*3/uL (ref 0.7–3.1)
Lymphs: 39 %
MCH: 31.3 pg (ref 26.6–33.0)
MCHC: 32.9 g/dL (ref 31.5–35.7)
MCV: 95 fL (ref 79–97)
Monocytes Absolute: 0.5 10*3/uL (ref 0.1–0.9)
Monocytes: 9 %
Neutrophils Absolute: 2.5 10*3/uL (ref 1.4–7.0)
Neutrophils: 47 %
Platelets: 268 10*3/uL (ref 150–450)
RBC: 4.09 x10E6/uL (ref 3.77–5.28)
RDW: 10.7 % — ABNORMAL LOW (ref 11.7–15.4)
WBC: 5.3 10*3/uL (ref 3.4–10.8)

## 2022-10-17 LAB — B12 AND FOLATE PANEL
Folate: >20 ng/mL (ref >3.0)
Vitamin B-12: 1402 pg/mL — ABNORMAL HIGH (ref 232–1245)

## 2022-10-17 LAB — CMP14+EGFR
ALT: 13 IU/L (ref 0–32)
AST: 19 IU/L (ref 0–40)
Albumin: 4.5 g/dL (ref 3.9–4.9)
Alkaline Phosphatase: 103 IU/L (ref 44–121)
BUN/Creatinine Ratio: 17 (ref 12–28)
BUN: 16 mg/dL (ref 8–27)
Bilirubin Total: 1.1 mg/dL (ref 0.0–1.2)
CO2: 26 mmol/L (ref 20–29)
Calcium: 9.8 mg/dL (ref 8.7–10.3)
Chloride: 105 mmol/L (ref 96–106)
Creatinine, Ser: 0.95 mg/dL (ref 0.57–1.00)
Globulin, Total: 2.5 g/dL (ref 1.5–4.5)
Glucose: 107 mg/dL — ABNORMAL HIGH (ref 70–99)
Potassium: 4.6 mmol/L (ref 3.5–5.2)
Sodium: 142 mmol/L (ref 134–144)
Total Protein: 7 g/dL (ref 6.0–8.5)
eGFR: 65 mL/min/{1.73_m2} (ref >59)

## 2022-10-17 LAB — MAGNESIUM: Magnesium: 2.2 mg/dL (ref 1.6–2.3)

## 2022-10-17 LAB — IRON,TIBC AND FERRITIN PANEL
Ferritin: 165 ng/mL — ABNORMAL HIGH (ref 15–150)
Iron Saturation: 33 % (ref 15–55)
Iron: 106 ug/dL (ref 27–139)
Total Iron Binding Capacity: 318 ug/dL (ref 250–450)
UIBC: 212 ug/dL (ref 118–369)

## 2022-10-17 LAB — SPECIMEN STATUS REPORT

## 2022-10-17 LAB — VITAMIN D 25 HYDROXY (VIT D DEFICIENCY, FRACTURES): Vit D, 25-Hydroxy: 75.1 ng/mL (ref 30.0–100.0)

## 2022-10-17 NOTE — Progress Notes (Signed)
We will discuss the results at her upcoming office visit

## 2022-10-22 DIAGNOSIS — R079 Chest pain, unspecified: Secondary | ICD-10-CM | POA: Diagnosis not present

## 2022-10-22 DIAGNOSIS — G309 Alzheimer's disease, unspecified: Secondary | ICD-10-CM | POA: Diagnosis not present

## 2022-10-22 DIAGNOSIS — Z79899 Other long term (current) drug therapy: Secondary | ICD-10-CM | POA: Diagnosis not present

## 2022-10-22 DIAGNOSIS — R0789 Other chest pain: Secondary | ICD-10-CM | POA: Diagnosis not present

## 2022-10-22 DIAGNOSIS — F0670 Mild neurocognitive disorder due to known physiological condition without behavioral disturbance: Secondary | ICD-10-CM | POA: Diagnosis not present

## 2022-10-26 ENCOUNTER — Ambulatory Visit (INDEPENDENT_AMBULATORY_CARE_PROVIDER_SITE_OTHER): Payer: Medicare HMO | Admitting: Nurse Practitioner

## 2022-10-26 ENCOUNTER — Encounter: Payer: Self-pay | Admitting: Nurse Practitioner

## 2022-10-26 VITALS — BP 125/70 | HR 77 | Temp 97.8°F | Resp 16 | Ht 65.0 in | Wt 145.0 lb

## 2022-10-26 DIAGNOSIS — R7309 Other abnormal glucose: Secondary | ICD-10-CM

## 2022-10-26 DIAGNOSIS — R5383 Other fatigue: Secondary | ICD-10-CM

## 2022-10-26 DIAGNOSIS — R630 Anorexia: Secondary | ICD-10-CM | POA: Diagnosis not present

## 2022-10-26 DIAGNOSIS — R079 Chest pain, unspecified: Secondary | ICD-10-CM | POA: Diagnosis not present

## 2022-10-26 LAB — POCT GLYCOSYLATED HEMOGLOBIN (HGB A1C): Hemoglobin A1C: 5.1 % (ref 4.0–5.6)

## 2022-10-26 NOTE — Progress Notes (Signed)
[facility] [address] [place], [place] [address]  Internal MEDICINE  Office Visit Note  Patient Name: [name] [name]  [id number]  [id number]  Date of Service: 10/26/2022  Chief Complaint  Patient presents with   Follow-up    Review labs    HPI [name] [name] presents for a follow-up visit to review lab results and discuss fatigue.  Reviewed labs with patients. Most of her labs were grossly normal.  Glucose was slightly elevated, A1c was checked and was normal.  No possible cause for her fatigue noted in her labs unless her glucose levels have been fluctuating more.  Other possible contributing factors include advancing age, menopause, medication side effects, diet, activity level and sleep quality.  Decreased appetite is more likely a medication side effect    Current Medication: Outpatient Encounter Medications as of 10/26/2022  Medication Sig   amLODipine (NORVASC) 5 MG tablet Take 1 tablet (5 mg total) by mouth daily.   aspirin 81 MG EC tablet Take 1 tablet by mouth daily.   Biotin 2.5 MG CAPS Take 1 capsule by mouth daily.   Cetirizine HCl 10 MG CAPS Take 1 capsule by mouth daily.   Cholecalciferol 25 MCG (1000 UT) tablet Take 1 tablet by mouth 1 day or 1 dose.   Clobetasol Propionate 0.05 % shampoo Use as directed twice weekly prn   clotrimazole-betamethasone (LOTRISONE) cream Apply 1 Application topically 2 (two) times daily.   fluticasone (FLONASE) 50 MCG/ACT nasal spray PLACE 2 SPRAYS INTO BOTH NOSTRILS AS NEEDED.   memantine (NAMENDA) 10 MG tablet TAKE 1 TABLET BY MOUTH TWICE A DAY   Multiple Vitamins-Minerals (CENTRUM SILVER) tablet Take 1 tablet by mouth daily.   MYRBETRIQ 50 MG TB24 tablet TAKE 1 TABLET BY MOUTH EVERY DAY   pantoprazole (PROTONIX) 40 MG tablet TAKE 1 TABLET BY MOUTH EVERY DAY   triamcinolone lotion (KENALOG) 0.1 % APPLY TO AFFECTED AREA EVERY DAY AS NEEDED   VEOZAH 45 MG TABS TAKE 1 TABLET BY MOUTH EVERY DAY   escitalopram (LEXAPRO) 5 MG  tablet Take 1 tablet by mouth daily.   No facility-administered encounter medications on file as of 10/26/2022.    Surgical History: Past Surgical History:  Procedure Laterality Date   BREAST BIOPSY Right 2016   cor bx   BENIGN BREAST LOBULES AND FIBROADIPOSE TISSUE    COLONOSCOPY WITH PROPOFOL N/A 10/03/2020   Procedure: COLONOSCOPY WITH PROPOFOL;  Surgeon: Wyline Mood, MD;  Location: [facility] ENDOSCOPY;  Service: Gastroenterology;  Laterality: N/A;   ruptured disc  1992    Medical History: Past Medical History:  Diagnosis Date   Allergic rhinitis    Depression    GERD (gastroesophageal reflux disease)    Hypertension    Postmenopausal disorder     Family History: Family History  Problem Relation Age of Onset   Asthma Mother    Diabetes Mother    Hypertension Mother    Stroke Mother    Epilepsy Mother    Coronary artery disease Mother    Breast cancer Neg Hx     Social History   Socioeconomic History   Marital status: Married    Spouse name: Not on file   Number of children: Not on file   Years of education: Not on file   Highest education level: Not on file  Occupational History   Not on file  Tobacco Use   Smoking status: Never   Smokeless tobacco: Never  Vaping Use   Vaping status: Never Used  Substance and Sexual Activity   Alcohol use: Yes    Comment: holidays, very rarely   Drug use: Never   Sexual activity: Not on file  Other Topics Concern   Not on file  Social History Narrative   Not on file   Social Determinants of Health   Financial Resource Strain: Low Risk  (08/01/2020)   Overall Financial Resource Strain (CARDIA)    Difficulty of Paying Living Expenses: Not very hard  Food Insecurity: Not on file  Transportation Needs: Not on file  Physical Activity: Not on file  Stress: Not on file  Social Connections: Not on file  Intimate Partner Violence: Not on file      Review of Systems  Constitutional:  Positive for appetite change,  diaphoresis (improving) and fatigue (improving). Negative for fever.  HENT:  Negative for congestion, mouth sores and postnasal drip.   Respiratory: Negative.  Negative for cough, chest tightness, shortness of breath and wheezing.   Cardiovascular: Negative.  Negative for chest pain and palpitations.  Gastrointestinal: Negative.   Endocrine: Positive for heat intolerance (improing).  Genitourinary:        Night sweats and hot flashes  Neurological:  Negative for weakness.  Psychiatric/Behavioral:  Positive for sleep disturbance (improving).     Vital Signs: BP 125/70   Pulse 77   Temp 97.8 F (36.6 C)   Resp 16   Ht 5\' 5"  (1.651 m)   Wt 145 lb (65.8 kg)   LMP 08/09/2014   SpO2 97%   BMI 24.13 kg/m    Physical Exam Vitals reviewed.  Constitutional:      General: She is not in acute distress.    Appearance: Normal appearance. She is not ill-appearing.  HENT:     Head: Normocephalic and atraumatic.  Eyes:     Pupils: Pupils are equal, round, and reactive to light.  Pulmonary:     Effort: Pulmonary effort is normal. No respiratory distress.  Neurological:     Mental Status: She is alert and oriented to person, place, and time.  Psychiatric:        Mood and Affect: Mood normal.        Behavior: Behavior normal.        Assessment/Plan: 1. Abnormal glucose A1c was normal but her glucose could be fluctuating more if she is eating high carb and high sugar foods. Which could be causing a fatigue  - POCT HgB A1C  2. Other fatigue Decrease high carb and high sugar foods, if not helping, call clinic and we will discuss further.  3. Lack of appetite Possible side effect of medication   General Counseling: [name] [name] verbalizes understanding of the findings of todays visit and agrees with plan of treatment. I have discussed any further diagnostic evaluation that may be needed or ordered today. We also reviewed her medications today. she has been encouraged to call the office  with any questions or concerns that should arise related to todays visit.    Orders Placed This Encounter  Procedures   POCT HgB A1C    No orders of the defined types were placed in this encounter.   Return if symptoms worsen or fail to improve.   Total time spent:30 Minutes Time spent includes review of chart, medications, test results, and follow up plan with the patient.   [place] Controlled Substance Database was reviewed by me.  This patient was seen by Sallyanne Kuster, FNP-C in collaboration with Dr. Beverely Risen as a part of collaborative  care agreement.    R. Tedd Sias, MSN, FNP-C Internal medicine

## 2022-10-31 ENCOUNTER — Encounter: Payer: Self-pay | Admitting: Nurse Practitioner

## 2022-11-05 DIAGNOSIS — F0670 Mild neurocognitive disorder due to known physiological condition without behavioral disturbance: Secondary | ICD-10-CM | POA: Diagnosis not present

## 2022-11-05 DIAGNOSIS — G309 Alzheimer's disease, unspecified: Secondary | ICD-10-CM | POA: Diagnosis not present

## 2022-11-19 DIAGNOSIS — G309 Alzheimer's disease, unspecified: Secondary | ICD-10-CM | POA: Diagnosis not present

## 2022-11-19 DIAGNOSIS — F0670 Mild neurocognitive disorder due to known physiological condition without behavioral disturbance: Secondary | ICD-10-CM | POA: Diagnosis not present

## 2022-11-29 ENCOUNTER — Other Ambulatory Visit: Payer: Self-pay | Admitting: Nurse Practitioner

## 2022-12-03 DIAGNOSIS — F0670 Mild neurocognitive disorder due to known physiological condition without behavioral disturbance: Secondary | ICD-10-CM | POA: Diagnosis not present

## 2022-12-03 DIAGNOSIS — G309 Alzheimer's disease, unspecified: Secondary | ICD-10-CM | POA: Diagnosis not present

## 2022-12-04 ENCOUNTER — Other Ambulatory Visit: Payer: Self-pay | Admitting: Nurse Practitioner

## 2022-12-04 DIAGNOSIS — L409 Psoriasis, unspecified: Secondary | ICD-10-CM

## 2022-12-04 MED ORDER — CLOBETASOL PROPIONATE 0.05 % EX SHAM: MEDICATED_SHAMPOO | CUTANEOUS | 5 refills | Status: AC

## 2022-12-05 ENCOUNTER — Other Ambulatory Visit: Payer: Self-pay

## 2022-12-05 ENCOUNTER — Emergency Department
Admission: EM | Admit: 2022-12-05 | Discharge: 2022-12-05 | Disposition: A | Discharge status: Home / Self Care | Payer: Medicare HMO | Attending: Emergency Medicine | Admitting: Emergency Medicine

## 2022-12-05 DIAGNOSIS — M79605 Pain in left leg: Secondary | ICD-10-CM | POA: Insufficient documentation

## 2022-12-05 NOTE — Discharge Instructions (Signed)
You were seen in the emergency department today for localized pain and swelling of your leg.  I suspect that this is likely related to some minor trauma causing an area of swelling with bruising.  You can use ice and over-the-counter medicines to help with pain and swelling.  Return to the ER for new or worsening symptoms.

## 2022-12-05 NOTE — ED Triage Notes (Addendum)
Pt to ed from home via POV for an abnormal swelling to her lower tib fib on the lateral side. Pt has no redness or warmth. Pt advised it is painful to the touch. Pt is caox4, in no acute distress and ambulatory in triage. Pt just noticed it today as she was sitting and crossed her legs. Pt denies any injuries.

## 2022-12-05 NOTE — ED Provider Notes (Signed)
[facility] Provider Note    Event Date/Time   First MD Initiated Contact with Patient 12/05/22 1645     (approximate)   History   Leg Swelling (Left)   HPI  [name] [name] is a 67 year old female presenting to the emergency department for left leg pain and swelling.  Earlier today patient was working when she noticed that she had some swelling along the lateral aspect of her left lower leg.  Reports associated tenderness to palpation.  Does not recall any recent injury.  Did notice some pain over this area when she was walking afterwards.  No redness or warmth.  No fevers.  No history of similar.  No reported history of blood clots.  Does feel that the swelling is actually significantly improved at the time of my evaluation.    Physical Exam   Triage Vital Signs: ED Triage Vitals [12/05/22 1613]  Encounter Vitals Group     BP 136/74     Systolic BP Percentile      Diastolic BP Percentile      Pulse Rate 80     Resp 16     Temp 98 F (36.7 C)     Temp Source Oral     SpO2 98 %     Weight 142 lb (64.4 kg)     Height 5\' 5"  (1.651 m)     Head Circumference      Peak Flow      Pain Score 5     Pain Loc      Pain Education      Exclude from Growth Chart     Most recent vital signs: Vitals:   12/05/22 1613  BP: 136/74  Pulse: 80  Resp: 16  Temp: 98 F (36.7 C)  SpO2: 98%     General: Awake, interactive  CV:  Regular rate, good peripheral perfusion.  Resp:  Unlabored respirations Abd:  Nondistended Neuro:  Symmetric facial movement, fluid speech, normal gait MSK:  There is a well-circumscribed area of mild swelling over the left lateral leg proximal to the ankle approximately 3 x 5 cm in size.  There is tenderness over this area with faint overlying ecchymosis.  There is no tenderness beyond the localized area of swelling.  There is no tenderness over the adjacent bone.  Patient has full range of motion of her ankle without pain.  She is  able to bear weight without difficulty.   ED Results / Procedures / Treatments   Labs (all labs ordered are listed, but only abnormal results are displayed) Labs Reviewed - No data to display   EKG EKG independently reviewed interpreted by myself (ER attending) demonstrates:    RADIOLOGY Imaging independently reviewed and interpreted by myself demonstrates:    PROCEDURES:  Critical Care performed: No  Procedures   MEDICATIONS ORDERED IN ED: Medications - No data to display   IMPRESSION / MDM / ASSESSMENT AND PLAN / ED COURSE  I reviewed the triage vital signs and the nursing notes.  Differential diagnosis includes, but is not limited to, minor trauma with localized swelling and pain, very low suspicion for DVT based on clinical presentation, consideration for fracture though overall lower suspicion, clinical presentation not reflective of cellulitis, abscess  Patient's presentation is most consistent with acute, uncomplicated illness.  67 year old female presenting with localized swelling and pain on her leg.  Her exam seems most consistent with minor trauma.  There is faint ecchymosis over the affected area  with surrounding area nontender without overlying skin changes.  She has no bony tenderness.  Overall low suspicion for fracture, particularly in the absence of any significant identifiable trauma.  Clinical history also not suggestive of DVT.  I did discuss possible x-Maylynn Orzechowski or ultrasound, but patient is comfortable with holding off on imaging which I do think is reasonable.  Strict return precautions were provided.  Patient was discharged stable condition.   FINAL CLINICAL IMPRESSION(S) / ED DIAGNOSES   Final diagnoses:  Left leg pain     Rx / DC Orders   ED Discharge Orders     None        Note:  This document was prepared using Dragon voice recognition software and may include unintentional dictation errors.   Trinna Post, MD 12/05/22 [phone]

## 2022-12-07 ENCOUNTER — Other Ambulatory Visit: Payer: Self-pay | Admitting: Nurse Practitioner

## 2022-12-07 NOTE — Group Note (Deleted)

## 2022-12-14 ENCOUNTER — Ambulatory Visit: Payer: Medicare HMO | Admitting: Nurse Practitioner

## 2022-12-15 ENCOUNTER — Encounter: Payer: Self-pay | Admitting: Nurse Practitioner

## 2022-12-15 ENCOUNTER — Ambulatory Visit (INDEPENDENT_AMBULATORY_CARE_PROVIDER_SITE_OTHER): Payer: Medicare HMO | Admitting: Nurse Practitioner

## 2022-12-15 VITALS — BP 130/76 | HR 74 | Temp 96.7°F | Resp 16 | Ht 65.0 in | Wt 147.4 lb

## 2022-12-15 DIAGNOSIS — S8012XA Contusion of left lower leg, initial encounter: Secondary | ICD-10-CM

## 2022-12-15 DIAGNOSIS — N3281 Overactive bladder: Secondary | ICD-10-CM

## 2022-12-15 DIAGNOSIS — N951 Menopausal and female climacteric states: Secondary | ICD-10-CM

## 2022-12-15 MED ORDER — MIRABEGRON ER 50 MG PO TB24
50.0000 mg | ORAL_TABLET | Freq: Every day | ORAL | 3 refills | Status: DC
Start: 2022-12-15 — End: 2023-06-11

## 2022-12-15 NOTE — Progress Notes (Signed)
[facility] [address] [place], [place] [address]  Internal MEDICINE  Office Visit Note  Patient Name: [name] [name]  [id number]  [id number]  Date of Service: 12/15/2022  Chief Complaint  Patient presents with   Depression   Gastroesophageal Reflux   Hypertension   Follow-up    ED f/u left leg pain     HPI [name] [name] presents for a follow-up visit for ED visit for left leg pain Lower left leg with swelling and small hematoma.  After hitting leg on cabinet a few weeks ago. Still sore, not completely resorbed.  Needs refills of myrbetriq     Current Medication: Outpatient Encounter Medications as of 12/15/2022  Medication Sig   amLODipine (NORVASC) 5 MG tablet Take 1 tablet (5 mg total) by mouth daily.   aspirin 81 MG EC tablet Take 1 tablet by mouth daily.   Biotin 2.5 MG CAPS Take 1 capsule by mouth daily.   Cetirizine HCl 10 MG CAPS Take 1 capsule by mouth daily.   Cholecalciferol 25 MCG (1000 UT) tablet Take 1 tablet by mouth 1 day or 1 dose.   Clobetasol Propionate 0.05 % shampoo USE AS DIRECTED TWICE WEEKLY AS NEEDED   clotrimazole-betamethasone (LOTRISONE) cream Apply 1 Application topically 2 (two) times daily.   fluticasone (FLONASE) 50 MCG/ACT nasal spray PLACE 2 SPRAYS INTO BOTH NOSTRILS AS NEEDED.   memantine (NAMENDA) 10 MG tablet TAKE 1 TABLET BY MOUTH TWICE A DAY   Multiple Vitamins-Minerals (CENTRUM SILVER) tablet Take 1 tablet by mouth daily.   pantoprazole (PROTONIX) 40 MG tablet TAKE 1 TABLET BY MOUTH EVERY DAY   triamcinolone lotion (KENALOG) 0.1 % APPLY TO AFFECTED AREA EVERY DAY AS NEEDED   VEOZAH 45 MG TABS TAKE 1 TABLET BY MOUTH EVERY DAY   [DISCONTINUED] MYRBETRIQ 50 MG TB24 tablet TAKE 1 TABLET BY MOUTH EVERY DAY   escitalopram (LEXAPRO) 5 MG tablet Take 1 tablet by mouth daily.   mirabegron ER (MYRBETRIQ) 50 MG TB24 tablet Take 1 tablet (50 mg total) by mouth daily.   No facility-administered encounter medications on file as of  12/15/2022.    Surgical History: Past Surgical History:  Procedure Laterality Date   BREAST BIOPSY Right 2016   cor bx   BENIGN BREAST LOBULES AND FIBROADIPOSE TISSUE    COLONOSCOPY WITH PROPOFOL N/A 10/03/2020   Procedure: COLONOSCOPY WITH PROPOFOL;  Surgeon: Wyline Mood, MD;  Location: [facility] END[facility];  Service: Gastroenterology;  Laterality: N/A;   ruptured disc  1992    Medical History: Past Medical History:  Diagnosis Date   Allergic rhinitis    Depression    GERD (gastroesophageal reflux disease)    Hypertension    Postmenopausal disorder     Family History: Family History  Problem Relation Age of Onset   Asthma Mother    Diabetes Mother    Hypertension Mother    Stroke Mother    Epilepsy Mother    Coronary artery disease Mother    Breast cancer Neg Hx     Social History   Socioeconomic History   Marital status: Married    Spouse name: Not on file   Number of children: Not on file   Years of education: Not on file   Highest education level: Not on file  Occupational History   Not on file  Tobacco Use   Smoking status: Never   Smokeless tobacco: Never  Vaping Use   Vaping status: Never Used  Substance and Sexual Activity  Alcohol use: Yes    Comment: holidays, very rarely   Drug use: Never   Sexual activity: Not on file  Other Topics Concern   Not on file  Social History Narrative   Not on file   Social Determinants of Health   Financial Resource Strain: Low Risk  (08/01/2020)   Overall Financial Resource Strain (CARDIA)    Difficulty of Paying Living Expenses: Not very hard  Food Insecurity: Not on file  Transportation Needs: Not on file  Physical Activity: Not on file  Stress: Not on file  Social Connections: Not on file  Intimate Partner Violence: Not on file      Review of Systems  Constitutional:  Positive for diaphoresis (improving) and fatigue (improving). Negative for fever.  HENT:  Negative for congestion, mouth sores and postnasal  drip.   Respiratory: Negative.  Negative for cough, chest tightness, shortness of breath and wheezing.   Cardiovascular: Negative.  Negative for chest pain and palpitations.  Gastrointestinal: Negative.   Endocrine: Positive for heat intolerance (improing).  Genitourinary:        Night sweats and hot flashes  Musculoskeletal:        Left leg pain  Skin:  Positive for rash (scattered slightly itchy areas of hypopigmentation. , see HPI).  Neurological:  Negative for weakness.  Psychiatric/Behavioral:  Positive for sleep disturbance (improving).     Vital Signs: BP 130/76   Pulse 74   Temp (!) 96.7 F (35.9 C)   Resp 16   Ht 5\' 5"  (1.651 m)   Wt 147 lb 6.4 oz (66.9 kg)   LMP 08/09/2014   SpO2 99%   BMI 24.53 kg/m    Physical Exam Vitals reviewed.  Constitutional:      General: She is not in acute distress.    Appearance: Normal appearance. She is not ill-appearing.  HENT:     Head: Normocephalic and atraumatic.  Eyes:     Pupils: Pupils are equal, round, and reactive to light.  Pulmonary:     Effort: Pulmonary effort is normal. No respiratory distress.  Neurological:     Mental Status: She is alert and oriented to person, place, and time.  Psychiatric:        Mood and Affect: Mood normal.        Behavior: Behavior normal.        Assessment/Plan: 1. Hematoma of left lower leg Use warm compresses periodically. Will take time to reabsorb.   2. Overactive bladder Continue myrbetriq as prescribed  - mirabegron ER (MYRBETRIQ) 50 MG TB24 tablet; Take 1 tablet (50 mg total) by mouth daily.  Dispense: 30 tablet; Refill: 3  3. Vasomotor symptoms due to menopause Continue veozah as prescribed    General Counseling: [name] [name] understanding of the findings of todays visit and agrees with plan of treatment. I have discussed any further diagnostic evaluation that may be needed or ordered today. We also reviewed her medications today. she has been encouraged to call  the office with any questions or concerns that should arise related to todays visit.    No orders of the defined types were placed in this encounter.   Meds ordered this encounter  Medications   mirabegron ER (MYRBETRIQ) 50 MG TB24 tablet    Sig: Take 1 tablet (50 mg total) by mouth daily.    Dispense:  30 tablet    Refill:  3    Return if symptoms worsen or fail to improve.   Total time spent:30  Minutes Time spent includes review of chart, medications, test results, and follow up plan with the patient.   [place] Controlled Substance Database was reviewed by me.  This patient was seen by Sallyanne Kuster, FNP-C in collaboration with Dr. Beverely Risen as a part of collaborative care agreement.   Aubriauna Riner R. Tedd Sias, MSN, FNP-C Internal medicine

## 2022-12-19 DIAGNOSIS — G309 Alzheimer's disease, unspecified: Secondary | ICD-10-CM | POA: Diagnosis not present

## 2022-12-19 DIAGNOSIS — F0670 Mild neurocognitive disorder due to known physiological condition without behavioral disturbance: Secondary | ICD-10-CM | POA: Diagnosis not present

## 2022-12-19 DIAGNOSIS — Z79899 Other long term (current) drug therapy: Secondary | ICD-10-CM | POA: Diagnosis not present

## 2022-12-22 DIAGNOSIS — F0670 Mild neurocognitive disorder due to known physiological condition without behavioral disturbance: Secondary | ICD-10-CM | POA: Diagnosis not present

## 2022-12-22 DIAGNOSIS — G309 Alzheimer's disease, unspecified: Secondary | ICD-10-CM | POA: Diagnosis not present

## 2023-01-07 DIAGNOSIS — G309 Alzheimer's disease, unspecified: Secondary | ICD-10-CM | POA: Diagnosis not present

## 2023-01-07 DIAGNOSIS — F0670 Mild neurocognitive disorder due to known physiological condition without behavioral disturbance: Secondary | ICD-10-CM | POA: Diagnosis not present

## 2023-01-11 DIAGNOSIS — G309 Alzheimer's disease, unspecified: Secondary | ICD-10-CM | POA: Diagnosis not present

## 2023-01-11 DIAGNOSIS — F0670 Mild neurocognitive disorder due to known physiological condition without behavioral disturbance: Secondary | ICD-10-CM | POA: Diagnosis not present

## 2023-01-12 ENCOUNTER — Ambulatory Visit
Admission: RE | Admit: 2023-01-12 | Discharge: 2023-01-12 | Disposition: A | Discharge status: Home / Self Care | Payer: Medicare HMO | Source: Ambulatory Visit | Attending: Internal Medicine | Admitting: Internal Medicine

## 2023-01-12 ENCOUNTER — Other Ambulatory Visit: Payer: Self-pay | Admitting: Nurse Practitioner

## 2023-01-12 DIAGNOSIS — Z1231 Encounter for screening mammogram for malignant neoplasm of breast: Secondary | ICD-10-CM | POA: Diagnosis not present

## 2023-01-12 DIAGNOSIS — J309 Allergic rhinitis, unspecified: Secondary | ICD-10-CM

## 2023-01-21 DIAGNOSIS — F0670 Mild neurocognitive disorder due to known physiological condition without behavioral disturbance: Secondary | ICD-10-CM | POA: Diagnosis not present

## 2023-01-21 DIAGNOSIS — G309 Alzheimer's disease, unspecified: Secondary | ICD-10-CM | POA: Diagnosis not present

## 2023-02-04 DIAGNOSIS — F0670 Mild neurocognitive disorder due to known physiological condition without behavioral disturbance: Secondary | ICD-10-CM | POA: Diagnosis not present

## 2023-02-04 DIAGNOSIS — G309 Alzheimer's disease, unspecified: Secondary | ICD-10-CM | POA: Diagnosis not present

## 2023-02-07 ENCOUNTER — Encounter: Payer: Self-pay | Admitting: Nurse Practitioner

## 2023-02-16 ENCOUNTER — Encounter: Payer: Self-pay | Admitting: Nurse Practitioner

## 2023-02-16 ENCOUNTER — Ambulatory Visit: Payer: Medicare HMO | Admitting: Nurse Practitioner

## 2023-02-16 VITALS — BP 124/70 | HR 75 | Temp 97.7°F | Resp 16 | Ht 65.0 in | Wt 146.2 lb

## 2023-02-16 DIAGNOSIS — L930 Discoid lupus erythematosus: Secondary | ICD-10-CM | POA: Diagnosis not present

## 2023-02-16 DIAGNOSIS — M79604 Pain in right leg: Secondary | ICD-10-CM | POA: Diagnosis not present

## 2023-02-16 DIAGNOSIS — N3281 Overactive bladder: Secondary | ICD-10-CM | POA: Diagnosis not present

## 2023-02-16 MED ORDER — BETAMETHASONE VALERATE 0.1 % EX LOTN: 1.0000 | TOPICAL_LOTION | Freq: Every day | CUTANEOUS | 3 refills | Status: DC | PRN

## 2023-02-16 NOTE — Progress Notes (Signed)
[facility] [address] [place], [place] [address]  Internal MEDICINE  Office Visit Note  Patient Name: [name] [name]  [id number]  [id number]  Date of Service: 02/16/2023  Chief Complaint  Patient presents with   Acute Visit    Right leg pain     HPI Kamden presents for an acute sick visit for right leg pain, overactive bladder and hot flashes.  -- onset of right leg pain was about a week ago, might have hit leg on something, also does aerobic/dance classes. Notices that it gets more sore when she is cleaning all day.  -- discoid lupus Overactive bladder -- on max dose myrbetriq  Veozah -- working well, patient wants to continue.       Current Medication:  Outpatient Encounter Medications as of 02/16/2023  Medication Sig   amLODipine (NORVASC) 5 MG tablet Take 1 tablet (5 mg total) by mouth daily.   aspirin 81 MG EC tablet Take 1 tablet by mouth daily.   betamethasone valerate lotion (VALISONE) 0.1 % Apply 1 Application topically daily as needed for irritation (rash or itching). To scalp   Biotin 2.5 MG CAPS Take 1 capsule by mouth daily.   Cetirizine HCl 10 MG CAPS Take 1 capsule by mouth daily.   Cholecalciferol 25 MCG (1000 UT) tablet Take 1 tablet by mouth 1 day or 1 dose.   Clobetasol Propionate 0.05 % shampoo USE AS DIRECTED TWICE WEEKLY AS NEEDED   clotrimazole-betamethasone (LOTRISONE) cream Apply 1 Application topically 2 (two) times daily.   fluticasone (FLONASE) 50 MCG/ACT nasal spray PLACE 2 SPRAYS INTO BOTH NOSTRILS AS NEEDED.   memantine (NAMENDA) 10 MG tablet TAKE 1 TABLET BY MOUTH TWICE A DAY   mirabegron ER (MYRBETRIQ) 50 MG TB24 tablet Take 1 tablet (50 mg total) by mouth daily.   Multiple Vitamins-Minerals (CENTRUM SILVER) tablet Take 1 tablet by mouth daily.   pantoprazole (PROTONIX) 40 MG tablet TAKE 1 TABLET BY MOUTH EVERY DAY   VEOZAH 45 MG TABS TAKE 1 TABLET BY MOUTH EVERY DAY   [DISCONTINUED] triamcinolone lotion (KENALOG) 0.1 %  APPLY TO AFFECTED AREA EVERY DAY AS NEEDED   escitalopram (LEXAPRO) 5 MG tablet Take 1 tablet by mouth daily.   No facility-administered encounter medications on file as of 02/16/2023.      Medical History: Past Medical History:  Diagnosis Date   Allergic rhinitis    Depression    GERD (gastroesophageal reflux disease)    Hypertension    Postmenopausal disorder      Vital Signs: BP 124/70   Pulse 75   Temp 97.7 F (36.5 C)   Resp 16   Ht 5\' 5"  (1.651 m)   Wt 146 lb 3.2 oz (66.3 kg)   LMP 08/09/2014   SpO2 98%   BMI 24.33 kg/m    Review of Systems  Constitutional: Negative.  Negative for fatigue.  Respiratory: Negative.  Negative for cough, chest tightness, shortness of breath and wheezing.   Cardiovascular: Negative.  Negative for chest pain and palpitations.  Musculoskeletal:  Positive for arthralgias (right leg pain).    Physical Exam Vitals reviewed.  Constitutional:      General: She is not in acute distress.    Appearance: Normal appearance. She is not ill-appearing.  HENT:     Head: Normocephalic and atraumatic.  Eyes:     Pupils: Pupils are equal, round, and reactive to light.  Cardiovascular:     Rate and Rhythm: Normal rate and regular rhythm.  Pulmonary:     Effort: Pulmonary effort is normal. No respiratory distress.  Neurological:     Mental Status: She is alert and oriented to person, place, and time.  Psychiatric:        Mood and Affect: Mood normal.        Behavior: Behavior normal.       Assessment/Plan: 1. Right leg pain (Primary) No interventions at this time, pain is improving and beginning to resolve. Call clinic if pain worsens.   2. Discoid lupus erythematosus Topical prescription sent to pharmacy  3. Overactive bladder Continue myrbetriq at as prescribed    General Counseling: Letitia Neri understanding of the findings of todays visit and agrees with plan of treatment. I have discussed any further diagnostic  evaluation that may be needed or ordered today. We also reviewed her medications today. she has been encouraged to call the office with any questions or concerns that should arise related to todays visit.    Counseling:    No orders of the defined types were placed in this encounter.   Meds ordered this encounter  Medications   betamethasone valerate lotion (VALISONE) 0.1 %    Sig: Apply 1 Application topically daily as needed for irritation (rash or itching). To scalp    Dispense:  120 mL    Refill:  3    Return in about 2 months (around 04/18/2023) for F/U, Raschelle Wisenbaker PCP [facility] and med review.  [place] Controlled Substance Database was reviewed by me for overdose risk score (ORS)  Time spent:30 Minutes Time spent with patient included reviewing progress notes, labs, imaging studies, and discussing plan for follow up.   This patient was seen by Sallyanne Kuster, FNP-C in collaboration with Dr. Beverely Risen as a part of collaborative care agreement.  Kennedy Brines R. Tedd Sias, MSN, FNP-C Internal Medicine

## 2023-02-18 DIAGNOSIS — F0670 Mild neurocognitive disorder due to known physiological condition without behavioral disturbance: Secondary | ICD-10-CM | POA: Diagnosis not present

## 2023-02-18 DIAGNOSIS — G309 Alzheimer's disease, unspecified: Secondary | ICD-10-CM | POA: Diagnosis not present

## 2023-02-22 ENCOUNTER — Ambulatory Visit: Payer: Medicare HMO | Admitting: Nurse Practitioner

## 2023-02-23 ENCOUNTER — Other Ambulatory Visit: Payer: Self-pay | Admitting: Nurse Practitioner

## 2023-02-23 ENCOUNTER — Telehealth: Payer: Self-pay

## 2023-02-23 MED ORDER — BETAMETHASONE DIPROPIONATE AUG 0.05 % EX CREA: TOPICAL_CREAM | Freq: Two times a day (BID) | CUTANEOUS | 1 refills | Status: AC

## 2023-02-23 NOTE — Telephone Encounter (Signed)
Let patient know that [facility] has sent in Cordran cream instead of lotion, since the lotion contains alcohol.

## 2023-03-04 DIAGNOSIS — F0670 Mild neurocognitive disorder due to known physiological condition without behavioral disturbance: Secondary | ICD-10-CM | POA: Diagnosis not present

## 2023-03-04 DIAGNOSIS — G309 Alzheimer's disease, unspecified: Secondary | ICD-10-CM | POA: Diagnosis not present

## 2023-03-09 ENCOUNTER — Other Ambulatory Visit: Payer: Self-pay | Admitting: Nurse Practitioner

## 2023-03-09 NOTE — Telephone Encounter (Signed)
Please review and send 

## 2023-03-10 ENCOUNTER — Telehealth: Payer: Self-pay | Admitting: Nurse Practitioner

## 2023-03-10 NOTE — Telephone Encounter (Signed)
03/30/21-present MR mailed to [facility]; Attn: [facility], [address], [place], [place] [address]

## 2023-03-18 DIAGNOSIS — G309 Alzheimer's disease, unspecified: Secondary | ICD-10-CM | POA: Diagnosis not present

## 2023-03-18 DIAGNOSIS — F0670 Mild neurocognitive disorder due to known physiological condition without behavioral disturbance: Secondary | ICD-10-CM | POA: Diagnosis not present

## 2023-03-31 ENCOUNTER — Other Ambulatory Visit: Payer: Self-pay | Admitting: Nurse Practitioner

## 2023-04-01 DIAGNOSIS — F0670 Mild neurocognitive disorder due to known physiological condition without behavioral disturbance: Secondary | ICD-10-CM | POA: Diagnosis not present

## 2023-04-01 DIAGNOSIS — G309 Alzheimer's disease, unspecified: Secondary | ICD-10-CM | POA: Diagnosis not present

## 2023-04-06 ENCOUNTER — Ambulatory Visit: Payer: Medicare Other | Admitting: Nurse Practitioner

## 2023-04-07 DIAGNOSIS — F0670 Mild neurocognitive disorder due to known physiological condition without behavioral disturbance: Secondary | ICD-10-CM | POA: Diagnosis not present

## 2023-04-07 DIAGNOSIS — G309 Alzheimer's disease, unspecified: Secondary | ICD-10-CM | POA: Diagnosis not present

## 2023-04-15 DIAGNOSIS — G309 Alzheimer's disease, unspecified: Secondary | ICD-10-CM | POA: Diagnosis not present

## 2023-04-15 DIAGNOSIS — F0670 Mild neurocognitive disorder due to known physiological condition without behavioral disturbance: Secondary | ICD-10-CM | POA: Diagnosis not present

## 2023-04-20 ENCOUNTER — Ambulatory Visit: Payer: Medicare Other | Admitting: Nurse Practitioner

## 2023-04-22 ENCOUNTER — Other Ambulatory Visit: Payer: Self-pay | Admitting: Nurse Practitioner

## 2023-04-23 DIAGNOSIS — G309 Alzheimer's disease, unspecified: Secondary | ICD-10-CM | POA: Diagnosis not present

## 2023-04-23 DIAGNOSIS — R413 Other amnesia: Secondary | ICD-10-CM | POA: Diagnosis not present

## 2023-04-29 DIAGNOSIS — F0670 Mild neurocognitive disorder due to known physiological condition without behavioral disturbance: Secondary | ICD-10-CM | POA: Diagnosis not present

## 2023-04-29 DIAGNOSIS — G309 Alzheimer's disease, unspecified: Secondary | ICD-10-CM | POA: Diagnosis not present

## 2023-05-02 ENCOUNTER — Encounter: Payer: Self-pay | Admitting: Nurse Practitioner

## 2023-05-04 ENCOUNTER — Ambulatory Visit: Payer: Medicare Other | Admitting: Nurse Practitioner

## 2023-05-13 DIAGNOSIS — F0670 Mild neurocognitive disorder due to known physiological condition without behavioral disturbance: Secondary | ICD-10-CM | POA: Diagnosis not present

## 2023-05-13 DIAGNOSIS — G309 Alzheimer's disease, unspecified: Secondary | ICD-10-CM | POA: Diagnosis not present

## 2023-05-26 ENCOUNTER — Encounter: Payer: Self-pay | Admitting: Nurse Practitioner

## 2023-05-26 ENCOUNTER — Ambulatory Visit: Payer: Medicare Other | Admitting: Nurse Practitioner

## 2023-05-26 VITALS — BP 118/74 | HR 85 | Temp 98.4°F | Resp 16 | Ht 65.0 in | Wt 147.8 lb

## 2023-05-26 DIAGNOSIS — J0110 Acute frontal sinusitis, unspecified: Secondary | ICD-10-CM | POA: Diagnosis not present

## 2023-05-26 DIAGNOSIS — R110 Nausea: Secondary | ICD-10-CM | POA: Diagnosis not present

## 2023-05-26 MED ORDER — ONDANSETRON HCL 4 MG PO TABS: 4.0000 mg | ORAL_TABLET | Freq: Three times a day (TID) | ORAL | 0 refills | Status: AC | PRN

## 2023-05-26 MED ORDER — AZITHROMYCIN 250 MG PO TABS: ORAL_TABLET | ORAL | 0 refills | Status: AC

## 2023-05-26 NOTE — Progress Notes (Signed)
 [facility] [address] [place], [place] [address]  Internal MEDICINE  Office Visit Note  Patient Name: [name] [name]  [id number]  [id number]  Date of Service: 05/26/2023  Chief Complaint  Patient presents with   Acute Visit    Poss sinus infection, negative covid     HPI [name] [name] presents for an acute sick visit for possible sinus infection --onset of symptoms was Sunday --reports headache, sinus pressure, nasal congestion, runny nose, fatigue --negative for covid   Current Medication:  Outpatient Encounter Medications as of 05/26/2023  Medication Sig   amLODipine (NORVASC) 5 MG tablet TAKE 1 TABLET (5 MG TOTAL) BY MOUTH DAILY.   aspirin 81 MG EC tablet Take 1 tablet by mouth daily.   augmented betamethasone dipropionate (DIPROLENE-AF) 0.05 % cream Apply topically 2 (two) times daily.   azithromycin (ZITHROMAX) 250 MG tablet Take 2 tablets on day 1, then 1 tablet daily on days 2 through 5   Biotin 2.5 MG CAPS Take 1 capsule by mouth daily.   Cetirizine HCl 10 MG CAPS Take 1 capsule by mouth daily.   Cholecalciferol 25 MCG (1000 UT) tablet Take 1 tablet by mouth 1 day or 1 dose.   Clobetasol Propionate 0.05 % shampoo USE AS DIRECTED TWICE WEEKLY AS NEEDED   clotrimazole-betamethasone (LOTRISONE) cream Apply 1 Application topically 2 (two) times daily.   Fezolinetant (VEOZAH) 45 MG TABS TAKE 1 TABLET BY MOUTH EVERY DAY   fluticasone (FLONASE) 50 MCG/ACT nasal spray PLACE 2 SPRAYS INTO BOTH NOSTRILS AS NEEDED.   memantine (NAMENDA) 10 MG tablet TAKE 1 TABLET BY MOUTH TWICE A DAY   mirabegron ER (MYRBETRIQ) 50 MG TB24 tablet Take 1 tablet (50 mg total) by mouth daily.   Multiple Vitamins-Minerals (CENTRUM SILVER) tablet Take 1 tablet by mouth daily.   ondansetron (ZOFRAN) 4 MG tablet Take 1 tablet (4 mg total) by mouth every 8 (eight) hours as needed for nausea or vomiting.   pantoprazole (PROTONIX) 40 MG tablet TAKE 1 TABLET BY MOUTH EVERY DAY   escitalopram  (LEXAPRO) 5 MG tablet Take 1 tablet by mouth daily.   No facility-administered encounter medications on file as of 05/26/2023.      Medical History: Past Medical History:  Diagnosis Date   Allergic rhinitis    Depression    GERD (gastroesophageal reflux disease)    Hypertension    Postmenopausal disorder      Vital Signs: BP 118/74   Pulse 85   Temp 98.4 F (36.9 C)   Resp 16   Ht 5\' 5"  (1.651 m)   Wt 147 lb 12.8 oz (67 kg)   LMP 08/09/2014   SpO2 99%   BMI 24.60 kg/m    Review of Systems  Constitutional:  Positive for fatigue. Negative for chills and fever.  HENT:  Positive for congestion, postnasal drip, rhinorrhea, sinus pressure and sinus pain. Negative for sore throat.   Respiratory:  Negative for cough, chest tightness, shortness of breath and wheezing.   Cardiovascular: Negative.  Negative for chest pain and palpitations.  Neurological:  Positive for headaches.    Physical Exam Vitals reviewed.  Constitutional:      Appearance: Normal appearance.  HENT:     Head: Normocephalic and atraumatic.     Right Ear: Tympanic membrane, ear canal and external ear normal.     Left Ear: Tympanic membrane, ear canal and external ear normal.     Nose: Congestion and rhinorrhea present.     Mouth/Throat:  Mouth: Mucous membranes are moist.     Pharynx: Posterior oropharyngeal erythema present.  Eyes:     Pupils: Pupils are equal, round, and reactive to light.  Cardiovascular:     Rate and Rhythm: Normal rate and regular rhythm.  Pulmonary:     Effort: Pulmonary effort is normal. No respiratory distress.  Neurological:     Mental Status: She is alert and oriented to person, place, and time.  Psychiatric:        Mood and Affect: Mood normal.        Behavior: Behavior normal.       Assessment/Plan: 1. Acute non-recurrent frontal sinusitis (Primary) Zpak prescribed, take until gone  - azithromycin (ZITHROMAX) 250 MG tablet; Take 2 tablets on day 1, then 1  tablet daily on days 2 through 5  Dispense: 6 tablet; Refill: 0  2. Nausea without vomiting Zofran prescribed as needed  - ondansetron (ZOFRAN) 4 MG tablet; Take 1 tablet (4 mg total) by mouth every 8 (eight) hours as needed for nausea or vomiting.  Dispense: 20 tablet; Refill: 0   General Counseling: [name] [name] verbalizes understanding of the findings of todays visit and agrees with plan of treatment. I have discussed any further diagnostic evaluation that may be needed or ordered today. We also reviewed her medications today. she has been encouraged to call the office with any questions or concerns that should arise related to todays visit.    Counseling:    No orders of the defined types were placed in this encounter.   Meds ordered this encounter  Medications   azithromycin (ZITHROMAX) 250 MG tablet    Sig: Take 2 tablets on day 1, then 1 tablet daily on days 2 through 5    Dispense:  6 tablet    Refill:  0    Fill new script today, not allergic, just has nausea with medication.   ondansetron (ZOFRAN) 4 MG tablet    Sig: Take 1 tablet (4 mg total) by mouth every 8 (eight) hours as needed for nausea or vomiting.    Dispense:  20 tablet    Refill:  0    Return if symptoms worsen or fail to improve.  [place] Controlled Substance Database was reviewed by me for overdose risk score (ORS)  Time spent:20 Minutes Time spent with patient included reviewing progress notes, labs, imaging studies, and discussing plan for follow up.   This patient was seen by Sallyanne Kuster, FNP-C in collaboration with Dr. Beverely Risen as a part of collaborative care agreement.  [name] Neville R. Tedd Sias, MSN, FNP-C Internal Medicine

## 2023-05-27 DIAGNOSIS — G309 Alzheimer's disease, unspecified: Secondary | ICD-10-CM | POA: Diagnosis not present

## 2023-05-27 DIAGNOSIS — F0670 Mild neurocognitive disorder due to known physiological condition without behavioral disturbance: Secondary | ICD-10-CM | POA: Diagnosis not present

## 2023-06-07 ENCOUNTER — Telehealth: Payer: Self-pay

## 2023-06-07 MED ORDER — DOXYCYCLINE HYCLATE 100 MG PO TABS: 100.0000 mg | ORAL_TABLET | Freq: Two times a day (BID) | ORAL | 0 refills | Status: AC

## 2023-06-07 NOTE — Telephone Encounter (Signed)
 Pt advised that we sent doxycycline if she is not feeling better need appt inperson

## 2023-06-10 ENCOUNTER — Other Ambulatory Visit: Payer: Self-pay | Admitting: Nurse Practitioner

## 2023-06-10 DIAGNOSIS — G309 Alzheimer's disease, unspecified: Secondary | ICD-10-CM | POA: Diagnosis not present

## 2023-06-10 DIAGNOSIS — F0670 Mild neurocognitive disorder due to known physiological condition without behavioral disturbance: Secondary | ICD-10-CM | POA: Diagnosis not present

## 2023-06-10 DIAGNOSIS — N3281 Overactive bladder: Secondary | ICD-10-CM

## 2023-06-24 DIAGNOSIS — G309 Alzheimer's disease, unspecified: Secondary | ICD-10-CM | POA: Diagnosis not present

## 2023-06-24 DIAGNOSIS — F0670 Mild neurocognitive disorder due to known physiological condition without behavioral disturbance: Secondary | ICD-10-CM | POA: Diagnosis not present

## 2023-07-05 ENCOUNTER — Encounter: Payer: Self-pay | Admitting: Nurse Practitioner

## 2023-07-08 DIAGNOSIS — G309 Alzheimer's disease, unspecified: Secondary | ICD-10-CM | POA: Diagnosis not present

## 2023-07-08 DIAGNOSIS — F0670 Mild neurocognitive disorder due to known physiological condition without behavioral disturbance: Secondary | ICD-10-CM | POA: Diagnosis not present

## 2023-07-19 DIAGNOSIS — L930 Discoid lupus erythematosus: Secondary | ICD-10-CM | POA: Diagnosis not present

## 2023-07-19 DIAGNOSIS — D485 Neoplasm of uncertain behavior of skin: Secondary | ICD-10-CM | POA: Diagnosis not present

## 2023-07-22 DIAGNOSIS — F0670 Mild neurocognitive disorder due to known physiological condition without behavioral disturbance: Secondary | ICD-10-CM | POA: Diagnosis not present

## 2023-07-22 DIAGNOSIS — G309 Alzheimer's disease, unspecified: Secondary | ICD-10-CM | POA: Diagnosis not present

## 2023-08-05 DIAGNOSIS — G309 Alzheimer's disease, unspecified: Secondary | ICD-10-CM | POA: Diagnosis not present

## 2023-08-05 DIAGNOSIS — F0670 Mild neurocognitive disorder due to known physiological condition without behavioral disturbance: Secondary | ICD-10-CM | POA: Diagnosis not present

## 2023-08-13 ENCOUNTER — Other Ambulatory Visit: Payer: Self-pay | Admitting: Nurse Practitioner

## 2023-08-13 MED ORDER — NITROFURANTOIN MONOHYD MACRO 100 MG PO CAPS: 100.0000 mg | ORAL_CAPSULE | Freq: Two times a day (BID) | ORAL | 0 refills | Status: AC

## 2023-08-13 MED ORDER — NITROFURANTOIN MONOHYD MACRO 100 MG PO CAPS: 100.0000 mg | ORAL_CAPSULE | Freq: Two times a day (BID) | ORAL | 0 refills | Status: DC

## 2023-08-13 NOTE — Addendum Note (Signed)
 Addended by: Charvis Lightner on: 08/13/2023 08:10 AM   Modules accepted: Orders

## 2023-08-19 DIAGNOSIS — F0670 Mild neurocognitive disorder due to known physiological condition without behavioral disturbance: Secondary | ICD-10-CM | POA: Diagnosis not present

## 2023-08-19 DIAGNOSIS — G309 Alzheimer's disease, unspecified: Secondary | ICD-10-CM | POA: Diagnosis not present

## 2023-09-02 DIAGNOSIS — G309 Alzheimer's disease, unspecified: Secondary | ICD-10-CM | POA: Diagnosis not present

## 2023-09-02 DIAGNOSIS — F0670 Mild neurocognitive disorder due to known physiological condition without behavioral disturbance: Secondary | ICD-10-CM | POA: Diagnosis not present

## 2023-09-07 ENCOUNTER — Encounter: Payer: Self-pay | Admitting: Nurse Practitioner

## 2023-09-07 ENCOUNTER — Ambulatory Visit (INDEPENDENT_AMBULATORY_CARE_PROVIDER_SITE_OTHER): Admitting: Nurse Practitioner

## 2023-09-07 VITALS — BP 136/72 | HR 75 | Temp 97.7°F | Resp 16 | Ht 65.0 in | Wt 145.6 lb

## 2023-09-07 DIAGNOSIS — R200 Anesthesia of skin: Secondary | ICD-10-CM | POA: Diagnosis not present

## 2023-09-07 DIAGNOSIS — G8929 Other chronic pain: Secondary | ICD-10-CM | POA: Diagnosis not present

## 2023-09-07 DIAGNOSIS — R202 Paresthesia of skin: Secondary | ICD-10-CM

## 2023-09-07 DIAGNOSIS — M25561 Pain in right knee: Secondary | ICD-10-CM

## 2023-09-07 DIAGNOSIS — M25562 Pain in left knee: Secondary | ICD-10-CM

## 2023-09-07 DIAGNOSIS — S8991XS Unspecified injury of right lower leg, sequela: Secondary | ICD-10-CM

## 2023-09-07 DIAGNOSIS — Z9181 History of falling: Secondary | ICD-10-CM | POA: Diagnosis not present

## 2023-09-07 DIAGNOSIS — S8992XS Unspecified injury of left lower leg, sequela: Secondary | ICD-10-CM

## 2023-09-07 NOTE — Progress Notes (Signed)
 [facility] [address] [place], [place] [address]  Internal MEDICINE  Office Visit Note  Patient Name: [name] [name]  [id number]  [id number]  Date of Service: 09/07/2023  Chief Complaint  Patient presents with   Acute Visit    Marvell Slider week after Christmas- still hurting, using heating pad. Tingling/ numbness outside lower legs.      HPI Raelea presents for an acute sick visit for numbness and tingling in the legs  Sterling on both knees back during christmas time.  Left knee was bruised worse than the right knee. The knees do not really hurt anymore but she is having numbness and tingling that radiates down the legs starting at the knees and stopping at the ankles. It is worse in the left leg. She experiences this while sitting, standing or walking.       Current Medication:  Outpatient Encounter Medications as of 09/07/2023  Medication Sig   amLODipine  (NORVASC ) 5 MG tablet TAKE 1 TABLET (5 MG TOTAL) BY MOUTH DAILY.   aspirin 81 MG EC tablet Take 1 tablet by mouth daily.   augmented betamethasone  dipropionate (DIPROLENE -AF) 0.05 % cream Apply topically 2 (two) times daily.   Biotin 2.5 MG CAPS Take 1 capsule by mouth daily.   Cetirizine  HCl 10 MG CAPS Take 1 capsule by mouth daily.   Cholecalciferol 25 MCG (1000 UT) tablet Take 1 tablet by mouth 1 day or 1 dose.   Clobetasol  Propionate 0.05 % shampoo USE AS DIRECTED TWICE WEEKLY AS NEEDED   clotrimazole -betamethasone  (LOTRISONE ) cream Apply 1 Application topically 2 (two) times daily.   Fezolinetant  (VEOZAH ) 45 MG TABS TAKE 1 TABLET BY MOUTH EVERY DAY   fluticasone  (FLONASE ) 50 MCG/ACT nasal spray PLACE 2 SPRAYS INTO BOTH NOSTRILS AS NEEDED.   memantine  (NAMENDA ) 10 MG tablet TAKE 1 TABLET BY MOUTH TWICE A DAY   Multiple Vitamins-Minerals (CENTRUM SILVER) tablet Take 1 tablet by mouth daily.   MYRBETRIQ  50 MG TB24 tablet TAKE 1 TABLET BY MOUTH EVERY DAY   ondansetron  (ZOFRAN ) 4 MG tablet Take 1 tablet (4 mg total)  by mouth every 8 (eight) hours as needed for nausea or vomiting.   pantoprazole  (PROTONIX ) 40 MG tablet TAKE 1 TABLET BY MOUTH EVERY DAY   escitalopram (LEXAPRO) 5 MG tablet Take 1 tablet by mouth daily.   No facility-administered encounter medications on file as of 09/07/2023.      Medical History: Past Medical History:  Diagnosis Date   Allergic rhinitis    Depression    GERD (gastroesophageal reflux disease)    Hypertension    Postmenopausal disorder      Vital Signs: BP 136/72   Pulse 75   Temp 97.7 F (36.5 C)   Resp 16   Ht 5\' 5"  (1.651 m)   Wt 145 lb 9.6 oz (66 kg)   LMP 08/09/2014   SpO2 99%   BMI 24.23 kg/m    Review of Systems  Constitutional:  Negative for fatigue.  Respiratory:  Negative for cough, chest tightness, shortness of breath and wheezing.   Cardiovascular: Negative.  Negative for chest pain and palpitations.  Musculoskeletal:  Positive for arthralgias (knee pain with numbness and tingling radiating down legs bilaterally).  Neurological:  Positive for numbness (lower extremities from knee to ankle).    Physical Exam Vitals reviewed.  Constitutional:      General: She is not in acute distress.    Appearance: Normal appearance. She is normal weight. She is not ill-appearing.  HENT:     Head: Normocephalic and atraumatic.  Eyes:     Pupils: Pupils are equal, round, and reactive to light.  Cardiovascular:     Rate and Rhythm: Normal rate and regular rhythm.  Pulmonary:     Effort: Pulmonary effort is normal. No respiratory distress.  Musculoskeletal:        General: Signs of injury (bilateral knees) present.     Comments: Numbness and tingling down both legs from knees to ankles.   Neurological:     Mental Status: She is alert and oriented to person, place, and time.  Psychiatric:        Mood and Affect: Mood normal.        Behavior: Behavior normal.       Assessment/Plan: 1. Numbness and tingling of both legs below knees  (Primary) Referred to [facility] - Ambulatory referral to Orthopedic Surgery  2. Chronic pain of both knees Referred to [facility] - Ambulatory referral to Orthopedic Surgery  3. Knee injury, right, sequela Referred to [facility] - Ambulatory referral to Orthopedic Surgery  4. Left knee injury, sequela Referred to [facility] - Ambulatory referral to Orthopedic Surgery  5. History of fall Referred to [facility] surgery  - Ambulatory referral to Orthopedic Surgery   General Counseling: Jere Monaco understanding of the findings of todays visit and agrees with plan of treatment. I have discussed any further diagnostic evaluation that may be needed or ordered today. We also reviewed her medications today. she has been encouraged to call the office with any questions or concerns that should arise related to todays visit.    Counseling:    Orders Placed This Encounter  Procedures   Ambulatory referral to Orthopedic Surgery    No orders of the defined types were placed in this encounter.   Return if symptoms worsen or fail to improve, for referred to orthopedic surgery .  [place] Controlled Substance Database was reviewed by me for overdose risk score (ORS)  Time spent:30 Minutes Time spent with patient included reviewing progress notes, labs, imaging studies, and discussing plan for follow up.   This patient was seen by Laurence Pons, FNP-C in collaboration with Dr. Verneta Gone as a part of collaborative care agreement.  Bill Mcvey R. Bobbi Burow, MSN, FNP-C Internal Medicine

## 2023-09-08 ENCOUNTER — Telehealth: Payer: Self-pay | Admitting: Nurse Practitioner

## 2023-09-08 NOTE — Telephone Encounter (Signed)
 Urgent Orthopedic referral sent via [facility] to [facility].  Notified patient. Gave pt telephone# [phone]-[name]

## 2023-09-13 ENCOUNTER — Telehealth: Payer: Self-pay | Admitting: Nurse Practitioner

## 2023-09-13 NOTE — Telephone Encounter (Signed)
 Orthopedic appointment 09/29/2023 @ [facility]-[name]

## 2023-09-15 ENCOUNTER — Other Ambulatory Visit: Payer: Self-pay | Admitting: Nurse Practitioner

## 2023-09-16 DIAGNOSIS — G309 Alzheimer's disease, unspecified: Secondary | ICD-10-CM | POA: Diagnosis not present

## 2023-09-16 DIAGNOSIS — F0670 Mild neurocognitive disorder due to known physiological condition without behavioral disturbance: Secondary | ICD-10-CM | POA: Diagnosis not present

## 2023-09-20 DIAGNOSIS — Z961 Presence of intraocular lens: Secondary | ICD-10-CM | POA: Diagnosis not present

## 2023-09-23 ENCOUNTER — Other Ambulatory Visit: Payer: Self-pay | Admitting: Nurse Practitioner

## 2023-09-29 DIAGNOSIS — G5732 Lesion of lateral popliteal nerve, left lower limb: Secondary | ICD-10-CM | POA: Diagnosis not present

## 2023-09-30 DIAGNOSIS — Z006 Encounter for examination for normal comparison and control in clinical research program: Secondary | ICD-10-CM | POA: Diagnosis not present

## 2023-09-30 DIAGNOSIS — G309 Alzheimer's disease, unspecified: Secondary | ICD-10-CM | POA: Diagnosis not present

## 2023-09-30 DIAGNOSIS — F0670 Mild neurocognitive disorder due to known physiological condition without behavioral disturbance: Secondary | ICD-10-CM | POA: Diagnosis not present

## 2023-10-03 DIAGNOSIS — G3184 Mild cognitive impairment, so stated: Secondary | ICD-10-CM | POA: Diagnosis not present

## 2023-10-07 ENCOUNTER — Ambulatory Visit: Payer: Medicare Other | Admitting: Nurse Practitioner

## 2023-10-07 DIAGNOSIS — F0670 Mild neurocognitive disorder due to known physiological condition without behavioral disturbance: Secondary | ICD-10-CM | POA: Diagnosis not present

## 2023-10-07 DIAGNOSIS — G309 Alzheimer's disease, unspecified: Secondary | ICD-10-CM | POA: Diagnosis not present

## 2023-10-14 ENCOUNTER — Other Ambulatory Visit: Payer: Self-pay | Admitting: Nurse Practitioner

## 2023-10-14 DIAGNOSIS — N3281 Overactive bladder: Secondary | ICD-10-CM

## 2023-10-14 DIAGNOSIS — Z006 Encounter for examination for normal comparison and control in clinical research program: Secondary | ICD-10-CM | POA: Diagnosis not present

## 2023-10-14 DIAGNOSIS — F0670 Mild neurocognitive disorder due to known physiological condition without behavioral disturbance: Secondary | ICD-10-CM | POA: Diagnosis not present

## 2023-10-14 DIAGNOSIS — G309 Alzheimer's disease, unspecified: Secondary | ICD-10-CM | POA: Diagnosis not present

## 2023-10-15 ENCOUNTER — Encounter: Payer: Self-pay | Admitting: Internal Medicine

## 2023-10-22 ENCOUNTER — Telehealth: Payer: Self-pay

## 2023-10-22 NOTE — Telephone Encounter (Signed)
 Pt called she having sinus pressure no other symptoms as per DFK advised that she take OTC tylenol cold and sinus if not feeling better go to Urgent care  or call us  back

## 2023-10-28 DIAGNOSIS — F0670 Mild neurocognitive disorder due to known physiological condition without behavioral disturbance: Secondary | ICD-10-CM | POA: Diagnosis not present

## 2023-10-28 DIAGNOSIS — G309 Alzheimer's disease, unspecified: Secondary | ICD-10-CM | POA: Diagnosis not present

## 2023-10-28 DIAGNOSIS — Z006 Encounter for examination for normal comparison and control in clinical research program: Secondary | ICD-10-CM | POA: Diagnosis not present

## 2023-10-29 ENCOUNTER — Other Ambulatory Visit: Payer: Self-pay

## 2023-10-29 ENCOUNTER — Telehealth: Payer: Self-pay

## 2023-10-29 MED ORDER — AZITHROMYCIN 250 MG PO TABS: ORAL_TABLET | ORAL | 0 refills | Status: AC

## 2023-10-29 NOTE — Telephone Encounter (Signed)
 Pt called  that she is not feeling better with OTC med she is still sinus infection as per alyssa sent ZPak  and also delete ZPak allergic from allergies list as per pt she not allergic with ZPak it was error and she just had ZPak in February and she don't have any allergic reaction

## 2023-11-02 ENCOUNTER — Telehealth: Payer: Self-pay | Admitting: Nurse Practitioner

## 2023-11-02 NOTE — Telephone Encounter (Signed)
 Lvm to r/s 10/07/23 cancelled wellness visit-[name]

## 2023-11-08 ENCOUNTER — Telehealth: Payer: Self-pay | Admitting: Nurse Practitioner

## 2023-11-08 NOTE — Telephone Encounter (Signed)
 Lvm & sent message to r/s 10/07/23 cancelled appointment-[name]

## 2023-11-11 DIAGNOSIS — G309 Alzheimer's disease, unspecified: Secondary | ICD-10-CM | POA: Diagnosis not present

## 2023-11-11 DIAGNOSIS — Z006 Encounter for examination for normal comparison and control in clinical research program: Secondary | ICD-10-CM | POA: Diagnosis not present

## 2023-11-11 DIAGNOSIS — F0670 Mild neurocognitive disorder due to known physiological condition without behavioral disturbance: Secondary | ICD-10-CM | POA: Diagnosis not present

## 2023-11-15 ENCOUNTER — Telehealth: Payer: Self-pay | Admitting: Nurse Practitioner

## 2023-11-15 NOTE — Telephone Encounter (Signed)
 No reply from patient to r/s 10/07/23 cancelled wellness visit. Letter mailed-[name]

## 2023-11-16 ENCOUNTER — Other Ambulatory Visit: Payer: Self-pay | Admitting: Internal Medicine

## 2023-11-16 DIAGNOSIS — Z1231 Encounter for screening mammogram for malignant neoplasm of breast: Secondary | ICD-10-CM

## 2023-11-18 DIAGNOSIS — R739 Hyperglycemia, unspecified: Secondary | ICD-10-CM | POA: Diagnosis not present

## 2023-11-18 DIAGNOSIS — G3184 Mild cognitive impairment, so stated: Secondary | ICD-10-CM | POA: Diagnosis not present

## 2023-11-18 DIAGNOSIS — Z79899 Other long term (current) drug therapy: Secondary | ICD-10-CM | POA: Diagnosis not present

## 2023-11-18 DIAGNOSIS — Z0000 Encounter for general adult medical examination without abnormal findings: Secondary | ICD-10-CM | POA: Diagnosis not present

## 2023-11-18 DIAGNOSIS — Z1231 Encounter for screening mammogram for malignant neoplasm of breast: Secondary | ICD-10-CM | POA: Diagnosis not present

## 2023-11-18 DIAGNOSIS — Z1211 Encounter for screening for malignant neoplasm of colon: Secondary | ICD-10-CM | POA: Diagnosis not present

## 2023-11-25 DIAGNOSIS — F0670 Mild neurocognitive disorder due to known physiological condition without behavioral disturbance: Secondary | ICD-10-CM | POA: Diagnosis not present

## 2023-11-25 DIAGNOSIS — G309 Alzheimer's disease, unspecified: Secondary | ICD-10-CM | POA: Diagnosis not present

## 2023-11-25 DIAGNOSIS — Z006 Encounter for examination for normal comparison and control in clinical research program: Secondary | ICD-10-CM | POA: Diagnosis not present

## 2023-11-30 ENCOUNTER — Telehealth: Payer: Self-pay | Admitting: Nurse Practitioner

## 2023-11-30 ENCOUNTER — Telehealth: Payer: Self-pay

## 2023-11-30 NOTE — Telephone Encounter (Signed)
 Patient notified office that she has changed pcps,

## 2023-11-30 NOTE — Telephone Encounter (Signed)
 Pt called that she change her PCP

## 2023-12-26 ENCOUNTER — Other Ambulatory Visit: Payer: Self-pay | Admitting: Nurse Practitioner

## 2023-12-29 DIAGNOSIS — R55 Syncope and collapse: Secondary | ICD-10-CM | POA: Diagnosis not present

## 2023-12-29 DIAGNOSIS — R5383 Other fatigue: Secondary | ICD-10-CM | POA: Diagnosis not present

## 2023-12-30 DIAGNOSIS — I2089 Other forms of angina pectoris: Secondary | ICD-10-CM | POA: Diagnosis not present

## 2024-01-05 DIAGNOSIS — R9430 Abnormal result of cardiovascular function study, unspecified: Secondary | ICD-10-CM | POA: Diagnosis not present

## 2024-01-05 DIAGNOSIS — R9439 Abnormal result of other cardiovascular function study: Secondary | ICD-10-CM | POA: Diagnosis not present

## 2024-01-05 DIAGNOSIS — I10 Essential (primary) hypertension: Secondary | ICD-10-CM | POA: Diagnosis not present

## 2024-01-05 DIAGNOSIS — R5383 Other fatigue: Secondary | ICD-10-CM | POA: Diagnosis not present

## 2024-01-06 ENCOUNTER — Other Ambulatory Visit: Payer: Self-pay | Admitting: Physician Assistant

## 2024-01-06 DIAGNOSIS — R5383 Other fatigue: Secondary | ICD-10-CM

## 2024-01-06 DIAGNOSIS — R9439 Abnormal result of other cardiovascular function study: Secondary | ICD-10-CM

## 2024-01-06 DIAGNOSIS — R9430 Abnormal result of cardiovascular function study, unspecified: Secondary | ICD-10-CM

## 2024-01-06 DIAGNOSIS — I10 Essential (primary) hypertension: Secondary | ICD-10-CM

## 2024-01-11 ENCOUNTER — Telehealth (HOSPITAL_COMMUNITY): Payer: Self-pay | Admitting: Emergency Medicine

## 2024-01-11 NOTE — Telephone Encounter (Signed)
 Attempted to call patient regarding upcoming cardiac CT appointment. Left message on voicemail with name and callback number Rockwell Alexandria RN Navigator Cardiac Imaging [facility] [phone] Office [phone] Cell

## 2024-01-11 NOTE — Telephone Encounter (Signed)
 Reaching out to patient to offer assistance regarding upcoming cardiac imaging study; pt verbalizes understanding of appt date/time, parking situation and where to check in, pre-test NPO status and medications ordered, and verified current allergies; name and call back number provided for further questions should they arise Rockwell Alexandria RN Navigator Cardiac Imaging [facility] [phone] office [phone] cell

## 2024-01-12 ENCOUNTER — Ambulatory Visit (HOSPITAL_COMMUNITY)
Admission: RE | Admit: 2024-01-12 | Discharge: 2024-01-12 | Disposition: A | Discharge status: Home / Self Care | Source: Ambulatory Visit | Attending: Physician Assistant | Admitting: Physician Assistant

## 2024-01-12 DIAGNOSIS — R5383 Other fatigue: Secondary | ICD-10-CM | POA: Insufficient documentation

## 2024-01-12 DIAGNOSIS — I10 Essential (primary) hypertension: Secondary | ICD-10-CM | POA: Insufficient documentation

## 2024-01-12 DIAGNOSIS — R9430 Abnormal result of cardiovascular function study, unspecified: Secondary | ICD-10-CM | POA: Insufficient documentation

## 2024-01-12 DIAGNOSIS — R9439 Abnormal result of other cardiovascular function study: Secondary | ICD-10-CM | POA: Diagnosis not present

## 2024-01-12 DIAGNOSIS — I700 Atherosclerosis of aorta: Secondary | ICD-10-CM | POA: Diagnosis not present

## 2024-01-12 MED ORDER — IOHEXOL 350 MG/ML SOLN
100.0000 mL | Freq: Once | INTRAVENOUS | Status: AC | PRN
  Administered 2024-01-12: 100 mL via INTRAVENOUS

## 2024-01-12 MED ORDER — NITROGLYCERIN 0.4 MG SL SUBL
0.8000 mg | SUBLINGUAL_TABLET | Freq: Once | SUBLINGUAL | Status: AC
  Administered 2024-01-12: 0.8 mg via SUBLINGUAL

## 2024-01-18 ENCOUNTER — Ambulatory Visit
Admission: RE | Admit: 2024-01-18 | Discharge: 2024-01-18 | Disposition: A | Discharge status: Home / Self Care | Source: Ambulatory Visit | Attending: Internal Medicine | Admitting: Internal Medicine

## 2024-01-18 DIAGNOSIS — I10 Essential (primary) hypertension: Secondary | ICD-10-CM | POA: Diagnosis not present

## 2024-01-18 DIAGNOSIS — R9439 Abnormal result of other cardiovascular function study: Secondary | ICD-10-CM | POA: Diagnosis not present

## 2024-01-18 DIAGNOSIS — Z1231 Encounter for screening mammogram for malignant neoplasm of breast: Secondary | ICD-10-CM | POA: Insufficient documentation

## 2024-01-19 DIAGNOSIS — R5383 Other fatigue: Secondary | ICD-10-CM | POA: Diagnosis not present

## 2024-01-19 DIAGNOSIS — R232 Flushing: Secondary | ICD-10-CM | POA: Diagnosis not present

## 2024-01-24 ENCOUNTER — Other Ambulatory Visit
